# Patient Record
Sex: Male | Born: 1980 | Race: White | Hispanic: No | Marital: Single | Smoking: Former smoker
Health system: Southern US, Community
[De-identification: ages and names within clinical notes are randomized; demographics above are authoritative.]

## PROBLEM LIST (undated history)

## (undated) DIAGNOSIS — F329 Major depressive disorder, single episode, unspecified: Secondary | ICD-10-CM

## (undated) DIAGNOSIS — F32A Depression, unspecified: Secondary | ICD-10-CM

## (undated) DIAGNOSIS — K589 Irritable bowel syndrome without diarrhea: Secondary | ICD-10-CM

## (undated) DIAGNOSIS — K219 Gastro-esophageal reflux disease without esophagitis: Secondary | ICD-10-CM

## (undated) DIAGNOSIS — F988 Other specified behavioral and emotional disorders with onset usually occurring in childhood and adolescence: Secondary | ICD-10-CM

## (undated) DIAGNOSIS — T7840XA Allergy, unspecified, initial encounter: Secondary | ICD-10-CM

## (undated) DIAGNOSIS — L709 Acne, unspecified: Secondary | ICD-10-CM

## (undated) DIAGNOSIS — I10 Essential (primary) hypertension: Secondary | ICD-10-CM

## (undated) DIAGNOSIS — F909 Attention-deficit hyperactivity disorder, unspecified type: Secondary | ICD-10-CM

## (undated) DIAGNOSIS — F419 Anxiety disorder, unspecified: Secondary | ICD-10-CM

## (undated) DIAGNOSIS — F319 Bipolar disorder, unspecified: Secondary | ICD-10-CM

## (undated) HISTORY — DX: Irritable bowel syndrome, unspecified: K58.9

## (undated) HISTORY — DX: Allergy, unspecified, initial encounter: T78.40XA

## (undated) HISTORY — PX: WISDOM TOOTH EXTRACTION: SHX21

## (undated) HISTORY — DX: Essential (primary) hypertension: I10

## (undated) HISTORY — DX: Acne, unspecified: L70.9

## (undated) HISTORY — DX: Other specified behavioral and emotional disorders with onset usually occurring in childhood and adolescence: F98.8

---
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
## 2006-01-22 ENCOUNTER — Ambulatory Visit: Payer: Self-pay | Admitting: Family Medicine

## 2006-03-06 ENCOUNTER — Ambulatory Visit: Payer: Self-pay | Admitting: Family Medicine

## 2006-04-09 ENCOUNTER — Ambulatory Visit: Payer: Self-pay | Admitting: Family Medicine

## 2007-06-02 ENCOUNTER — Ambulatory Visit: Payer: Self-pay | Admitting: Family Medicine

## 2007-07-06 ENCOUNTER — Ambulatory Visit: Payer: Self-pay | Admitting: Family Medicine

## 2007-07-20 ENCOUNTER — Ambulatory Visit: Payer: Self-pay | Admitting: Family Medicine

## 2007-08-10 ENCOUNTER — Ambulatory Visit: Payer: Self-pay | Admitting: Family Medicine

## 2007-09-17 ENCOUNTER — Ambulatory Visit: Payer: Self-pay | Admitting: Family Medicine

## 2008-03-21 ENCOUNTER — Ambulatory Visit: Payer: Self-pay | Admitting: Family Medicine

## 2008-12-21 ENCOUNTER — Ambulatory Visit: Payer: Self-pay | Admitting: Family Medicine

## 2010-02-07 ENCOUNTER — Emergency Department (HOSPITAL_COMMUNITY): Admission: EM | Admit: 2010-02-07 | Discharge: 2010-02-07 | Payer: Self-pay | Admitting: Emergency Medicine

## 2010-06-17 ENCOUNTER — Emergency Department (HOSPITAL_COMMUNITY)
Admission: EM | Admit: 2010-06-17 | Discharge: 2010-06-17 | Payer: Self-pay | Source: Home / Self Care | Admitting: Emergency Medicine

## 2010-08-03 ENCOUNTER — Encounter: Payer: Self-pay | Admitting: Gastroenterology

## 2010-09-24 LAB — POCT I-STAT, CHEM 8
Creatinine, Ser: 1.1 mg/dL (ref 0.4–1.5)
Glucose, Bld: 80 mg/dL (ref 70–99)
Hemoglobin: 16.7 g/dL (ref 13.0–17.0)
Potassium: 3.8 mEq/L (ref 3.5–5.1)
TCO2: 26 mmol/L (ref 0–100)

## 2010-09-24 LAB — DIFFERENTIAL
Eosinophils Absolute: 0.2 10*3/uL (ref 0.0–0.7)
Eosinophils Relative: 1 % (ref 0–5)
Lymphs Abs: 3 10*3/uL (ref 0.7–4.0)
Monocytes Absolute: 1.1 10*3/uL — ABNORMAL HIGH (ref 0.1–1.0)
Monocytes Relative: 9 % (ref 3–12)

## 2010-09-24 LAB — URINALYSIS, ROUTINE W REFLEX MICROSCOPIC
Ketones, ur: >80 mg/dL — AB
Nitrite: NEGATIVE
pH: 6.5 (ref 5.0–8.0)

## 2010-09-24 LAB — RAPID URINE DRUG SCREEN, HOSP PERFORMED: Cocaine: NOT DETECTED

## 2010-09-24 LAB — BASIC METABOLIC PANEL
CO2: 24 mEq/L (ref 19–32)
Chloride: 98 mEq/L (ref 96–112)
GFR calc Af Amer: >60 mL/min (ref >60)
Sodium: 138 mEq/L (ref 135–145)

## 2010-09-24 LAB — CBC
Hemoglobin: 15.9 g/dL (ref 13.0–17.0)
MCH: 30.6 pg (ref 26.0–34.0)
MCV: 88.1 fL (ref 78.0–100.0)
RBC: 5.19 MIL/uL (ref 4.22–5.81)

## 2011-02-04 ENCOUNTER — Encounter: Payer: Self-pay | Admitting: Family Medicine

## 2011-02-06 ENCOUNTER — Ambulatory Visit (INDEPENDENT_AMBULATORY_CARE_PROVIDER_SITE_OTHER): Payer: BC Managed Care – PPO | Admitting: Family Medicine

## 2011-02-06 ENCOUNTER — Encounter: Payer: Self-pay | Admitting: Family Medicine

## 2011-02-06 VITALS — BP 124/80 | HR 78 | Wt 241.0 lb

## 2011-02-06 DIAGNOSIS — K219 Gastro-esophageal reflux disease without esophagitis: Secondary | ICD-10-CM

## 2011-02-06 DIAGNOSIS — L299 Pruritus, unspecified: Secondary | ICD-10-CM

## 2011-02-06 NOTE — Patient Instructions (Signed)
Use of Prilosec as needed to stretch it out as much as she can. If he requires use on a daily basis, then come back. Use cortisone cream for the itching in your left ear

## 2011-02-06 NOTE — Progress Notes (Signed)
  Subjective:    Patient ID: Herbert Bryant, male    DOB: [birth year], 30 y.o.   MRN: [id number]  HPI He has a several month history of itching in his left ear. No other areas itch. No pain or drainage, fever or chills, sore throat. He also has been having heartburn symptoms and has been taking Prilosec for the last 2 weeks . His reflux symptoms are now gone..   Review of Systems     Objective:   Physical Exam Alert and in no distress. The external canals bilaterally as well as external ear is normal. TMs normal. No preauricular nodes noted. Abdominal exam shows normal bowel sounds without masses or tenderness.       Assessment & Plan     pruritus of left ear right is of left ear   Pruritis of left ear. GERD. Recommend cortisone cream for the ear. Continue on Prilosec as needed. If continued difficulty, he will return here for reevaluation.

## 2011-05-06 IMAGING — CT CT HEAD W/O CM
1 series · 16 of 30 positions shown, 20 images · non-contrast
Comparison: None

CLINICAL DATA: Confusion.

CT HEAD WITHOUT CONTRAST
TECHNIQUE: Contiguous axial images were obtained from the base of
the skull through the vertex without contrast.

[Series 2: head_seq 4.5 h37s st · axial · 0.43mm/px · z∈[-127,+17]mm · 16 of 36 slices shown, 20 images]
[im 2/36  brain]
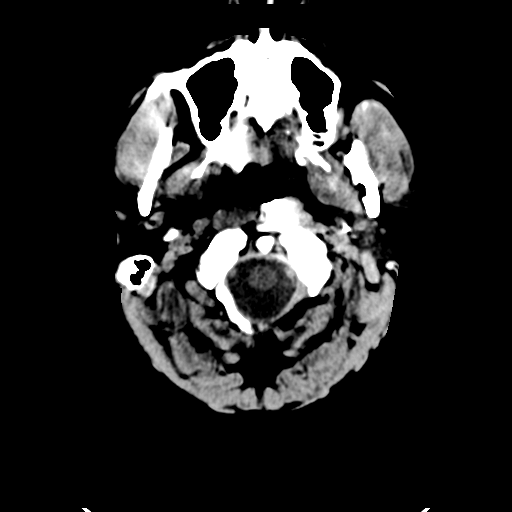
[im 2/36  bone]
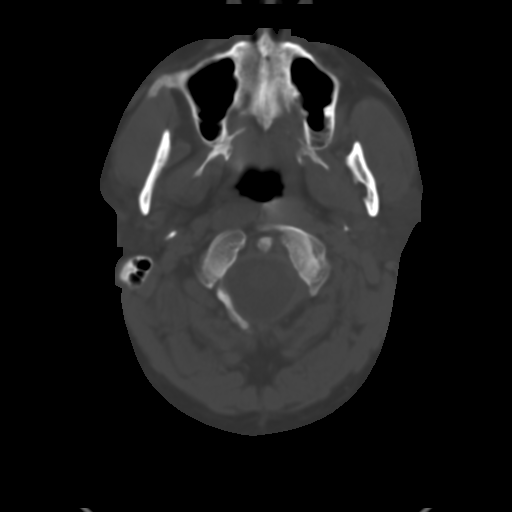
[im 4/36  brain]
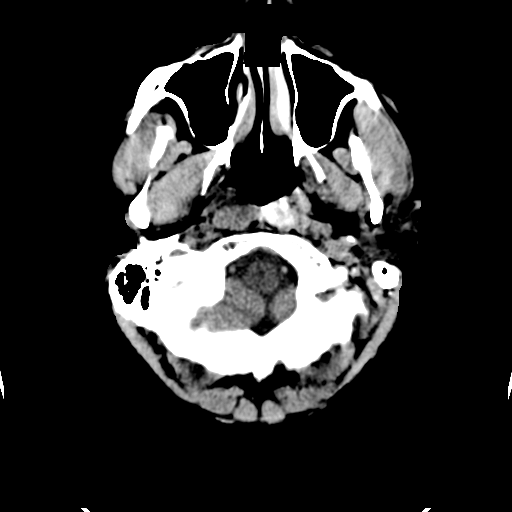
[im 7/36  brain]
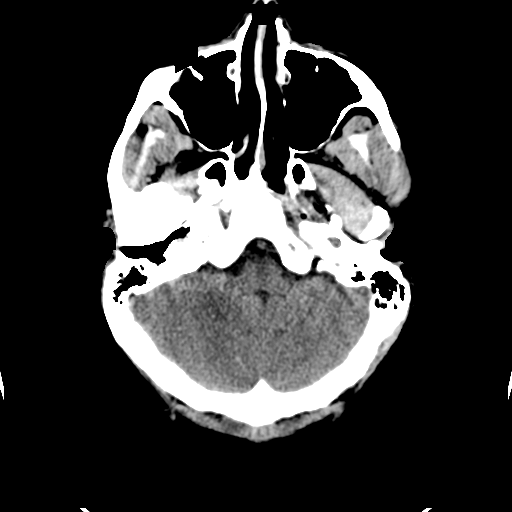
[im 9/36  brain]
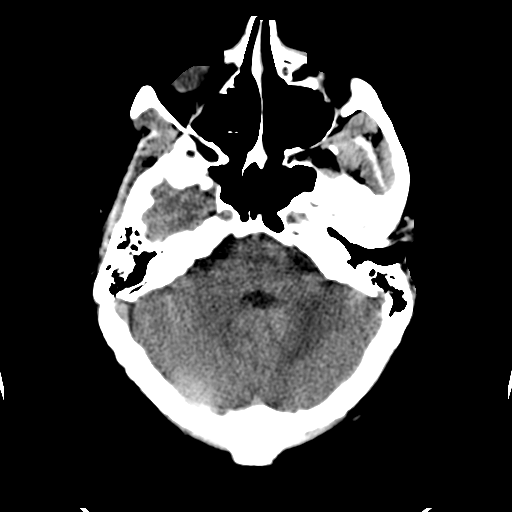
[im 10/36  brain]
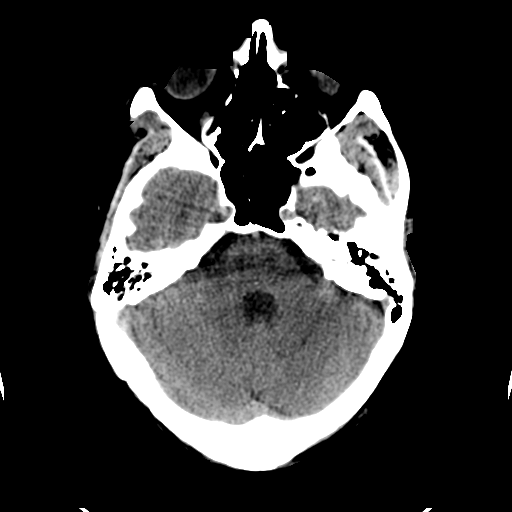
[im 10/36  bone]
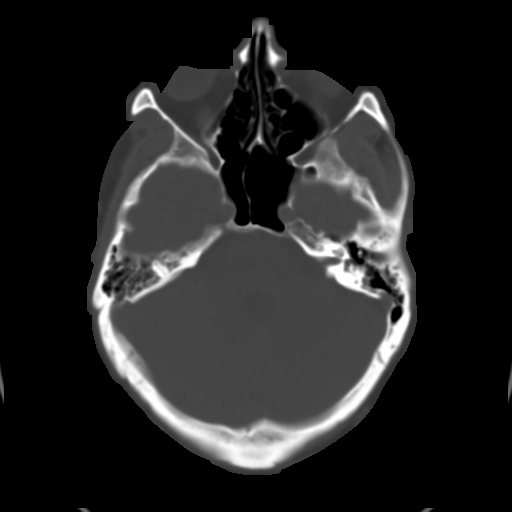
[im 13/36  brain]
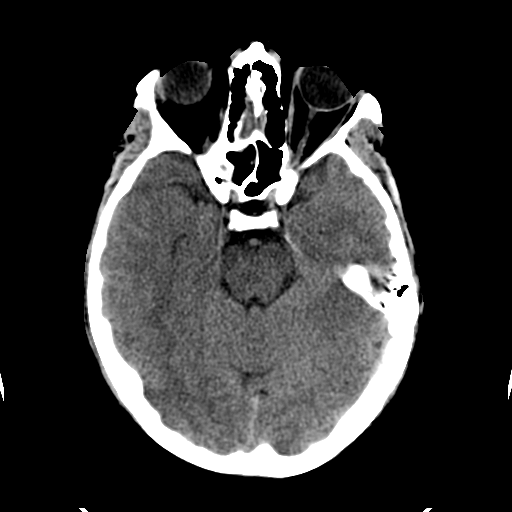
[im 15/36  brain]
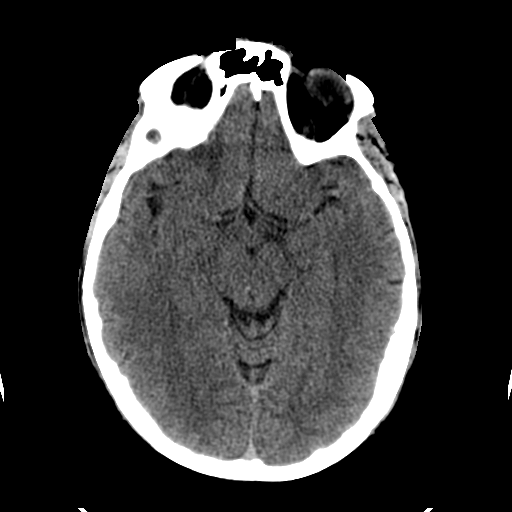
[im 17/36  brain]
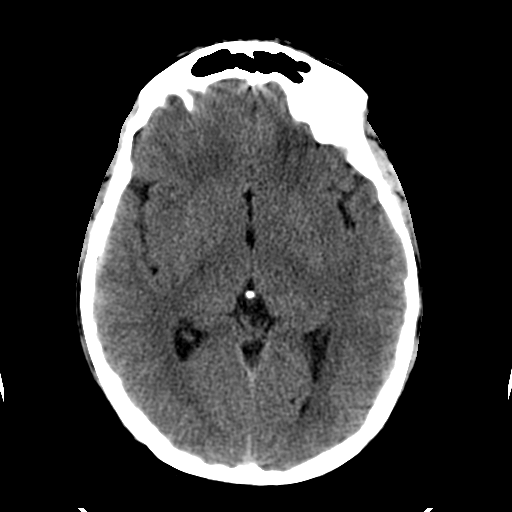
[im 19/36  brain]
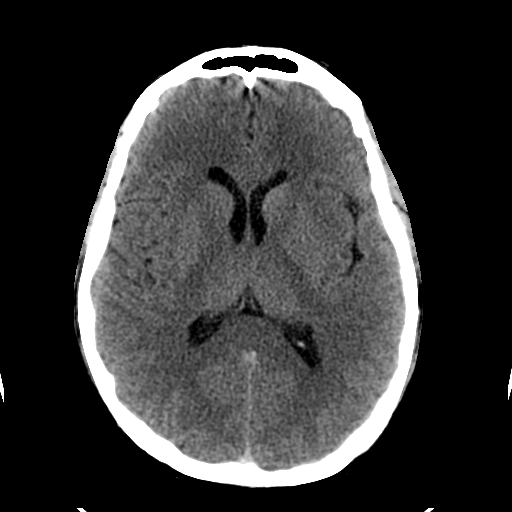
[im 19/36  bone]
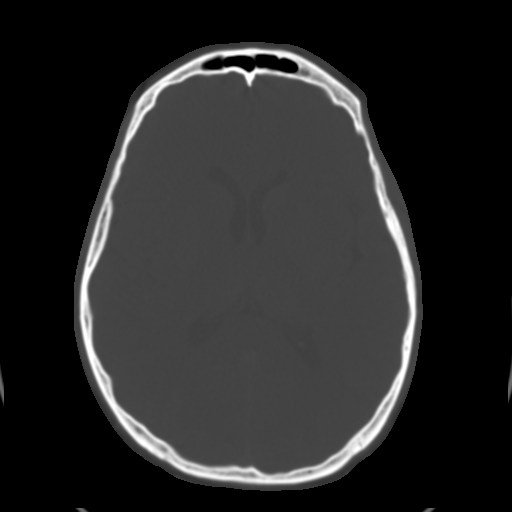
[im 21/36  brain]
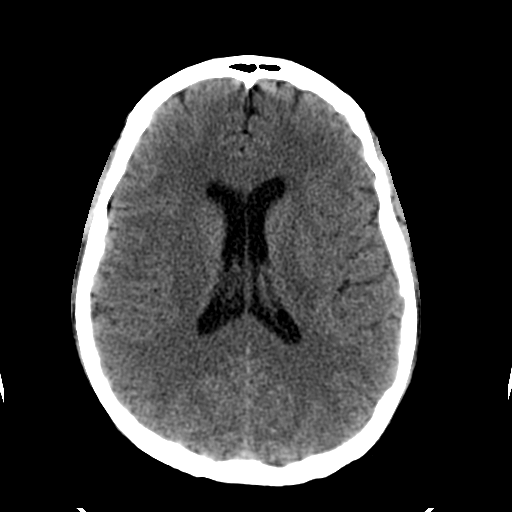
[im 23/36  brain]
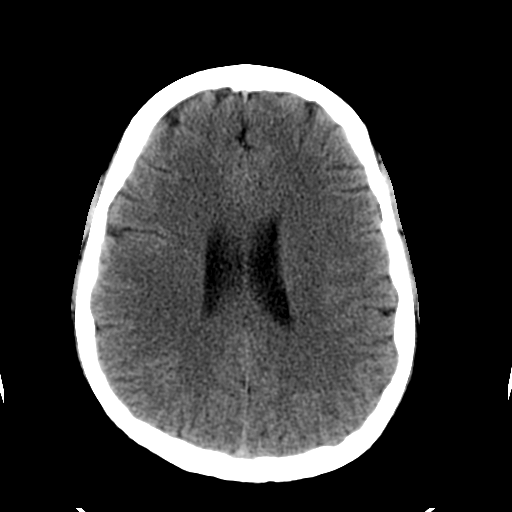
[im 26/36  brain]
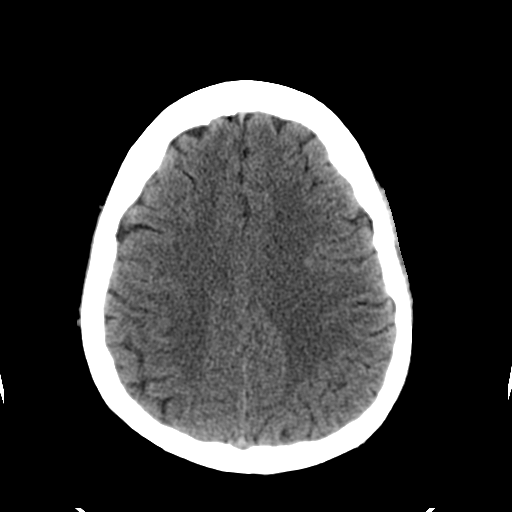
[im 27/36  brain]
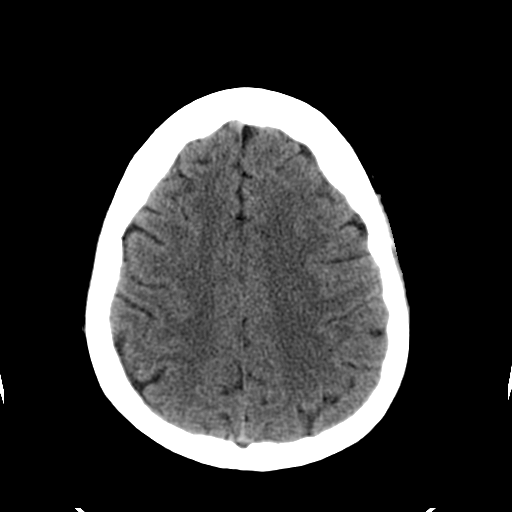
[im 27/36  bone]
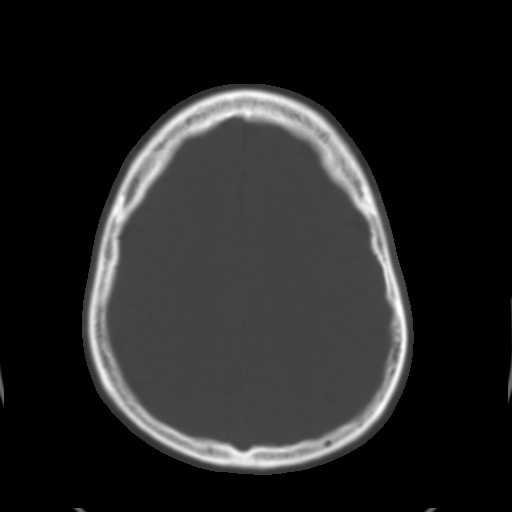
[im 29/36  brain]
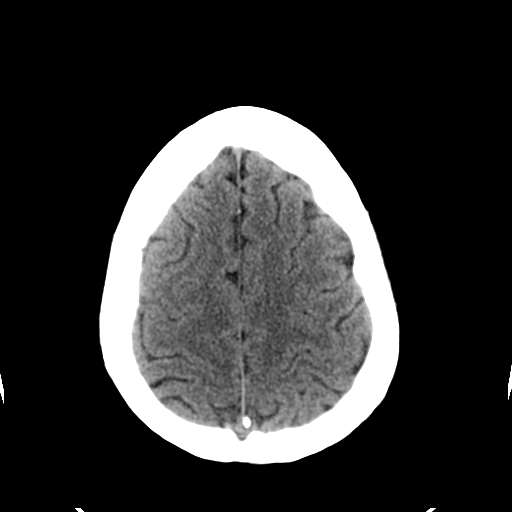
[im 32/36  brain]
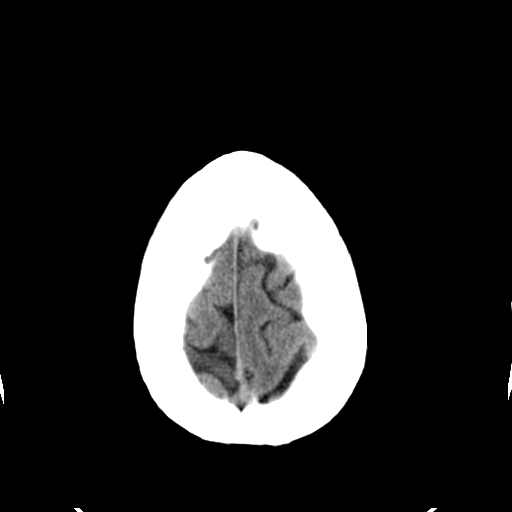
[im 34/36  brain]
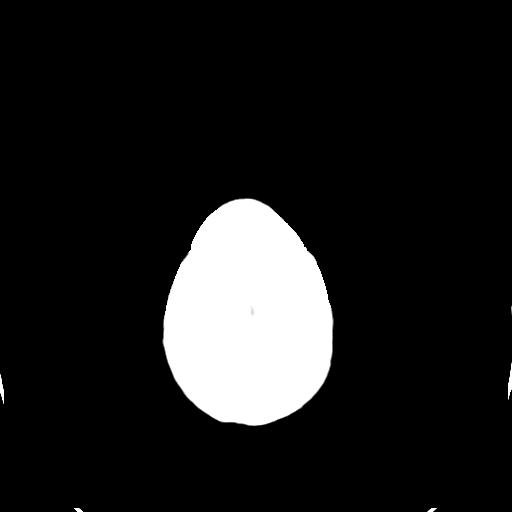

[16 of 30 positions shown; findings below may reference images not displayed]

FINDINGS: Ventricle size is normal.  Negative for hemorrhage.
Negative for infarct or mass lesion.  Negative for brain edema.
Calvarium is intact.
IMPRESSION: Normal

## 2011-05-29 ENCOUNTER — Encounter (HOSPITAL_COMMUNITY): Payer: Self-pay | Admitting: Emergency Medicine

## 2011-05-29 ENCOUNTER — Emergency Department (HOSPITAL_COMMUNITY)
Admission: EM | Admit: 2011-05-29 | Discharge: 2011-05-29 | Disposition: A | Discharge status: Home / Self Care | Payer: BC Managed Care – PPO | Attending: Emergency Medicine | Admitting: Emergency Medicine

## 2011-05-29 DIAGNOSIS — Z79899 Other long term (current) drug therapy: Secondary | ICD-10-CM | POA: Insufficient documentation

## 2011-05-29 DIAGNOSIS — R000 Tachycardia, unspecified: Secondary | ICD-10-CM

## 2011-05-29 DIAGNOSIS — F988 Other specified behavioral and emotional disorders with onset usually occurring in childhood and adolescence: Secondary | ICD-10-CM | POA: Insufficient documentation

## 2011-05-29 DIAGNOSIS — R079 Chest pain, unspecified: Secondary | ICD-10-CM | POA: Insufficient documentation

## 2011-05-29 DIAGNOSIS — K589 Irritable bowel syndrome without diarrhea: Secondary | ICD-10-CM | POA: Insufficient documentation

## 2011-05-29 DIAGNOSIS — R209 Unspecified disturbances of skin sensation: Secondary | ICD-10-CM | POA: Insufficient documentation

## 2011-05-29 HISTORY — DX: Attention-deficit hyperactivity disorder, unspecified type: F90.9

## 2011-05-29 LAB — RAPID URINE DRUG SCREEN, HOSP PERFORMED
Barbiturates: NOT DETECTED
Benzodiazepines: NOT DETECTED

## 2011-05-29 MED ORDER — METOPROLOL TARTRATE 25 MG PO TABS: 25.0000 mg | ORAL_TABLET | Freq: Two times a day (BID) | ORAL | Status: DC | PRN

## 2011-05-29 MED ORDER — SODIUM CHLORIDE 0.9 % IV BOLUS (SEPSIS)
500.0000 mL | Freq: Once | INTRAVENOUS | Status: AC
  Administered 2011-05-29: 500 mL via INTRAVENOUS

## 2011-05-29 MED ORDER — SODIUM CHLORIDE 0.9 % IV BOLUS (SEPSIS)
1000.0000 mL | Freq: Once | INTRAVENOUS | Status: AC
  Administered 2011-05-29: 1000 mL via INTRAVENOUS

## 2011-05-29 NOTE — ED Notes (Signed)
Patient states that he is not interested in having blood drawn right now

## 2011-05-29 NOTE — ED Notes (Signed)
Father asked for a mouth swab for patient. Informed the father that we will bring him a mouth swab and draw his blood. Father states that he wanted the swab but we will NOT draw his blood.

## 2011-05-29 NOTE — ED Provider Notes (Signed)
Medical screening examination/treatment/procedure(s) were conducted as a shared visit with the resident physician and myself.  I personally evaluated the patient during the encounter and find him to have a normal appearance, normal affect, alert and oriented appropriately, with no respiratory distress, clear lung sounds in all fields, normal heart rate and rhythm with normal heart sounds, no murmurs, rubs, or gallops.  I have reviewed the resident's note and agree with the findings and plan.  Felisa Bonier, MD 05/29/11 [phone]

## 2011-05-29 NOTE — ED Notes (Signed)
Informed Dr Dorise Hiss that the patient will not let us obtain blood or urine and she states that it was ok

## 2011-05-29 NOTE — ED Notes (Signed)
Attempted to obtain blood and urine from pt again. Pt refused urinal and catheter and refuses to allow nurse to stick for blood again.

## 2011-05-29 NOTE — ED Provider Notes (Signed)
History     CSN: [id number] Arrival date & time: 05/29/2011  8:56 AM   First MD Initiated Contact with Patient 05/29/11 0901      Chief Complaint  Patient presents with  . Chest Pain    (Consider location/radiation/quality/duration/timing/severity/associated sxs/prior treatment) Patient is a 30 y.o. male presenting with chest pain. The history is provided by the patient and a parent. The history is limited by the condition of the patient.  Chest Pain The chest pain began yesterday. Chest pain occurs intermittently. The chest pain is unchanged. The pain is associated with drug use (Pt took vyvance yesterday and was drinking beer, this morning took some xanax.). At its most intense, the pain is at 4/10. The pain is currently at 1/10. The quality of the pain is described as similar to previous episodes (palpatations). The pain does not radiate. Primary symptoms include palpitations. Pertinent negatives for primary symptoms include no fever, no fatigue, no syncope, no shortness of breath, no cough, no wheezing, no abdominal pain, no nausea, no vomiting and no dizziness. Primary symptoms comment: decreased level of concern but alert and oriented time 3  The palpitations did not occur with syncope, dizziness or shortness of breath.  Associated symptoms include diaphoresis and numbness.  Pertinent negatives for associated symptoms include no claudication, no lower extremity edema, no near-syncope, no orthopnea, no paroxysmal nocturnal dyspnea and no weakness. Associated symptoms comments: Having some numbness in his L hand.. Risk factors include alcohol intake (drug use).  Pertinent negatives for past medical history include no CAD, no congenital heart disease and no seizures.  Pertinent negatives for family medical history include: no CAD in family.     Past Medical History  Diagnosis Date  . Allergy   . IBS (irritable bowel syndrome)   . Acne   . ADD (attention deficit disorder)   . ADHD  (attention deficit hyperactivity disorder)     History reviewed. No pertinent past surgical history.  Family History  Problem Relation Age of Onset  . Arthritis Maternal Grandmother   . Stroke Maternal Grandmother   . Hypertension Maternal Grandmother   . Arthritis Maternal Grandfather   . Emphysema Paternal Grandfather     History  Substance Use Topics  . Smoking status: Current Some Day Smoker -- 0.5 packs/day    Types: Cigarettes  . Smokeless tobacco: Never Used  . Alcohol Use: 4.2 oz/week    7 Shots of liquor per week      Review of Systems  Constitutional: Positive for diaphoresis. Negative for fever, chills, activity change, appetite change, fatigue and unexpected weight change.  Respiratory: Negative for cough, choking, chest tightness, shortness of breath, wheezing and stridor.   Cardiovascular: Positive for chest pain and palpitations. Negative for orthopnea, claudication, leg swelling, syncope and near-syncope.  Gastrointestinal: Negative for nausea, vomiting, abdominal pain, diarrhea, constipation and abdominal distention.  Neurological: Positive for numbness. Negative for dizziness, tremors, seizures, syncope, facial asymmetry, speech difficulty, weakness, light-headedness and headaches.  Psychiatric/Behavioral: Negative for suicidal ideas, hallucinations, behavioral problems, confusion, sleep disturbance, self-injury, dysphoric mood, decreased concentration and agitation. The patient is not nervous/anxious and is not hyperactive.        Pt has slightly lower level of consciousness and is unconcerned. He is responsive and alert and oriented times 3.   All other systems reviewed and are negative.    Allergies  Review of patient's allergies indicates no known allergies.  Home Medications   Current Outpatient Rx  Name Route Sig Dispense Refill  .  ALPRAZOLAM 0.5 MG PO TABS Oral Take 0.25 mg by mouth 3 (three) times daily as needed. Anxiety     . LISDEXAMFETAMINE  DIMESYLATE 20 MG PO CAPS Oral Take 20 mg by mouth every morning.     Marland Kitchen QUETIAPINE FUMARATE 25 MG PO TABS Oral Take 25 mg by mouth at bedtime.      . FLUOXETINE HCL 20 MG PO CAPS Oral Take 20 mg by mouth daily.      Marland Kitchen LAMOTRIGINE 200 MG PO TABS Oral Take 200 mg by mouth daily.      Marland Kitchen MIRTAZAPINE 30 MG PO TABS Oral Take 30 mg by mouth at bedtime.      . OMEPRAZOLE 20 MG PO CPDR Oral Take 20 mg by mouth daily.        BP 144/87  Pulse 107  Resp 19  Ht 5\' 10"  (1.778 m)  Wt 245 lb (111.131 kg)  BMI 35.15 kg/m2  SpO2 100%  Physical Exam  Constitutional: He is oriented to person, place, and time. He appears well-developed and well-nourished.  HENT:  Head: Normocephalic and atraumatic.  Eyes: EOM are normal. Pupils are equal, round, and reactive to light.  Neck: Normal range of motion. Neck supple.  Cardiovascular: Regular rhythm.        tachycardia  Pulmonary/Chest: Effort normal and breath sounds normal. No respiratory distress. He has no wheezes. He has no rales. He exhibits tenderness.  Abdominal: Soft. Bowel sounds are normal. He exhibits no distension. There is no tenderness. There is no rebound.  Neurological: He is alert and oriented to person, place, and time. No cranial nerve deficit.  Skin: Skin is warm.    ED Course  Procedures (including critical care time)   Labs Reviewed  URINE RAPID DRUG SCREEN (HOSP PERFORMED)   No results found.   1. Tachycardia       MDM  Pt admits to taking vyvance and alcohol and xanax in the last day. He took the vyvance yesterday with some beers and took some xanax this morning. He is having some tachycardia and numbness. We will check a urine sample for drug screen and give 1 L bolus of fluids to hydrate him. I-stat is ordered however the pt is refusing blood draws at this time. We will continue to evaluate for symptoms. EKG taken as well.   11:33 AM Pt seen and re-evaluated after fluids. HR down to 90s and palpitations are much less  frequent. Pt is requesting something to eat and drink and more fluids. Will give 500 mL additional and pt will be re-evaluated for discharge.    12:23 PM Re-evaluated the pt and deemed stable for discharge. Discussed plan with the pt and advised him not to combine his medications in that manner again.  Genella Mech, MD 05/29/11 1224

## 2011-05-29 NOTE — ED Notes (Signed)
Pt admits that he also took a 5 hour energy at about 1am this morning.

## 2011-05-29 NOTE — ED Notes (Signed)
Pt's father Renae Fickle [phone] will be back to pick pt up when ready

## 2011-05-31 NOTE — ED Provider Notes (Signed)
  I performed a history and physical examination of Herbert Bryant and discussed his management with Dr. Dorise Hiss.  I agree with the history, physical, assessment, and plan of care, with the following exceptions: None  I was present for the following procedures: None Time Spent in Critical Care of the patient: None Time spent in discussions with the patient and family: 5 mins  Derrion Tritz,Sonnie D    Felisa Bonier, MD 05/31/11 1450

## 2011-09-23 ENCOUNTER — Emergency Department (HOSPITAL_COMMUNITY)
Admission: EM | Admit: 2011-09-23 | Discharge: 2011-09-23 | Disposition: A | Discharge status: Home / Self Care | Payer: BC Managed Care – PPO | Attending: Emergency Medicine | Admitting: Emergency Medicine

## 2011-09-23 ENCOUNTER — Encounter (HOSPITAL_COMMUNITY): Payer: Self-pay | Admitting: Emergency Medicine

## 2011-09-23 DIAGNOSIS — R197 Diarrhea, unspecified: Secondary | ICD-10-CM | POA: Insufficient documentation

## 2011-09-23 DIAGNOSIS — F909 Attention-deficit hyperactivity disorder, unspecified type: Secondary | ICD-10-CM | POA: Insufficient documentation

## 2011-09-23 DIAGNOSIS — F319 Bipolar disorder, unspecified: Secondary | ICD-10-CM | POA: Insufficient documentation

## 2011-09-23 DIAGNOSIS — R112 Nausea with vomiting, unspecified: Secondary | ICD-10-CM

## 2011-09-23 DIAGNOSIS — F17200 Nicotine dependence, unspecified, uncomplicated: Secondary | ICD-10-CM | POA: Insufficient documentation

## 2011-09-23 HISTORY — DX: Bipolar disorder, unspecified: F31.9

## 2011-09-23 LAB — CBC
HCT: 42.3 % (ref 39.0–52.0)
RDW: 12.8 % (ref 11.5–15.5)
WBC: 10 10*3/uL (ref 4.0–10.5)

## 2011-09-23 LAB — BASIC METABOLIC PANEL
BUN: 14 mg/dL (ref 6–23)
Chloride: 100 mEq/L (ref 96–112)
GFR calc Af Amer: >90 mL/min (ref >90)
Potassium: 3.2 mEq/L — ABNORMAL LOW (ref 3.5–5.1)

## 2011-09-23 MED ORDER — ONDANSETRON 8 MG PO TBDP: 8.0000 mg | ORAL_TABLET | Freq: Three times a day (TID) | ORAL | Status: AC | PRN

## 2011-09-23 MED ORDER — SODIUM CHLORIDE 0.9 % IV BOLUS (SEPSIS)
1000.0000 mL | Freq: Once | INTRAVENOUS | Status: AC
  Administered 2011-09-23: 1000 mL via INTRAVENOUS

## 2011-09-23 MED ORDER — ONDANSETRON HCL 4 MG/2ML IJ SOLN
4.0000 mg | Freq: Once | INTRAMUSCULAR | Status: AC
  Administered 2011-09-23: 4 mg via INTRAVENOUS
  Filled 2011-09-23: qty 2

## 2011-09-23 MED ORDER — POTASSIUM CHLORIDE CRYS ER 20 MEQ PO TBCR
40.0000 meq | EXTENDED_RELEASE_TABLET | Freq: Once | ORAL | Status: AC
  Administered 2011-09-23: 40 meq via ORAL
  Filled 2011-09-23: qty 2

## 2011-09-23 NOTE — ED Provider Notes (Signed)
History     CSN: [id number]  Arrival date & time 09/23/11  1308   First MD Initiated Contact with Patient 09/23/11 0410      Chief Complaint  Patient presents with  . Emesis  . Nausea  . Diarrhea    The history is provided by the patient.   the patient reports approximately 20 hours of nausea vomiting diarrhea.  He denies melena and hematochezia.  He denies fevers or chills.  He has no recent sick contacts.  His been unable to tolerate oral fluids today.  Nothing worsens the symptoms.  Nothing improves his symptoms.  His symptoms are constant.  He denies chest pain shortness of breath.  He denies coffee ground emesis  Past Medical History  Diagnosis Date  . Allergy   . IBS (irritable bowel syndrome)   . Acne   . ADD (attention deficit disorder)   . ADHD (attention deficit hyperactivity disorder)   . Bipolar 1 disorder     History reviewed. No pertinent past surgical history.  Family History  Problem Relation Age of Onset  . Arthritis Maternal Grandmother   . Stroke Maternal Grandmother   . Hypertension Maternal Grandmother   . Arthritis Maternal Grandfather   . Emphysema Paternal Grandfather     History  Substance Use Topics  . Smoking status: Current Some Day Smoker -- 0.5 packs/day    Types: Cigarettes  . Smokeless tobacco: Never Used  . Alcohol Use: 4.2 oz/week    7 Shots of liquor per week     occassional      Review of Systems  Gastrointestinal: Positive for vomiting and diarrhea.  All other systems reviewed and are negative.    Allergies  Review of patient's allergies indicates no known allergies.  Home Medications   Current Outpatient Rx  Name Route Sig Dispense Refill  . ALPRAZOLAM 0.5 MG PO TABS Oral Take 0.25-0.5 mg by mouth 3 (three) times daily as needed. Anxiety     . ASPIRIN EC 81 MG PO TBEC Oral Take 81 mg by mouth daily.      Marland Kitchen CALCIUM CARBONATE ANTACID 500 MG PO CHEW Oral Chew 2 tablets by mouth daily.      Marland Kitchen VITAMIN D 1000 UNITS PO  TABS Oral Take 1,000 Units by mouth daily.      . OMEGA-3 FATTY ACIDS 1000 MG PO CAPS Oral Take 1 g by mouth 2 (two) times daily.      Marland Kitchen GREEN TEA 150 MG PO CAPS Oral Take 1 capsule by mouth daily.      Marland Kitchen LAMOTRIGINE 200 MG PO TABS Oral Take 200 mg by mouth daily.      Marland Kitchen LOPERAMIDE HCL 2 MG PO CAPS Oral Take 2 mg by mouth 4 (four) times daily as needed. DIAHHRHEA     . THERA M PLUS PO TABS Oral Take 1 tablet by mouth daily.      Marland Kitchen NAPROXEN SODIUM 220 MG PO TABS Oral Take 220 mg by mouth 2 (two) times daily with a meal. FOR PAIN     . OMEPRAZOLE 20 MG PO CPDR Oral Take 20 mg by mouth daily.      . QUETIAPINE FUMARATE 25 MG PO TABS Oral Take 25 mg by mouth at bedtime.      . TRAZODONE HCL 100 MG PO TABS Oral Take 50 mg by mouth at bedtime. DOES NOT TAKE IF HE TAKES SEROQUEL     . ONDANSETRON 8 MG PO TBDP Oral Take 1  tablet (8 mg total) by mouth every 8 (eight) hours as needed for nausea. 12 tablet 0    BP 131/70  Pulse 94  Temp(Src) 97.1 F (36.2 C) (Oral)  Resp 18  SpO2 100%  Physical Exam  Nursing note and vitals reviewed. Constitutional: He is oriented to person, place, and time. He appears well-developed and well-nourished.  HENT:  Head: Normocephalic and atraumatic.  Eyes: EOM are normal.  Neck: Normal range of motion.  Cardiovascular: Normal rate, regular rhythm, normal heart sounds and intact distal pulses.   Pulmonary/Chest: Effort normal and breath sounds normal. No respiratory distress.  Abdominal: Soft. He exhibits no distension. There is no tenderness.  Musculoskeletal: Normal range of motion.  Neurological: He is alert and oriented to person, place, and time.  Skin: Skin is warm and dry.  Psychiatric: He has a normal mood and affect. Judgment normal.    ED Course  Procedures (including critical care time)  Labs Reviewed  BASIC METABOLIC PANEL - Abnormal; Notable for the following:    Potassium 3.2 (*)    All other components within normal limits  CBC   No  results found.   1. Nausea vomiting and diarrhea       MDM  Hydrated in the emergency department.  I suspect this is a viral gastroenteritis.  Tolerating oral fluids in the ER.  DC home in good condition.  Patient reports it feels much better after fluids.        Lyanne Co, MD 09/23/11 [phone]

## 2011-09-23 NOTE — ED Notes (Signed)
Pt states he has had N/V/D since about 0930 yesterday morning  Pt states he has been unable to keep anything down and feels very dehydrated

## 2011-09-24 ENCOUNTER — Ambulatory Visit (INDEPENDENT_AMBULATORY_CARE_PROVIDER_SITE_OTHER): Payer: BC Managed Care – PPO | Admitting: Medical

## 2011-09-24 ENCOUNTER — Encounter: Payer: Self-pay | Admitting: Medical

## 2011-09-24 VITALS — BP 120/80 | HR 88 | Temp 98.3°F | Resp 16 | Wt 263.0 lb

## 2011-09-24 DIAGNOSIS — R112 Nausea with vomiting, unspecified: Secondary | ICD-10-CM

## 2011-09-24 DIAGNOSIS — R197 Diarrhea, unspecified: Secondary | ICD-10-CM

## 2011-09-24 MED ORDER — PROMETHAZINE HCL 25 MG PO TABS: ORAL_TABLET | ORAL | Status: DC

## 2011-09-24 NOTE — Progress Notes (Signed)
Subjective: Here today for hospital f/u on possible food poisoning or norovirus.  Went to [facility] ED on 09/23/11 for nausea vomiting and diarrhea.   Today he notes nausea, but vomiting has settled down some.  Still has diarrhea, 2-3x/hour today.  Started 9:30am 3 days ago with diarrhea, nausea and vomiting.   Having diarrhea several times and hour the last 3 days.  He notes that today the diarrhea seems only slightly better since he isn't eating anything.  He is drinking lots of Pedialyte.  Was given 2L IV fluids at the ED.  Was discharged home and advised to f/u here.    Denies fever, no recent antibiotics, no recent travel, no animal exposure, no recent camping.   He does note that one of his friends had similar symptoms recently, but he had no direct contact with her.  He does report 3am on the first day of symptoms, he had eaten some buffalo chicken wings, and awoke sick at 9am that morning.   Past Medical History  Diagnosis Date  . Allergy   . IBS (irritable bowel syndrome)   . Acne   . ADD (attention deficit disorder)   . ADHD (attention deficit hyperactivity disorder)   . Bipolar 1 disorder    ROS as noted in HPI    Objective:   Physical Exam  Filed Vitals:   09/24/11 1344  BP: 120/80  Pulse: 88  Temp: 98.3 F (36.8 C)  Resp: 16    General appearance: alert, no distress, WD/WN  Oral cavity: MMM, no lesions Neck: supple, no lymphadenopathy, no thyromegaly, no masses Heart: RRR, normal S1, S2, no murmurs Lungs: CTA bilaterally, no wheezes, rhonchi, or rales Abdomen: +borborygmi, soft, non tender, non distended, no masses, no hepatomegaly, no splenomegaly Pulses: 2+ symmetric   Assessment and Plan :    Encounter Diagnoses  Name Primary?  . Diarrhea Yes  . Nausea & vomiting    I reviewed the ED note.  Script for promethazine since Zofran not helping, but advised caution due to sedation in conjunction with his other medications.  Can start with 1/2 tablet q6 hr prn  for NV.  Rest, discussed need for hydration to keep from getting dehydrated.   Gave note for school.  Discussed usual course of illness.  If not improving much by Friday, will need to get stool studies.

## 2012-07-21 ENCOUNTER — Encounter: Payer: Self-pay | Admitting: Family Medicine

## 2012-07-21 ENCOUNTER — Ambulatory Visit (INDEPENDENT_AMBULATORY_CARE_PROVIDER_SITE_OTHER): Payer: BC Managed Care – PPO | Admitting: Family Medicine

## 2012-07-21 VITALS — BP 120/70 | HR 93 | Wt 278.0 lb

## 2012-07-21 DIAGNOSIS — E669 Obesity, unspecified: Secondary | ICD-10-CM

## 2012-07-21 DIAGNOSIS — F32A Depression, unspecified: Secondary | ICD-10-CM

## 2012-07-21 DIAGNOSIS — F329 Major depressive disorder, single episode, unspecified: Secondary | ICD-10-CM

## 2012-07-21 DIAGNOSIS — J309 Allergic rhinitis, unspecified: Secondary | ICD-10-CM

## 2012-07-21 DIAGNOSIS — L299 Pruritus, unspecified: Secondary | ICD-10-CM

## 2012-07-21 DIAGNOSIS — K219 Gastro-esophageal reflux disease without esophagitis: Secondary | ICD-10-CM

## 2012-07-21 MED ORDER — FLUTICASONE PROPIONATE 50 MCG/ACT NA SUSP: 2.0000 | Freq: Every day | NASAL | Status: DC

## 2012-07-21 NOTE — Progress Notes (Signed)
  Subjective:    Patient ID: Herbert Bryant, male    DOB: April 11, 1981, 32 y.o.   MRN: [id number]  HPI He is here for evaluation of multiple issues. He complains of a sore present on the left second toe has been there for several weeks and does not seem to be healing properly. 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 He has no sneezing, itchy watery eyes but mainly just nasal congestion. He has slowly been gaining weight and blames this on depression. He is being followed by Valinda Hoar. He also complains of intermittent difficulty with nausea and has used antinausea medicines in the past. He has a history of reflux and presently is taking 20 mg of Prilosec. He also complains of itching in the left ear but does respond to topical steroids. He would like compression stockings because he is getting swelling and blames this on sitting around With nothing to do. He blames this on his depression.   Review of Systems     Objective:   Physical Exam Alert and in no distress with a relatively normal affect. Exam of his left toes does show what appears to be healing lesion on the second toe. Exam of the left ear shows normal ear and canal.       Assessment & Plan:   1. Allergic rhinitis, mild  fluticasone (FLONASE) 50 MCG/ACT nasal spray  2. GERD (gastroesophageal reflux disease)    3. Depression    4. Obesity (BMI 30-39.9)    5. Pruritus     I did write a prescription for compression stockings. Also discussed the fact that he needs to get more physically active. We'll place him on Flonase to help with his congestion to help get him off the Afrin. Recommend he continue to use the topical steroid to help with the itching in his left ear. Recommend he increase the Prilosec to 40 mg and see what this does help the nausea. We might need to pursue the nausea evaluation further at a later date.

## 2012-07-21 NOTE — Patient Instructions (Signed)
Double the Prilosec to see what that'll do to help with the nausea. Stay on a Flonase and get off the Afrin entirely if that doesn't work let me know there are other nasal sprays we can give you

## 2013-06-27 ENCOUNTER — Ambulatory Visit (INDEPENDENT_AMBULATORY_CARE_PROVIDER_SITE_OTHER): Payer: BC Managed Care – PPO | Admitting: Family Medicine

## 2013-06-27 VITALS — BP 130/84 | Wt 302.0 lb

## 2013-06-27 DIAGNOSIS — R000 Tachycardia, unspecified: Secondary | ICD-10-CM

## 2013-06-27 DIAGNOSIS — R232 Flushing: Secondary | ICD-10-CM

## 2013-06-27 LAB — COMPREHENSIVE METABOLIC PANEL
Albumin: 4.6 g/dL (ref 3.5–5.2)
BUN: 14 mg/dL (ref 6–23)
CO2: 29 mEq/L (ref 19–32)
Calcium: 9.9 mg/dL (ref 8.4–10.5)
Chloride: 101 mEq/L (ref 96–112)
Glucose, Bld: 97 mg/dL (ref 70–99)
Potassium: 3.9 mEq/L (ref 3.5–5.3)
Total Protein: 7.4 g/dL (ref 6.0–8.3)

## 2013-06-27 LAB — CBC WITH DIFFERENTIAL/PLATELET
HCT: 42.5 % (ref 39.0–52.0)
Hemoglobin: 14.6 g/dL (ref 13.0–17.0)
Lymphocytes Relative: 33 % (ref 12–46)
Lymphs Abs: 2.6 10*3/uL (ref 0.7–4.0)
MCHC: 34.4 g/dL (ref 30.0–36.0)
Monocytes Absolute: 0.7 10*3/uL (ref 0.1–1.0)
Monocytes Relative: 8 % (ref 3–12)
Neutro Abs: 4.3 10*3/uL (ref 1.7–7.7)
RBC: 5 MIL/uL (ref 4.22–5.81)
WBC: 7.9 10*3/uL (ref 4.0–10.5)

## 2013-06-27 NOTE — Progress Notes (Signed)
   Subjective:    Patient ID: Herbert Bryant, male    DOB: 01-17-1981, 32 y.o.   MRN: [id number]  HPI He is here for evaluation of a one-year history of intermittent feeling of a flushed hot sensation inas well as tingling in his fingers but no shortness of breath associated with this. He sometimes can get this when he is in a stressful situation or drinking caffeine. He is also noted this was nothing in particular is going on. He has checked his pulse during that time and states that it was 110 and slightly irregular. The symptoms can last roughly 20 minutes. He was seen twice in the emergency room and apparently EKGs were negative. Of note is the fact that his mother is now in hospice dying of liver failure. He seems relatively resigned to that fact.   Review of Systems     Objective:   Physical Exam alert and in no distress. DTRs difficult to do due to his size but appeared normal Tympanic membranes and canals are normal. Throat is clear. Tonsils are normal. Neck is supple without adenopathy or thyromegaly. Cardiac exam shows a regular sinus rhythm without murmurs or gallops. Lungs are clear to auscultation.        Assessment & Plan:  Tachycardia - Plan: CBC with Differential, Comprehensive metabolic panel, TSH  Skin flushed - Plan: CBC with Differential, Comprehensive metabolic panel, TSH  I discussed the fact that since it has been going on for over a year, this speaks against anything of major since it would probably a very manifested itself. Also he has been seen in the emergency room when he has had this and therefore no cardiac manifestation was visible. Discussed that if his blood work is normal, we will take a watchful waiting approach to this and have him be vigilant this to any other changes in his symptom complex.

## 2013-07-19 ENCOUNTER — Encounter: Payer: Self-pay | Admitting: Family Medicine

## 2013-07-19 ENCOUNTER — Ambulatory Visit (INDEPENDENT_AMBULATORY_CARE_PROVIDER_SITE_OTHER): Payer: BC Managed Care – PPO | Admitting: Family Medicine

## 2013-07-19 VITALS — BP 142/90 | HR 97 | Wt 309.0 lb

## 2013-07-19 DIAGNOSIS — R000 Tachycardia, unspecified: Secondary | ICD-10-CM

## 2013-07-19 NOTE — Progress Notes (Signed)
   Subjective:    Patient ID: Herbert GuernseyMichael O Bryant, male    DOB: September 14, 1980, 33 y.o.   MRN: [id number]  HPI Yesterday while at dinner he had a feeling of being out of touch usually followed by a flush sensation to his face and tingling sensation in his fingers. He states that he feels his heart rate was elevated but did not check the pulse. The symptoms lasted roughly 10 minutes. He says that he get these symptoms daily. In the past he has checked his pulse rate and it has been in the 100 to 120 range. He has been evaluated in the emergency room most recently being in 2013. He continues on his psychotropic medications and is followed by Herbert Bryant   Review of Systems     Objective:   Physical Exam alert and in no distress. Tympanic membranes and canals are normal. Throat is clear. Tonsils are normal. Neck is supple without adenopathy or thyromegaly. Cardiac exam shows a regular sinus rhythm without murmurs or gallops. Lungs are clear to auscultation. EKG shows no changes.       Assessment & Plan:  Tachycardia - Plan: EKG 12-Lead  I explained since nothing has been found medically that most likely his symptoms are related to panic even no he seems to have these issues when he is not necessarily in the panic mode. Recommend he discuss this with his therapist.

## 2013-12-14 ENCOUNTER — Ambulatory Visit (INDEPENDENT_AMBULATORY_CARE_PROVIDER_SITE_OTHER): Payer: BC Managed Care – PPO | Admitting: Family Medicine

## 2013-12-14 ENCOUNTER — Encounter: Payer: Self-pay | Admitting: Family Medicine

## 2013-12-14 VITALS — BP 130/80 | HR 88 | Wt 306.0 lb

## 2013-12-14 DIAGNOSIS — R0781 Pleurodynia: Secondary | ICD-10-CM

## 2013-12-14 DIAGNOSIS — R609 Edema, unspecified: Secondary | ICD-10-CM

## 2013-12-14 DIAGNOSIS — E669 Obesity, unspecified: Secondary | ICD-10-CM | POA: Insufficient documentation

## 2013-12-14 DIAGNOSIS — R0609 Other forms of dyspnea: Secondary | ICD-10-CM

## 2013-12-14 DIAGNOSIS — R0683 Snoring: Secondary | ICD-10-CM

## 2013-12-14 DIAGNOSIS — R0989 Other specified symptoms and signs involving the circulatory and respiratory systems: Secondary | ICD-10-CM

## 2013-12-14 DIAGNOSIS — J309 Allergic rhinitis, unspecified: Secondary | ICD-10-CM | POA: Insufficient documentation

## 2013-12-14 DIAGNOSIS — R079 Chest pain, unspecified: Secondary | ICD-10-CM

## 2013-12-14 MED ORDER — MONTELUKAST SODIUM 10 MG PO TABS: 10.0000 mg | ORAL_TABLET | Freq: Every day | ORAL | Status: DC

## 2013-12-14 NOTE — Progress Notes (Signed)
   Subjective:    Patient ID: Herbert Bryant, male    DOB: [birth year], 33 y.o.   MRN: [id number]  HPI He is here for multiple issues. He has a long history of difficulty with allergies and presently is on Nasacort however he uses Afrin up to 3 times per day to help with breathing. He has not tried an antihistamine or other allergy medications. He also is had difficulty with snoring as well as fatigue and he has been told that there have been some apneic episodes. He complains of bilateral lower extremity swelling that is worse when he gets out of bed. He has tried some support stockings with some relief of symptoms. He also complains of right rib discomfort that is worse with motion. No fever, chills, nausea, vomiting, shortness of breath.   Review of Systems     Objective:   Physical Exam Alert and in no distress. No chest wall tenderness. Skin is normal. Lungs are clear to auscultation.       Assessment & Plan:  Allergic rhinitis - Plan: montelukast (SINGULAIR) 10 MG tablet  Snoring - Plan: Nocturnal polysomnography  Dependent edema  Rib pain on right side  I will have him use Nasacort, Singulair and antihistamine of choice. Encouraged him to stop the Afrin entirely. He will return here in one month for recheck. I will also set him up for outpatient sleep study. Discussed dependent edema as relation to his sitting for long periods of time, weight and lack of physical activity. Recommend elevating his foot is much is possible and using support stockings. Did not recommend using a diuretic. No particular therapy for the rib pain than watchful waiting. Return here in approximately one month to rediscuss the nasal problem as well as the sleep study. I also discussed his weight in regard to his risk for sleep apnea and strongly encouraged him to make dietary changes.

## 2013-12-14 NOTE — Patient Instructions (Addendum)
Use 2 squirts of Nasacort daily. Use Claritin or Allegra as well. Also started on Singulair. Stop the Afrin entirely Exercise your legs. Elevate your feet. Port stockings

## 2014-01-10 ENCOUNTER — Ambulatory Visit (INDEPENDENT_AMBULATORY_CARE_PROVIDER_SITE_OTHER): Payer: BC Managed Care – PPO | Admitting: Family Medicine

## 2014-01-10 ENCOUNTER — Other Ambulatory Visit: Payer: Self-pay | Admitting: Family Medicine

## 2014-01-10 VITALS — BP 124/84 | HR 89 | Wt 310.0 lb

## 2014-01-10 DIAGNOSIS — J301 Allergic rhinitis due to pollen: Secondary | ICD-10-CM

## 2014-01-10 DIAGNOSIS — N6310 Unspecified lump in the right breast, unspecified quadrant: Secondary | ICD-10-CM

## 2014-01-10 DIAGNOSIS — N644 Mastodynia: Secondary | ICD-10-CM

## 2014-01-10 NOTE — Progress Notes (Signed)
   Subjective:    Patient ID: Herbert GuernseyMichael O Bryant, male    DOB: 07-Apr-1981, 33 y.o.   MRN: [id number]  HPI He is here for recheck. His allergies are under better control however he has not started on Singulair. He did note that there could be some psychological issues and he is scared since he is quite stable on his present medications. He states that he is using Afrin only once per day. He has not had the sleep study done yet but is scheduled for this. He also notes right breast pain and a lump in the right breast.   Review of Systems     Objective:   Physical Exam Alert and in no distress. 1 cm slightly tender lesion is noted in the right nipple area at the 9:00 position.      Assessment & Plan:  Breast pain, right  Allergic rhinitis due to pollen  he will be scheduled for a mammogram. Also encouraged him to try the Singulair and if he has any difficulties, let me know. He is also to schedule a followup appointment once the sleep study is done.

## 2014-01-17 ENCOUNTER — Telehealth: Payer: Self-pay

## 2014-01-17 NOTE — Telephone Encounter (Signed)
PT CALLED TODAY TO GET THE NUMBER FOR [facility] HE HAD APPOINTMENT TODAY AND IS GOING TO HAVE TO CANCEL THERE IS NO NOTE IN SYSTEM ABOUT THIS AND I WAS GLAD THAT PT HAD NAME OF COMPANY

## 2014-01-20 ENCOUNTER — Other Ambulatory Visit: Payer: BC Managed Care – PPO

## 2014-01-26 ENCOUNTER — Encounter (INDEPENDENT_AMBULATORY_CARE_PROVIDER_SITE_OTHER): Payer: Self-pay

## 2014-01-26 ENCOUNTER — Ambulatory Visit
Admission: RE | Admit: 2014-01-26 | Discharge: 2014-01-26 | Disposition: A | Discharge status: Home / Self Care | Payer: BC Managed Care – PPO | Source: Ambulatory Visit | Attending: Family Medicine | Admitting: Family Medicine

## 2014-01-26 DIAGNOSIS — N6310 Unspecified lump in the right breast, unspecified quadrant: Secondary | ICD-10-CM

## 2014-02-01 ENCOUNTER — Telehealth: Payer: Self-pay | Admitting: Family Medicine

## 2014-02-01 DIAGNOSIS — R0683 Snoring: Secondary | ICD-10-CM

## 2014-02-01 NOTE — Telephone Encounter (Signed)
Order entered for patient to have sleep study at [facility].

## 2014-02-21 ENCOUNTER — Telehealth: Payer: Self-pay

## 2014-02-21 NOTE — Telephone Encounter (Signed)
PT SAID THAT THE SLEEP STUDY IS NOT CORRECT HE GOT ANOYED WITH IT AND TOOK IT BACK HE IS HAVING A SLEEP STUDY DONE IN LAB AT THE END OF SEPT

## 2014-02-24 ENCOUNTER — Encounter: Payer: Self-pay | Admitting: Family Medicine

## 2014-03-06 ENCOUNTER — Ambulatory Visit (HOSPITAL_BASED_OUTPATIENT_CLINIC_OR_DEPARTMENT_OTHER): Payer: BC Managed Care – PPO

## 2014-03-14 ENCOUNTER — Ambulatory Visit (HOSPITAL_BASED_OUTPATIENT_CLINIC_OR_DEPARTMENT_OTHER): Payer: BC Managed Care – PPO | Attending: Family Medicine | Admitting: Radiology

## 2014-03-14 VITALS — Ht 70.0 in

## 2014-03-14 DIAGNOSIS — G4710 Hypersomnia, unspecified: Secondary | ICD-10-CM

## 2014-03-14 DIAGNOSIS — R5381 Other malaise: Secondary | ICD-10-CM

## 2014-03-14 DIAGNOSIS — G4733 Obstructive sleep apnea (adult) (pediatric): Secondary | ICD-10-CM | POA: Insufficient documentation

## 2014-03-14 DIAGNOSIS — G4730 Sleep apnea, unspecified: Secondary | ICD-10-CM

## 2014-03-14 DIAGNOSIS — R0683 Snoring: Secondary | ICD-10-CM

## 2014-03-14 DIAGNOSIS — R5383 Other fatigue: Secondary | ICD-10-CM

## 2014-03-18 DIAGNOSIS — R5381 Other malaise: Secondary | ICD-10-CM

## 2014-03-18 DIAGNOSIS — G4730 Sleep apnea, unspecified: Secondary | ICD-10-CM

## 2014-03-18 DIAGNOSIS — R0989 Other specified symptoms and signs involving the circulatory and respiratory systems: Secondary | ICD-10-CM

## 2014-03-18 DIAGNOSIS — G4710 Hypersomnia, unspecified: Secondary | ICD-10-CM

## 2014-03-18 DIAGNOSIS — R0609 Other forms of dyspnea: Secondary | ICD-10-CM

## 2014-03-18 DIAGNOSIS — R5383 Other fatigue: Secondary | ICD-10-CM

## 2014-03-18 NOTE — Sleep Study (Addendum)
And a      NAME: Herbert Bryant DATE OF BIRTH:  [birth year] MEDICAL RECORD NUMBER [id number]  LOCATION: [facility]  PHYSICIAN: [name]  DATE OF STUDY: 03/14/2014  SLEEP STUDY TYPE: Nocturnal Polysomnogram               REFERRING PHYSICIAN: [name], MD  INDICATION FOR STUDY: Insomnia with sleep apnea  EPWORTH SLEEPINESS SCORE:   6/24 HEIGHT:  (177.8 cm)  WEIGHT:  (313#)    Body mass index is 0.00 kg/(m^2).  NECK SIZE: 19 in.  MEDICATIONS: Charted for review  SLEEP ARCHITECTURE: Split study protocol. During the diagnostic phase, total sleep time 123.5 minutes with sleep efficiency 81.8%. Stage I was 7.3%, stage II 92.7%, stage III and REM were absent. Sleep latency 0 minutes awake after sleep onset 27.5 minutes arousal index 17.5, bedtime medication: Seroquel  RESPIRATORY DATA: Apnea hypopneas index (AHI) 16.5 per hour. 34 total events scored, all as hypopneas, non-positional. CPAP titrated to 9 CWP, AHI 0 per hour. She wore a small fullface mask.  OXYGEN DATA: Very loud snoring before CPAP with oxygen desaturation to a nadir of 88% on room air. With CPAP control, snoring was prevented and mean oxygen saturation was 94.2%.  CARDIAC DATA: Normal sinus rhythm  MOVEMENT/PARASOMNIA: No significant movement disturbance, bathroom x3  IMPRESSION/ RECOMMENDATION:   1) Moderate obstructive sleep apnea/hypopneas syndrome, AHI of 16.5 per hour. Non-positional events. Very loud snoring with oxygen desaturation to a nadir of 88% on room air.  2) Successful CPAP titration to 9 CWP, AHI 0 per hour. He wore a small [employer] Simplus fullface mask with large headgear, heated humidifier. Snoring was prevented and mean oxygen saturation was 94.2% on room air. [name] Diplomate, American Board of Sleep Medicine  ELECTRONICALLY SIGNED ON:  03/18/2014, 12:58 PM [facility] PH: [phone]   FX: [phone] ACCREDITED BY  THE AMERICAN ACADEMY OF SLEEP MEDICINE

## 2014-03-23 ENCOUNTER — Institutional Professional Consult (permissible substitution): Payer: BC Managed Care – PPO | Admitting: Family Medicine

## 2014-03-24 ENCOUNTER — Ambulatory Visit (INDEPENDENT_AMBULATORY_CARE_PROVIDER_SITE_OTHER): Payer: BC Managed Care – PPO | Admitting: Family Medicine

## 2014-03-24 DIAGNOSIS — G4733 Obstructive sleep apnea (adult) (pediatric): Secondary | ICD-10-CM

## 2014-03-25 NOTE — Progress Notes (Signed)
   Subjective:    Patient ID: Herbert Bryant, male    DOB: Jun 23, 1981, 33 y.o.   MRN: [id number]  HPI He is here to discuss recent sleep study. He did have an initial study done but states that he really did not achieve any good sleep and therefore was sent for more formal testing.   Review of Systems     Objective:   Physical Exam Herbert Bryant and in no distress otherwise not examined. The testing did show moderate sleep apnea and recommended proper settings and mask.       Assessment & Plan:  OSA (obstructive sleep apnea)  Morbid obesity  this information was conveyed to the patient. We will set him up for a CPAP machine and get this read. Also discussed the need for him to make lifestyle changes to help with weight reduction. He recognizes this but also recognizes he is on medication that can interfere with weight loss.

## 2014-03-27 ENCOUNTER — Telehealth: Payer: Self-pay | Admitting: Family Medicine

## 2014-03-27 NOTE — Telephone Encounter (Signed)
CALLED [facility] THEY SAID TO SEND REFERRAL OVER THEY WILL CALL ME AND LET ME KNOW HOW LONG IT WILL TAKE CALL AND INFORMED Khiyan HE SAID OKAY

## 2014-03-27 NOTE — Telephone Encounter (Signed)
PT heard from [place] today and they advised 3 - 9 days for paper work and he is getting ready to leave on a trip and wants to take it with him.  He wants to know if [facility] is any faster.  Pt going to sleep in 45 min and if you don't talk with him before that time he states you guys will have missed another day for the CPAP.  Please call pt [phone]

## 2014-04-05 ENCOUNTER — Encounter (HOSPITAL_BASED_OUTPATIENT_CLINIC_OR_DEPARTMENT_OTHER): Payer: BC Managed Care – PPO

## 2014-04-12 ENCOUNTER — Encounter (HOSPITAL_BASED_OUTPATIENT_CLINIC_OR_DEPARTMENT_OTHER): Payer: BC Managed Care – PPO

## 2014-05-04 ENCOUNTER — Telehealth: Payer: Self-pay

## 2014-05-04 NOTE — Telephone Encounter (Signed)
Left message to please call us and let us know what is going on with his cpap if we can help with anything

## 2014-05-05 ENCOUNTER — Telehealth: Payer: Self-pay | Admitting: Internal Medicine

## 2014-05-05 NOTE — Telephone Encounter (Signed)
Pt called stating that the 1st mask he got was driving him in sane and so he went to get a 2nd mask, did good the first night and then second night, it was leaking all over his face, air was shooting up in his eyes. So pt went back for a 3rd time to try on another mask and patricia @ [facility] was going to try to find him an older mask of a different brand to see if that would help any and he has not heard back yet

## 2014-12-04 ENCOUNTER — Telehealth: Payer: Self-pay | Admitting: Internal Medicine

## 2014-12-04 NOTE — Telephone Encounter (Signed)
Faxed over lab reports to [facility] @ [phone]

## 2015-01-11 ENCOUNTER — Encounter: Payer: Self-pay | Admitting: Family Medicine

## 2015-01-11 ENCOUNTER — Ambulatory Visit (INDEPENDENT_AMBULATORY_CARE_PROVIDER_SITE_OTHER): Payer: BLUE CROSS/BLUE SHIELD | Admitting: Family Medicine

## 2015-01-11 VITALS — BP 120/70 | HR 82 | Wt 308.4 lb

## 2015-01-11 DIAGNOSIS — R0689 Other abnormalities of breathing: Secondary | ICD-10-CM

## 2015-01-11 DIAGNOSIS — L649 Androgenic alopecia, unspecified: Secondary | ICD-10-CM | POA: Diagnosis not present

## 2015-01-11 DIAGNOSIS — R0989 Other specified symptoms and signs involving the circulatory and respiratory systems: Secondary | ICD-10-CM | POA: Diagnosis not present

## 2015-01-11 NOTE — Progress Notes (Signed)
   Subjective:    Patient ID: Herbert GuernseyMichael O Bryant, male    DOB: [birth year], 34 y.o.   MRN: [id number]  HPI He is here for consult concerning 2 episodes of what is described as sighing. He was not truly short of breath and had no chest pain, weakness, paresis. These episodes lasted for several minutes and then went away. No history of PND or DOE. 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 He had questions concerning this medication and dosing regimen.   Review of Systems     Objective:   Physical Exam Alert and in no distress. Tympanic membranes and canals are normal. Pharyngeal area is normal. Neck is supple without adenopathy or thyromegaly. Cardiac exam shows a regular sinus rhythm without murmurs or gallops. Lungs are clear to auscultation. Thinning on the top of the head is noted.       Assessment & Plan:  Sighing respiration  Male pattern baldness I explained that he was not truly having chest or cardiac related symptoms. Is probably more positional and his weight the plays a role in his sighing. Also discussed use of finasteride. Did recommend he take it on a daily basis and do photo's to measure true progress.

## 2015-03-09 ENCOUNTER — Ambulatory Visit (INDEPENDENT_AMBULATORY_CARE_PROVIDER_SITE_OTHER): Payer: BLUE CROSS/BLUE SHIELD | Admitting: Podiatry

## 2015-03-09 ENCOUNTER — Encounter: Payer: Self-pay | Admitting: Podiatry

## 2015-03-09 VITALS — BP 132/81 | HR 92 | Resp 18

## 2015-03-09 DIAGNOSIS — M722 Plantar fascial fibromatosis: Secondary | ICD-10-CM | POA: Diagnosis not present

## 2015-03-09 NOTE — Progress Notes (Signed)
Subjective:     Patient ID: Herbert Bryant, male   DOB: 1981/04/12, 34 y.o.   MRN: [id number]  HPI This patient presents to the office with continued pain in both heels.  He says he has pain in AM and upon rising during the day.  Pain is localized in both heels.  He has been treated with injection therapy previously and desires evaluation and treatment.  Review of Systems     Objective:   Physical Exam GENERAL APPEARANCE: Alert, conversant. Appropriately groomed. No acute distress.  VASCULAR: Pedal pulses palpable at  [facility] and PT bilateral.  Capillary refill time is immediate to all digits,  Normal temperature gradient.  Digital hair growth is present bilateral  NEUROLOGIC: sensation is normal to 5.07 monofilament at 5/5 sites bilateral.  Light touch is intact bilateral, Muscle strength normal.  MUSCULOSKELETAL: acceptable muscle strength, tone and stability bilateral.  Intrinsic muscluature intact bilateral.  Rectus appearance of foot and digits noted bilateral.  Palpable pain at insertion plantar fascia both feet.  DERMATOLOGIC: skin color, texture, and turgor are within normal limits.  No preulcerative lesions or ulcers  are seen, no interdigital maceration noted.  No open lesions present.  Digital nails are asymptomatic. No drainage noted.      Assessment:     Plantar fascitis  B/L     Plan:     ROV  Injection therapy both heels.  Told him to take Mobic as needed.  Continue purestride usage.

## 2015-07-25 ENCOUNTER — Encounter: Payer: Self-pay | Admitting: Podiatry

## 2015-07-25 ENCOUNTER — Ambulatory Visit (INDEPENDENT_AMBULATORY_CARE_PROVIDER_SITE_OTHER): Payer: BLUE CROSS/BLUE SHIELD | Admitting: Podiatry

## 2015-07-25 DIAGNOSIS — M722 Plantar fascial fibromatosis: Secondary | ICD-10-CM

## 2015-07-25 NOTE — Progress Notes (Signed)
Subjective:     Patient ID: Herbert GuernseyMichael O Bryant, male   DOB: January 02, 1981, 35 y.o.   MRN: [id number]  HPI This patient presents to the office with continued pain in both heels.  He says he has pain in AM and upon rising during the day.  Pain is localized in both heels.  He has been treated with injection therapy previously and desires evaluation and treatment.  Review of Systems     Objective:   Physical Exam GENERAL APPEARANCE: Alert, conversant. Appropriately groomed. No acute distress.  VASCULAR: Pedal pulses palpable at  [facility] bilateral.  Capillary refill time is immediate to all digits,  Normal temperature gradient.  Digital hair growth is present bilateral  NEUROLOGIC: sensation is normal to 5.07 monofilament at 5/5 sites bilateral.  Light touch is intact bilateral, Muscle strength normal.  MUSCULOSKELETAL: acceptable muscle strength, tone and stability bilateral.  Intrinsic muscluature intact bilateral.  Rectus appearance of foot and digits noted bilateral.  Palpable pain at insertion plantar fascia both feet. Patient marks heels himself prior to injection.  DERMATOLOGIC: skin color, texture, and turgor are within normal limits.  No preulcerative lesions or ulcers  are seen, no interdigital maceration noted.  No open lesions present.  Digital nails are asymptomatic. No drainage noted.      Assessment:     Plantar fascitis  B/L     Plan:     ROV  Injection therapy both heels.  Told him to take Mobic as needed.  Continue purestride usage.    Helane GuntherGregory Yona Kosek DPM

## 2015-07-25 NOTE — Addendum Note (Signed)
Addended by: Harlon FlorSOUTHERLAND, Gerlad Pelzel L on: 07/25/2015 04:51 PM   Modules accepted: Medications

## 2015-10-22 ENCOUNTER — Telehealth: Payer: Self-pay

## 2015-10-22 NOTE — Telephone Encounter (Signed)
Psychiatrist is recommending having liver function checked because of seroquel medication, is that okay?

## 2015-10-22 NOTE — Telephone Encounter (Signed)
Tried to call pt mailbox is full.

## 2015-10-22 NOTE — Telephone Encounter (Signed)
That's fine but we need a note from his psychiatrist so we know where to send the results

## 2015-10-23 NOTE — Telephone Encounter (Signed)
Someone up front talked with pt informed him and pt agreed

## 2015-12-13 ENCOUNTER — Ambulatory Visit (INDEPENDENT_AMBULATORY_CARE_PROVIDER_SITE_OTHER): Payer: BLUE CROSS/BLUE SHIELD | Admitting: Podiatry

## 2015-12-13 ENCOUNTER — Encounter: Payer: Self-pay | Admitting: Podiatry

## 2015-12-13 VITALS — BP 137/87 | HR 98 | Resp 16

## 2015-12-13 DIAGNOSIS — M722 Plantar fascial fibromatosis: Secondary | ICD-10-CM | POA: Diagnosis not present

## 2015-12-13 NOTE — Progress Notes (Signed)
Subjective:     Patient ID: Herbert Bryant, male   DOB: 26-Apr-1981, 35 y.o.   MRN: [id number]  HPI This patient presents to the office with continued pain in both heels.  He says he has pain in AM and upon rising during the day.  Pain is localized in both heels.  He has been treated with injection therapy previously and desires evaluation and treatment.  Review of Systems     Objective:   Physical Exam GENERAL APPEARANCE: Alert, conversant. Appropriately groomed. No acute distress.  VASCULAR: Pedal pulses palpable at  [facility] bilateral.  Capillary refill time is immediate to all digits,  Normal temperature gradient.  Digital hair growth is present bilateral  NEUROLOGIC: sensation is normal to 5.07 monofilament at 5/5 sites bilateral.  Light touch is intact bilateral, Muscle strength normal.  MUSCULOSKELETAL: acceptable muscle strength, tone and stability bilateral.  Intrinsic muscluature intact bilateral.  Rectus appearance of foot and digits noted bilateral.  Palpable pain at insertion plantar fascia both feet. Patient marks heels himself prior to injection.  DERMATOLOGIC: skin color, texture, and turgor are within normal limits.  No preulcerative lesions or ulcers  are seen, no interdigital maceration noted.  No open lesions present.  Digital nails are asymptomatic. No drainage noted.      Assessment:     Plantar fascitis  B/L     Plan:     ROV  Injection therapy both heels.  Told him to take Mobic as needed.  Continue purestride usage.    Helane GuntherGregory Danaisha Celli DPM

## 2015-12-23 DIAGNOSIS — S0011XA Contusion of right eyelid and periocular area, initial encounter: Secondary | ICD-10-CM | POA: Diagnosis not present

## 2015-12-23 DIAGNOSIS — W5503XA Scratched by cat, initial encounter: Secondary | ICD-10-CM | POA: Diagnosis not present

## 2015-12-26 ENCOUNTER — Encounter: Payer: Self-pay | Admitting: Podiatry

## 2015-12-26 ENCOUNTER — Ambulatory Visit (INDEPENDENT_AMBULATORY_CARE_PROVIDER_SITE_OTHER): Payer: BLUE CROSS/BLUE SHIELD | Admitting: Podiatry

## 2015-12-26 VITALS — BP 138/86 | HR 80 | Resp 16

## 2015-12-26 DIAGNOSIS — M722 Plantar fascial fibromatosis: Secondary | ICD-10-CM | POA: Diagnosis not present

## 2015-12-26 NOTE — Progress Notes (Signed)
Subjective:     Patient ID: Herbert Bryant, male   DOB: 1981/01/18, 35 y.o.   MRN: [id number]  HPI this patient presents to the office saying he still having pain in his left heel. He says that the injection that was provided 2 weeks ago has resolved all the pain in the right foot but the left foot continues to hurt. He says he has pain after a days activity.  He returns to the office for continued evaluation and treatment.   Review of Systems     Objective:   Physical Exam GENERAL APPEARANCE: Alert, conversant. Appropriately groomed. No acute distress.  VASCULAR: Pedal pulses are  [facility]  [facility].  Capillary refill time is immediate to all digits,  Normal temperature gradient.  Digital hair growth is present bilateral  NEUROLOGIC: sensation is normal to 5.07 monofilament at 5/5 sites bilateral.  Light touch is intact bilateral, Muscle strength normal.  MUSCULOSKELETAL: acceptable muscle strength, tone and stability bilateral.  Intrinsic muscluature intact bilateral.  Rectus appearance of foot and digits noted bilateral. Palpable pain medial aspect left heel.  Pain at the insertion of the plantar fascia has resolved.  DERMATOLOGIC: skin color, texture, and turgor are within normal limits.  No preulcerative lesions or ulcers  are seen, no interdigital maceration noted.  No open lesions present.  Digital nails are asymptomatic. No drainage noted.      Assessment:     Plantar fascitis left heel     Plan:     ROV  Injection therapy left heel.  This patient presented to the office and had marked the area of the pain 2 weeks ago. The marked area was more plantar close to the insertion of the plantar fascia left heel. Today he marked the medial aspect of the left heel as the site of most pain. There was palpable pain elicited on the left heel medial aspect. This may be referred pain due to his plantar fasciitis or he may have mild inflammation to the calcaneal nerve left heel . RTC  prn   Herbert GuntherGregory Leonna Bryant DPM

## 2016-01-04 ENCOUNTER — Encounter: Payer: Self-pay | Admitting: Family Medicine

## 2016-01-04 ENCOUNTER — Ambulatory Visit (INDEPENDENT_AMBULATORY_CARE_PROVIDER_SITE_OTHER): Payer: BLUE CROSS/BLUE SHIELD | Admitting: Family Medicine

## 2016-01-04 VITALS — BP 130/80 | HR 77 | Ht 70.0 in | Wt 316.8 lb

## 2016-01-04 DIAGNOSIS — Z79899 Other long term (current) drug therapy: Secondary | ICD-10-CM

## 2016-01-04 DIAGNOSIS — R351 Nocturia: Secondary | ICD-10-CM

## 2016-01-04 DIAGNOSIS — G4733 Obstructive sleep apnea (adult) (pediatric): Secondary | ICD-10-CM | POA: Diagnosis not present

## 2016-01-04 DIAGNOSIS — R609 Edema, unspecified: Secondary | ICD-10-CM

## 2016-01-04 LAB — CBC WITH DIFFERENTIAL/PLATELET
BASOS ABS: 0 {cells}/uL (ref 0–200)
Basophils Relative: 0 %
EOS PCT: 2 %
Eosinophils Absolute: 194 cells/uL (ref 15–500)
HCT: 44.4 % (ref 38.5–50.0)
HEMOGLOBIN: 14.9 g/dL (ref 13.2–17.1)
LYMPHS ABS: 1940 {cells}/uL (ref 850–3900)
Lymphocytes Relative: 20 %
MCH: 28.7 pg (ref 27.0–33.0)
MCHC: 33.6 g/dL (ref 32.0–36.0)
MCV: 85.4 fL (ref 80.0–100.0)
MPV: 10.6 fL (ref 7.5–12.5)
Monocytes Absolute: 582 cells/uL (ref 200–950)
Monocytes Relative: 6 %
NEUTROS ABS: 6984 {cells}/uL (ref 1500–7800)
Neutrophils Relative %: 72 %
PLATELETS: 230 10*3/uL (ref 140–400)
RBC: 5.2 MIL/uL (ref 4.20–5.80)
RDW: 14.2 % (ref 11.0–15.0)
WBC: 9.7 10*3/uL (ref 4.0–10.5)

## 2016-01-04 LAB — COMPREHENSIVE METABOLIC PANEL
ALT: 20 U/L (ref 9–46)
AST: 16 U/L (ref 10–40)
Albumin: 4.2 g/dL (ref 3.6–5.1)
Alkaline Phosphatase: 107 U/L (ref 40–115)
BILIRUBIN TOTAL: 0.5 mg/dL (ref 0.2–1.2)
BUN: 11 mg/dL (ref 7–25)
CHLORIDE: 99 mmol/L (ref 98–110)
CO2: 26 mmol/L (ref 20–31)
Calcium: 8.6 mg/dL (ref 8.6–10.3)
Creat: 0.97 mg/dL (ref 0.60–1.35)
Glucose, Bld: 81 mg/dL (ref 65–99)
Potassium: 4 mmol/L (ref 3.5–5.3)
Sodium: 139 mmol/L (ref 135–146)
TOTAL PROTEIN: 7.3 g/dL (ref 6.1–8.1)

## 2016-01-04 NOTE — Progress Notes (Signed)
   Subjective:    Patient ID: Herbert Bryant, male    DOB: 14-Jan-1981, 35 y.o.   MRN: [id number]  HPI He is here for evaluation of multiple issues. He is being followed by Dr. Evelene CroonKaur or his psychiatric care. He is on Seroquel and apparently she wants blood studies done. He also has had difficulty with binge eating during this timeframe. He did complain of having hangover symptoms from drinking only 2 beers and has questions about this. He does have OSA and states that the CPAP mask causes him some difficulty. He would like to have an oral appliance. He has questions concerning the use of temazepam and Xanax. His psychiatrist is giving him his medication. He does complain of nocturia as well as daytime peripheral edema especially with sitting for long periods at times. No chest pain, shortness breath, PND. He is not working and apparently has means of surviving without working.   Review of Systems     Objective:   Physical Exam Alert and in no distress. Cardiac and lung exam normal. Lower extremities does show 1+ pitting edema with some hemosiderin tattooing noted       Assessment & Plan:  Dependent edema - Plan: CBC with Differential/Platelet, Comprehensive metabolic panel  OSA (obstructive sleep apnea)  Morbid obesity due to excess calories (HCC) - Plan: CBC with Differential/Platelet, Comprehensive metabolic panel  Nocturia - Plan: CBC with Differential/Platelet, Comprehensive metabolic panel  Encounter for long-term (current) use of medications Routine blood screening will be done. Discussed the dependent edema with him in regards to keeping his feet elevated as much as possible and also exercising regularly. Also stated that this would help with his underlying psychiatric problems as well. Discussed the use of his benzodiazepines and told him to be very careful with him. Also instructed him to cut back on his fluids especially at night to help with the nocturia. Encouraged him to avoid  all alcohol. Over 25 minutes, greater than 80% spent in counseling and coordination of care.

## 2016-02-15 DIAGNOSIS — F340 Cyclothymic disorder: Secondary | ICD-10-CM | POA: Diagnosis not present

## 2016-02-15 DIAGNOSIS — F900 Attention-deficit hyperactivity disorder, predominantly inattentive type: Secondary | ICD-10-CM | POA: Diagnosis not present

## 2016-04-02 DIAGNOSIS — F310 Bipolar disorder, current episode hypomanic: Secondary | ICD-10-CM | POA: Diagnosis not present

## 2016-04-19 DIAGNOSIS — R05 Cough: Secondary | ICD-10-CM | POA: Diagnosis not present

## 2016-04-19 DIAGNOSIS — J019 Acute sinusitis, unspecified: Secondary | ICD-10-CM | POA: Diagnosis not present

## 2016-05-24 DIAGNOSIS — Z23 Encounter for immunization: Secondary | ICD-10-CM | POA: Diagnosis not present

## 2016-11-18 DIAGNOSIS — F900 Attention-deficit hyperactivity disorder, predominantly inattentive type: Secondary | ICD-10-CM | POA: Diagnosis not present

## 2016-11-18 DIAGNOSIS — F341 Dysthymic disorder: Secondary | ICD-10-CM | POA: Diagnosis not present

## 2016-12-08 DIAGNOSIS — R197 Diarrhea, unspecified: Secondary | ICD-10-CM | POA: Diagnosis not present

## 2016-12-08 DIAGNOSIS — R030 Elevated blood-pressure reading, without diagnosis of hypertension: Secondary | ICD-10-CM | POA: Diagnosis not present

## 2016-12-08 DIAGNOSIS — J069 Acute upper respiratory infection, unspecified: Secondary | ICD-10-CM | POA: Diagnosis not present

## 2016-12-09 DIAGNOSIS — J9801 Acute bronchospasm: Secondary | ICD-10-CM | POA: Diagnosis not present

## 2016-12-09 DIAGNOSIS — R197 Diarrhea, unspecified: Secondary | ICD-10-CM | POA: Diagnosis not present

## 2016-12-10 ENCOUNTER — Telehealth: Payer: Self-pay | Admitting: Family Medicine

## 2016-12-10 ENCOUNTER — Ambulatory Visit (INDEPENDENT_AMBULATORY_CARE_PROVIDER_SITE_OTHER): Payer: BLUE CROSS/BLUE SHIELD | Admitting: Family Medicine

## 2016-12-10 ENCOUNTER — Encounter: Payer: Self-pay | Admitting: Family Medicine

## 2016-12-10 VITALS — BP 110/80 | HR 86 | Temp 98.3°F | Wt 325.0 lb

## 2016-12-10 DIAGNOSIS — J209 Acute bronchitis, unspecified: Secondary | ICD-10-CM | POA: Diagnosis not present

## 2016-12-10 MED ORDER — AMOXICILLIN 875 MG PO TABS: 875.0000 mg | ORAL_TABLET | Freq: Two times a day (BID) | ORAL | 0 refills | Status: DC

## 2016-12-10 NOTE — Telephone Encounter (Signed)
I would not take it at the present time

## 2016-12-10 NOTE — Progress Notes (Signed)
   Subjective:    Patient ID: Herbert GuernseyMichael O Bryant, male    DOB: January 12, 1981, 36 y.o.   MRN: [id number]  HPI He notes that last Friday he developed a sore throat, some nausea, congestion and diarrhea. The diarrhea did clear up. He was seen last Sunday in an urgent care and given medications that he did not fill. He states that his symptoms have gotten worse especially a cough that is now become productive, slight sore throat, sinus pressure, PND with malaise and myalgias but no fever, chills, earache. He continues on multiple medications. He is seeing his psychiatrist on a regular basis and this seems to be going fairly well. He does not smoke.   Review of Systems     Objective:   Physical Exam Alert and in no distress. Tympanic membranes and canals are normal. Pharyngeal area is normal. Neck is supple without adenopathy or thyromegaly. Cardiac exam shows a regular sinus rhythm without murmurs or gallops. Lungs are clear to auscultation.        Assessment & Plan:  Acute bronchitis, unspecified organism - Plan: amoxicillin (AMOXIL) 875 MG tablet I explained that I was not sure that this was truly a bacterial infection but would give him an antibiotic to see if this would help. He is comfortable with that.

## 2016-12-10 NOTE — Telephone Encounter (Signed)
Pr forgot to ask you, said Urgent Care on Monday gave him Rx for prednisone & wanted to know if you are ok with him taking it or if you'd rather him not

## 2016-12-10 NOTE — Telephone Encounter (Signed)
Pt informed and verbalized understanding

## 2017-01-02 ENCOUNTER — Telehealth: Payer: Self-pay | Admitting: Family Medicine

## 2017-01-02 ENCOUNTER — Ambulatory Visit (INDEPENDENT_AMBULATORY_CARE_PROVIDER_SITE_OTHER): Payer: BLUE CROSS/BLUE SHIELD | Admitting: Family Medicine

## 2017-01-02 ENCOUNTER — Encounter: Payer: Self-pay | Admitting: Family Medicine

## 2017-01-02 VITALS — BP 128/84 | HR 85 | Temp 98.4°F | Wt 325.2 lb

## 2017-01-02 DIAGNOSIS — F341 Dysthymic disorder: Secondary | ICD-10-CM | POA: Diagnosis not present

## 2017-01-02 DIAGNOSIS — R300 Dysuria: Secondary | ICD-10-CM

## 2017-01-02 DIAGNOSIS — N3001 Acute cystitis with hematuria: Secondary | ICD-10-CM | POA: Diagnosis not present

## 2017-01-02 DIAGNOSIS — R350 Frequency of micturition: Secondary | ICD-10-CM

## 2017-01-02 LAB — CBC WITH DIFFERENTIAL/PLATELET
BASOS PCT: 0 %
Basophils Absolute: 0 cells/uL (ref 0–200)
EOS PCT: 4 %
Eosinophils Absolute: 376 cells/uL (ref 15–500)
HEMATOCRIT: 41.7 % (ref 38.5–50.0)
Hemoglobin: 14 g/dL (ref 13.2–17.1)
Lymphocytes Relative: 27 %
Lymphs Abs: 2538 cells/uL (ref 850–3900)
MCH: 28.6 pg (ref 27.0–33.0)
MCHC: 33.6 g/dL (ref 32.0–36.0)
MCV: 85.1 fL (ref 80.0–100.0)
MPV: 10.1 fL (ref 7.5–12.5)
Monocytes Absolute: 564 cells/uL (ref 200–950)
Monocytes Relative: 6 %
NEUTROS ABS: 5922 {cells}/uL (ref 1500–7800)
Neutrophils Relative %: 63 %
Platelets: 246 10*3/uL (ref 140–400)
RBC: 4.9 MIL/uL (ref 4.20–5.80)
RDW: 14.2 % (ref 11.0–15.0)
WBC: 9.4 10*3/uL (ref 4.0–10.5)

## 2017-01-02 LAB — COMPREHENSIVE METABOLIC PANEL
ALBUMIN: 4 g/dL (ref 3.6–5.1)
ALT: 23 U/L (ref 9–46)
AST: 15 U/L (ref 10–40)
Alkaline Phosphatase: 103 U/L (ref 40–115)
BILIRUBIN TOTAL: 0.2 mg/dL (ref 0.2–1.2)
BUN: 14 mg/dL (ref 7–25)
CHLORIDE: 102 mmol/L (ref 98–110)
CO2: 23 mmol/L (ref 20–31)
Calcium: 8.9 mg/dL (ref 8.6–10.3)
Creat: 1.04 mg/dL (ref 0.60–1.35)
Glucose, Bld: 113 mg/dL — ABNORMAL HIGH (ref 65–99)
Potassium: 3.8 mmol/L (ref 3.5–5.3)
Sodium: 139 mmol/L (ref 135–146)
Total Protein: 7 g/dL (ref 6.1–8.1)

## 2017-01-02 LAB — HEMOCCULT GUIAC POC 1CARD (OFFICE)
FECAL OCCULT BLD: NEGATIVE
OCCULT BLOOD DATE: 62218

## 2017-01-02 LAB — POCT URINALYSIS DIP (PROADVANTAGE DEVICE)
Bilirubin, UA: NEGATIVE
Glucose, UA: NEGATIVE mg/dL
Ketones, POC UA: NEGATIVE mg/dL
Nitrite, UA: NEGATIVE
Protein Ur, POC: NEGATIVE mg/dL
Specific Gravity, Urine: 1.03
Urobilinogen, Ur: NEGATIVE
pH, UA: 6 (ref 5.0–8.0)

## 2017-01-02 LAB — LIPID PANEL
Cholesterol: 238 mg/dL — ABNORMAL HIGH (ref <200)
HDL: 29 mg/dL — ABNORMAL LOW (ref >40)
LDL Cholesterol: 162 mg/dL — ABNORMAL HIGH (ref <100)
TRIGLYCERIDES: 236 mg/dL — AB (ref <150)
Total CHOL/HDL Ratio: 8.2 Ratio — ABNORMAL HIGH (ref <5.0)
VLDL: 47 mg/dL — ABNORMAL HIGH (ref <30)

## 2017-01-02 MED ORDER — CIPROFLOXACIN HCL 500 MG PO TABS: 500.0000 mg | ORAL_TABLET | Freq: Two times a day (BID) | ORAL | 0 refills | Status: DC

## 2017-01-02 NOTE — Progress Notes (Signed)
Subjective: Chief Complaint  Patient presents with  . UTI    UTI- blood work needed from pysch approved by Dr. Susann GivensLalonde. on top of bloodwork, wants A!c and magenisum checked     Herbert GuernseyMichael O Bryant is a 36 y.o. male who complains of possible urinary tract infection.  He has had symptoms for 4 days.  Symptoms include urinary frequency, constant need to urinate, itching and burning with urination, polyuria . Patient denies fever, chills, abdominal pain, back pain, nausea, vomiting, diarrhea .  Last UTI was unknown.   Using nothing for current symptoms.    No sexual intercourse in several years per patient.   Patient does not have a history of recurrent UTI. Patient does not have a history of pyelonephritis.  No other aggravating or relieving factors.  No other c/o.  He has orders for labs from his psychiatrist. Dr. Susann GivensLalonde, PCP, approved this. He would like to add on a hemoglobin A1c. No history of diabetes per patient. Last ate at 9 am- a snack.   States he cannot drink even 1-2 glasses of alcohol without feeling "extremely hungover". Would like to have his magnesium level checked.    Past Medical History:  Diagnosis Date  . Acne   . ADD (attention deficit disorder)   . ADHD (attention deficit hyperactivity disorder)   . Allergy   . Bipolar 1 disorder (HCC)   . IBS (irritable bowel syndrome)     ROS as in subjective  Reviewed allergies, medications, past medical, surgical, and social history.    Objective: Vitals:   01/02/17 1031  BP: 128/84  Pulse: 85  Temp: 98.4 F (36.9 C)    General appearance: alert, no distress, WD/WN, male Abdomen: +bs, soft, non tender, non distended, no masses, no hepatomegaly, no splenomegaly, no bruits Back: no CVA tenderness GU: prostate exam performed, non tender, no enlargement or nodules palpated. Hemoccult negative.      Laboratory:  Urine dipstick: spec grav 1.030, Leu 1+, trace blood .       Assessment: Acute cystitis with  hematuria  Dysthymic disorder - Plan: CBC with Differential, Comprehensive metabolic panel, Lipid Panel, TSH, B12 and Folate Panel, Vitamin D 1,25 dihydroxy  Urinary frequency - Plan: POCT Urinalysis DIP (Proadvantage Device), RPR, Urine Culture, GC/Chlamydia Probe Amp, HIV antibody, POCT occult blood stool  Dysuria - Plan: POCT Urinalysis DIP (Proadvantage Device), RPR, Urine Culture, GC/Chlamydia Probe Amp, HIV antibody    Plan: Will start treatment based on symptoms. Urine culture sent along with GC/CT, HIV, RPR  Cipro prescription to pharmacy. Prostate exam unremarkable. .  Advised increased water intake, can use OTC Tylenol for pain.    Advised that if his urine culture is positive then a referral to urology would be needed.   Call or return if worse or not improving.   Follow up pending labs. He was not fasting today, last ate 2 1/2 hours prior to lab draw.

## 2017-01-02 NOTE — Telephone Encounter (Signed)
ok 

## 2017-01-02 NOTE — Telephone Encounter (Signed)
Will enter orders when pt arrives for appt.

## 2017-01-02 NOTE — Telephone Encounter (Signed)
Pt seen vickie orders placed then

## 2017-01-02 NOTE — Telephone Encounter (Signed)
Patient coming in at 10:15 today to see Vickie for UTI, he has orders from psych for blood work and he would like to have it done here if that is ok?

## 2017-01-03 LAB — B12 AND FOLATE PANEL
Folate: 17.6 ng/mL (ref >5.4)
Vitamin B-12: 365 pg/mL (ref 200–1100)

## 2017-01-03 LAB — MAGNESIUM: Magnesium: 1.9 mg/dL (ref 1.5–2.5)

## 2017-01-03 LAB — HIV ANTIBODY (ROUTINE TESTING W REFLEX): HIV 1&2 Ab, 4th Generation: NONREACTIVE

## 2017-01-03 LAB — RPR

## 2017-01-03 LAB — TSH: TSH: 2.24 mIU/L (ref 0.40–4.50)

## 2017-01-05 ENCOUNTER — Other Ambulatory Visit: Payer: Self-pay | Admitting: Family Medicine

## 2017-01-05 ENCOUNTER — Encounter: Payer: Self-pay | Admitting: Family Medicine

## 2017-01-05 DIAGNOSIS — N3000 Acute cystitis without hematuria: Secondary | ICD-10-CM

## 2017-01-05 LAB — URINE CULTURE

## 2017-01-05 LAB — GC/CHLAMYDIA PROBE AMP
CT Probe RNA: NOT DETECTED
GC PROBE AMP APTIMA: NOT DETECTED

## 2017-01-05 LAB — VITAMIN D 1,25 DIHYDROXY
VITAMIN D 1, 25 (OH) TOTAL: 39 pg/mL (ref 18–72)
Vitamin D3 1, 25 (OH)2: 39 pg/mL

## 2017-01-06 DIAGNOSIS — F341 Dysthymic disorder: Secondary | ICD-10-CM | POA: Diagnosis not present

## 2017-01-15 ENCOUNTER — Ambulatory Visit: Payer: BLUE CROSS/BLUE SHIELD | Admitting: Family Medicine

## 2017-01-15 DIAGNOSIS — F410 Panic disorder [episodic paroxysmal anxiety] without agoraphobia: Secondary | ICD-10-CM | POA: Diagnosis not present

## 2017-01-15 DIAGNOSIS — F332 Major depressive disorder, recurrent severe without psychotic features: Secondary | ICD-10-CM | POA: Diagnosis not present

## 2017-01-15 DIAGNOSIS — G4729 Other circadian rhythm sleep disorder: Secondary | ICD-10-CM | POA: Diagnosis not present

## 2017-01-15 DIAGNOSIS — F900 Attention-deficit hyperactivity disorder, predominantly inattentive type: Secondary | ICD-10-CM | POA: Diagnosis not present

## 2017-01-16 ENCOUNTER — Ambulatory Visit (INDEPENDENT_AMBULATORY_CARE_PROVIDER_SITE_OTHER): Payer: BLUE CROSS/BLUE SHIELD | Admitting: Family Medicine

## 2017-01-16 ENCOUNTER — Ambulatory Visit: Payer: BLUE CROSS/BLUE SHIELD | Admitting: Family Medicine

## 2017-01-16 VITALS — BP 122/80 | Wt 323.0 lb

## 2017-01-16 DIAGNOSIS — L649 Androgenic alopecia, unspecified: Secondary | ICD-10-CM

## 2017-01-16 DIAGNOSIS — E785 Hyperlipidemia, unspecified: Secondary | ICD-10-CM | POA: Diagnosis not present

## 2017-01-16 MED ORDER — FINASTERIDE 1 MG PO TABS: ORAL_TABLET | ORAL | 5 refills | Status: DC

## 2017-01-16 NOTE — Progress Notes (Signed)
   Subjective:    Patient ID: Melany GuernseyMichael O Roca, male    DOB: 1980-11-10, 36 y.o.   MRN: [id number]  HPI He is here for consultation concerning recent blood work which did show abnormal cholesterol pattern. He would like to discuss this further. He also wants refill on his Propecia for male pattern baldness.   Review of Systems     Objective:   Physical Exam Alert and in no distress otherwise not examined       Assessment & Plan:  Male pattern baldness - Plan: finasteride (PROPECIA) 1 MG tablet  Morbid obesity (HCC)  Hyperlipidemia, unspecified hyperlipidemia type I discussed his cholesterol numbers in regard to diet, exercise, risk for heart disease, diabetes. Recommend 20 minutes of exercise on a daily basis. Also discussed cutting back on carbohydrates. Mentioned the [employer] diet or Mediterranean diet.

## 2017-01-16 NOTE — Patient Instructions (Signed)
20 minutes of something physical every day. Move your body  consider either the Mediterranean or the [employer] diet. Both of these are low-carb easiest way to remember that stay with white food Your waist size down to 36 As you lose weight get rid of the big boy close

## 2017-01-23 DIAGNOSIS — F410 Panic disorder [episodic paroxysmal anxiety] without agoraphobia: Secondary | ICD-10-CM | POA: Diagnosis not present

## 2017-01-23 DIAGNOSIS — F332 Major depressive disorder, recurrent severe without psychotic features: Secondary | ICD-10-CM | POA: Diagnosis not present

## 2017-01-23 DIAGNOSIS — F900 Attention-deficit hyperactivity disorder, predominantly inattentive type: Secondary | ICD-10-CM | POA: Diagnosis not present

## 2017-01-23 DIAGNOSIS — G4729 Other circadian rhythm sleep disorder: Secondary | ICD-10-CM | POA: Diagnosis not present

## 2017-01-26 DIAGNOSIS — F900 Attention-deficit hyperactivity disorder, predominantly inattentive type: Secondary | ICD-10-CM | POA: Diagnosis not present

## 2017-01-26 DIAGNOSIS — G4729 Other circadian rhythm sleep disorder: Secondary | ICD-10-CM | POA: Diagnosis not present

## 2017-01-26 DIAGNOSIS — F410 Panic disorder [episodic paroxysmal anxiety] without agoraphobia: Secondary | ICD-10-CM | POA: Diagnosis not present

## 2017-01-26 DIAGNOSIS — F332 Major depressive disorder, recurrent severe without psychotic features: Secondary | ICD-10-CM | POA: Diagnosis not present

## 2017-01-27 DIAGNOSIS — G4729 Other circadian rhythm sleep disorder: Secondary | ICD-10-CM | POA: Diagnosis not present

## 2017-01-27 DIAGNOSIS — F410 Panic disorder [episodic paroxysmal anxiety] without agoraphobia: Secondary | ICD-10-CM | POA: Diagnosis not present

## 2017-01-27 DIAGNOSIS — F332 Major depressive disorder, recurrent severe without psychotic features: Secondary | ICD-10-CM | POA: Diagnosis not present

## 2017-01-27 DIAGNOSIS — F900 Attention-deficit hyperactivity disorder, predominantly inattentive type: Secondary | ICD-10-CM | POA: Diagnosis not present

## 2017-01-28 DIAGNOSIS — F332 Major depressive disorder, recurrent severe without psychotic features: Secondary | ICD-10-CM | POA: Diagnosis not present

## 2017-01-28 DIAGNOSIS — G4729 Other circadian rhythm sleep disorder: Secondary | ICD-10-CM | POA: Diagnosis not present

## 2017-01-28 DIAGNOSIS — F410 Panic disorder [episodic paroxysmal anxiety] without agoraphobia: Secondary | ICD-10-CM | POA: Diagnosis not present

## 2017-01-28 DIAGNOSIS — F900 Attention-deficit hyperactivity disorder, predominantly inattentive type: Secondary | ICD-10-CM | POA: Diagnosis not present

## 2017-01-29 DIAGNOSIS — F900 Attention-deficit hyperactivity disorder, predominantly inattentive type: Secondary | ICD-10-CM | POA: Diagnosis not present

## 2017-01-29 DIAGNOSIS — F410 Panic disorder [episodic paroxysmal anxiety] without agoraphobia: Secondary | ICD-10-CM | POA: Diagnosis not present

## 2017-01-29 DIAGNOSIS — G4729 Other circadian rhythm sleep disorder: Secondary | ICD-10-CM | POA: Diagnosis not present

## 2017-01-29 DIAGNOSIS — F332 Major depressive disorder, recurrent severe without psychotic features: Secondary | ICD-10-CM | POA: Diagnosis not present

## 2017-01-30 DIAGNOSIS — F900 Attention-deficit hyperactivity disorder, predominantly inattentive type: Secondary | ICD-10-CM | POA: Diagnosis not present

## 2017-01-30 DIAGNOSIS — F410 Panic disorder [episodic paroxysmal anxiety] without agoraphobia: Secondary | ICD-10-CM | POA: Diagnosis not present

## 2017-01-30 DIAGNOSIS — F332 Major depressive disorder, recurrent severe without psychotic features: Secondary | ICD-10-CM | POA: Diagnosis not present

## 2017-01-30 DIAGNOSIS — G4729 Other circadian rhythm sleep disorder: Secondary | ICD-10-CM | POA: Diagnosis not present

## 2017-02-02 DIAGNOSIS — F332 Major depressive disorder, recurrent severe without psychotic features: Secondary | ICD-10-CM | POA: Diagnosis not present

## 2017-02-02 DIAGNOSIS — G4729 Other circadian rhythm sleep disorder: Secondary | ICD-10-CM | POA: Diagnosis not present

## 2017-02-02 DIAGNOSIS — F900 Attention-deficit hyperactivity disorder, predominantly inattentive type: Secondary | ICD-10-CM | POA: Diagnosis not present

## 2017-02-02 DIAGNOSIS — F410 Panic disorder [episodic paroxysmal anxiety] without agoraphobia: Secondary | ICD-10-CM | POA: Diagnosis not present

## 2017-02-03 DIAGNOSIS — F410 Panic disorder [episodic paroxysmal anxiety] without agoraphobia: Secondary | ICD-10-CM | POA: Diagnosis not present

## 2017-02-03 DIAGNOSIS — G4729 Other circadian rhythm sleep disorder: Secondary | ICD-10-CM | POA: Diagnosis not present

## 2017-02-03 DIAGNOSIS — F332 Major depressive disorder, recurrent severe without psychotic features: Secondary | ICD-10-CM | POA: Diagnosis not present

## 2017-02-03 DIAGNOSIS — F900 Attention-deficit hyperactivity disorder, predominantly inattentive type: Secondary | ICD-10-CM | POA: Diagnosis not present

## 2017-02-04 DIAGNOSIS — F332 Major depressive disorder, recurrent severe without psychotic features: Secondary | ICD-10-CM | POA: Diagnosis not present

## 2017-02-04 DIAGNOSIS — G4729 Other circadian rhythm sleep disorder: Secondary | ICD-10-CM | POA: Diagnosis not present

## 2017-02-04 DIAGNOSIS — F410 Panic disorder [episodic paroxysmal anxiety] without agoraphobia: Secondary | ICD-10-CM | POA: Diagnosis not present

## 2017-02-04 DIAGNOSIS — F900 Attention-deficit hyperactivity disorder, predominantly inattentive type: Secondary | ICD-10-CM | POA: Diagnosis not present

## 2017-02-05 DIAGNOSIS — F332 Major depressive disorder, recurrent severe without psychotic features: Secondary | ICD-10-CM | POA: Diagnosis not present

## 2017-02-05 DIAGNOSIS — F900 Attention-deficit hyperactivity disorder, predominantly inattentive type: Secondary | ICD-10-CM | POA: Diagnosis not present

## 2017-02-05 DIAGNOSIS — F410 Panic disorder [episodic paroxysmal anxiety] without agoraphobia: Secondary | ICD-10-CM | POA: Diagnosis not present

## 2017-02-05 DIAGNOSIS — G4729 Other circadian rhythm sleep disorder: Secondary | ICD-10-CM | POA: Diagnosis not present

## 2017-02-06 DIAGNOSIS — F410 Panic disorder [episodic paroxysmal anxiety] without agoraphobia: Secondary | ICD-10-CM | POA: Diagnosis not present

## 2017-02-06 DIAGNOSIS — F900 Attention-deficit hyperactivity disorder, predominantly inattentive type: Secondary | ICD-10-CM | POA: Diagnosis not present

## 2017-02-06 DIAGNOSIS — G4729 Other circadian rhythm sleep disorder: Secondary | ICD-10-CM | POA: Diagnosis not present

## 2017-02-06 DIAGNOSIS — F332 Major depressive disorder, recurrent severe without psychotic features: Secondary | ICD-10-CM | POA: Diagnosis not present

## 2017-02-09 DIAGNOSIS — F410 Panic disorder [episodic paroxysmal anxiety] without agoraphobia: Secondary | ICD-10-CM | POA: Diagnosis not present

## 2017-02-09 DIAGNOSIS — G4729 Other circadian rhythm sleep disorder: Secondary | ICD-10-CM | POA: Diagnosis not present

## 2017-02-09 DIAGNOSIS — F900 Attention-deficit hyperactivity disorder, predominantly inattentive type: Secondary | ICD-10-CM | POA: Diagnosis not present

## 2017-02-09 DIAGNOSIS — F332 Major depressive disorder, recurrent severe without psychotic features: Secondary | ICD-10-CM | POA: Diagnosis not present

## 2017-02-10 DIAGNOSIS — F410 Panic disorder [episodic paroxysmal anxiety] without agoraphobia: Secondary | ICD-10-CM | POA: Diagnosis not present

## 2017-02-10 DIAGNOSIS — F332 Major depressive disorder, recurrent severe without psychotic features: Secondary | ICD-10-CM | POA: Diagnosis not present

## 2017-02-10 DIAGNOSIS — F900 Attention-deficit hyperactivity disorder, predominantly inattentive type: Secondary | ICD-10-CM | POA: Diagnosis not present

## 2017-02-10 DIAGNOSIS — G4729 Other circadian rhythm sleep disorder: Secondary | ICD-10-CM | POA: Diagnosis not present

## 2017-02-11 DIAGNOSIS — G4729 Other circadian rhythm sleep disorder: Secondary | ICD-10-CM | POA: Diagnosis not present

## 2017-02-11 DIAGNOSIS — F410 Panic disorder [episodic paroxysmal anxiety] without agoraphobia: Secondary | ICD-10-CM | POA: Diagnosis not present

## 2017-02-11 DIAGNOSIS — F900 Attention-deficit hyperactivity disorder, predominantly inattentive type: Secondary | ICD-10-CM | POA: Diagnosis not present

## 2017-02-11 DIAGNOSIS — F332 Major depressive disorder, recurrent severe without psychotic features: Secondary | ICD-10-CM | POA: Diagnosis not present

## 2017-02-12 DIAGNOSIS — F410 Panic disorder [episodic paroxysmal anxiety] without agoraphobia: Secondary | ICD-10-CM | POA: Diagnosis not present

## 2017-02-12 DIAGNOSIS — F900 Attention-deficit hyperactivity disorder, predominantly inattentive type: Secondary | ICD-10-CM | POA: Diagnosis not present

## 2017-02-12 DIAGNOSIS — F332 Major depressive disorder, recurrent severe without psychotic features: Secondary | ICD-10-CM | POA: Diagnosis not present

## 2017-02-12 DIAGNOSIS — G4729 Other circadian rhythm sleep disorder: Secondary | ICD-10-CM | POA: Diagnosis not present

## 2017-02-13 DIAGNOSIS — F900 Attention-deficit hyperactivity disorder, predominantly inattentive type: Secondary | ICD-10-CM | POA: Diagnosis not present

## 2017-02-13 DIAGNOSIS — F410 Panic disorder [episodic paroxysmal anxiety] without agoraphobia: Secondary | ICD-10-CM | POA: Diagnosis not present

## 2017-02-13 DIAGNOSIS — G4729 Other circadian rhythm sleep disorder: Secondary | ICD-10-CM | POA: Diagnosis not present

## 2017-02-13 DIAGNOSIS — F332 Major depressive disorder, recurrent severe without psychotic features: Secondary | ICD-10-CM | POA: Diagnosis not present

## 2017-02-16 DIAGNOSIS — F900 Attention-deficit hyperactivity disorder, predominantly inattentive type: Secondary | ICD-10-CM | POA: Diagnosis not present

## 2017-02-16 DIAGNOSIS — G4729 Other circadian rhythm sleep disorder: Secondary | ICD-10-CM | POA: Diagnosis not present

## 2017-02-16 DIAGNOSIS — F332 Major depressive disorder, recurrent severe without psychotic features: Secondary | ICD-10-CM | POA: Diagnosis not present

## 2017-02-16 DIAGNOSIS — F410 Panic disorder [episodic paroxysmal anxiety] without agoraphobia: Secondary | ICD-10-CM | POA: Diagnosis not present

## 2017-02-17 DIAGNOSIS — F332 Major depressive disorder, recurrent severe without psychotic features: Secondary | ICD-10-CM | POA: Diagnosis not present

## 2017-02-17 DIAGNOSIS — F900 Attention-deficit hyperactivity disorder, predominantly inattentive type: Secondary | ICD-10-CM | POA: Diagnosis not present

## 2017-02-17 DIAGNOSIS — F410 Panic disorder [episodic paroxysmal anxiety] without agoraphobia: Secondary | ICD-10-CM | POA: Diagnosis not present

## 2017-02-17 DIAGNOSIS — G4729 Other circadian rhythm sleep disorder: Secondary | ICD-10-CM | POA: Diagnosis not present

## 2017-02-18 DIAGNOSIS — F332 Major depressive disorder, recurrent severe without psychotic features: Secondary | ICD-10-CM | POA: Diagnosis not present

## 2017-02-18 DIAGNOSIS — F410 Panic disorder [episodic paroxysmal anxiety] without agoraphobia: Secondary | ICD-10-CM | POA: Diagnosis not present

## 2017-02-18 DIAGNOSIS — F900 Attention-deficit hyperactivity disorder, predominantly inattentive type: Secondary | ICD-10-CM | POA: Diagnosis not present

## 2017-02-18 DIAGNOSIS — G4729 Other circadian rhythm sleep disorder: Secondary | ICD-10-CM | POA: Diagnosis not present

## 2017-02-19 DIAGNOSIS — F410 Panic disorder [episodic paroxysmal anxiety] without agoraphobia: Secondary | ICD-10-CM | POA: Diagnosis not present

## 2017-02-19 DIAGNOSIS — G4729 Other circadian rhythm sleep disorder: Secondary | ICD-10-CM | POA: Diagnosis not present

## 2017-02-19 DIAGNOSIS — F900 Attention-deficit hyperactivity disorder, predominantly inattentive type: Secondary | ICD-10-CM | POA: Diagnosis not present

## 2017-02-19 DIAGNOSIS — F332 Major depressive disorder, recurrent severe without psychotic features: Secondary | ICD-10-CM | POA: Diagnosis not present

## 2017-02-20 DIAGNOSIS — F410 Panic disorder [episodic paroxysmal anxiety] without agoraphobia: Secondary | ICD-10-CM | POA: Diagnosis not present

## 2017-02-20 DIAGNOSIS — F900 Attention-deficit hyperactivity disorder, predominantly inattentive type: Secondary | ICD-10-CM | POA: Diagnosis not present

## 2017-02-20 DIAGNOSIS — F332 Major depressive disorder, recurrent severe without psychotic features: Secondary | ICD-10-CM | POA: Diagnosis not present

## 2017-02-20 DIAGNOSIS — G4729 Other circadian rhythm sleep disorder: Secondary | ICD-10-CM | POA: Diagnosis not present

## 2017-02-23 DIAGNOSIS — F332 Major depressive disorder, recurrent severe without psychotic features: Secondary | ICD-10-CM | POA: Diagnosis not present

## 2017-02-23 DIAGNOSIS — G4729 Other circadian rhythm sleep disorder: Secondary | ICD-10-CM | POA: Diagnosis not present

## 2017-02-23 DIAGNOSIS — F410 Panic disorder [episodic paroxysmal anxiety] without agoraphobia: Secondary | ICD-10-CM | POA: Diagnosis not present

## 2017-02-23 DIAGNOSIS — F900 Attention-deficit hyperactivity disorder, predominantly inattentive type: Secondary | ICD-10-CM | POA: Diagnosis not present

## 2017-02-24 DIAGNOSIS — F410 Panic disorder [episodic paroxysmal anxiety] without agoraphobia: Secondary | ICD-10-CM | POA: Diagnosis not present

## 2017-02-24 DIAGNOSIS — F900 Attention-deficit hyperactivity disorder, predominantly inattentive type: Secondary | ICD-10-CM | POA: Diagnosis not present

## 2017-02-24 DIAGNOSIS — G4729 Other circadian rhythm sleep disorder: Secondary | ICD-10-CM | POA: Diagnosis not present

## 2017-02-24 DIAGNOSIS — F332 Major depressive disorder, recurrent severe without psychotic features: Secondary | ICD-10-CM | POA: Diagnosis not present

## 2017-02-25 DIAGNOSIS — G4729 Other circadian rhythm sleep disorder: Secondary | ICD-10-CM | POA: Diagnosis not present

## 2017-02-25 DIAGNOSIS — F332 Major depressive disorder, recurrent severe without psychotic features: Secondary | ICD-10-CM | POA: Diagnosis not present

## 2017-02-25 DIAGNOSIS — F410 Panic disorder [episodic paroxysmal anxiety] without agoraphobia: Secondary | ICD-10-CM | POA: Diagnosis not present

## 2017-02-25 DIAGNOSIS — F900 Attention-deficit hyperactivity disorder, predominantly inattentive type: Secondary | ICD-10-CM | POA: Diagnosis not present

## 2017-02-26 DIAGNOSIS — F900 Attention-deficit hyperactivity disorder, predominantly inattentive type: Secondary | ICD-10-CM | POA: Diagnosis not present

## 2017-02-26 DIAGNOSIS — F410 Panic disorder [episodic paroxysmal anxiety] without agoraphobia: Secondary | ICD-10-CM | POA: Diagnosis not present

## 2017-02-26 DIAGNOSIS — F332 Major depressive disorder, recurrent severe without psychotic features: Secondary | ICD-10-CM | POA: Diagnosis not present

## 2017-02-26 DIAGNOSIS — G4729 Other circadian rhythm sleep disorder: Secondary | ICD-10-CM | POA: Diagnosis not present

## 2017-02-27 DIAGNOSIS — F410 Panic disorder [episodic paroxysmal anxiety] without agoraphobia: Secondary | ICD-10-CM | POA: Diagnosis not present

## 2017-02-27 DIAGNOSIS — G4729 Other circadian rhythm sleep disorder: Secondary | ICD-10-CM | POA: Diagnosis not present

## 2017-02-27 DIAGNOSIS — F332 Major depressive disorder, recurrent severe without psychotic features: Secondary | ICD-10-CM | POA: Diagnosis not present

## 2017-02-27 DIAGNOSIS — F900 Attention-deficit hyperactivity disorder, predominantly inattentive type: Secondary | ICD-10-CM | POA: Diagnosis not present

## 2017-03-02 DIAGNOSIS — F332 Major depressive disorder, recurrent severe without psychotic features: Secondary | ICD-10-CM | POA: Diagnosis not present

## 2017-03-02 DIAGNOSIS — F900 Attention-deficit hyperactivity disorder, predominantly inattentive type: Secondary | ICD-10-CM | POA: Diagnosis not present

## 2017-03-02 DIAGNOSIS — F410 Panic disorder [episodic paroxysmal anxiety] without agoraphobia: Secondary | ICD-10-CM | POA: Diagnosis not present

## 2017-03-02 DIAGNOSIS — G4729 Other circadian rhythm sleep disorder: Secondary | ICD-10-CM | POA: Diagnosis not present

## 2017-03-03 DIAGNOSIS — G4729 Other circadian rhythm sleep disorder: Secondary | ICD-10-CM | POA: Diagnosis not present

## 2017-03-03 DIAGNOSIS — F332 Major depressive disorder, recurrent severe without psychotic features: Secondary | ICD-10-CM | POA: Diagnosis not present

## 2017-03-03 DIAGNOSIS — F410 Panic disorder [episodic paroxysmal anxiety] without agoraphobia: Secondary | ICD-10-CM | POA: Diagnosis not present

## 2017-03-03 DIAGNOSIS — F900 Attention-deficit hyperactivity disorder, predominantly inattentive type: Secondary | ICD-10-CM | POA: Diagnosis not present

## 2017-03-04 ENCOUNTER — Encounter: Payer: Self-pay | Admitting: Internal Medicine

## 2017-03-04 DIAGNOSIS — G4729 Other circadian rhythm sleep disorder: Secondary | ICD-10-CM | POA: Diagnosis not present

## 2017-03-04 DIAGNOSIS — F332 Major depressive disorder, recurrent severe without psychotic features: Secondary | ICD-10-CM | POA: Diagnosis not present

## 2017-03-04 DIAGNOSIS — F900 Attention-deficit hyperactivity disorder, predominantly inattentive type: Secondary | ICD-10-CM | POA: Diagnosis not present

## 2017-03-04 DIAGNOSIS — F410 Panic disorder [episodic paroxysmal anxiety] without agoraphobia: Secondary | ICD-10-CM | POA: Diagnosis not present

## 2017-03-05 DIAGNOSIS — F900 Attention-deficit hyperactivity disorder, predominantly inattentive type: Secondary | ICD-10-CM | POA: Diagnosis not present

## 2017-03-05 DIAGNOSIS — F332 Major depressive disorder, recurrent severe without psychotic features: Secondary | ICD-10-CM | POA: Diagnosis not present

## 2017-03-05 DIAGNOSIS — F410 Panic disorder [episodic paroxysmal anxiety] without agoraphobia: Secondary | ICD-10-CM | POA: Diagnosis not present

## 2017-03-05 DIAGNOSIS — G4729 Other circadian rhythm sleep disorder: Secondary | ICD-10-CM | POA: Diagnosis not present

## 2017-03-09 DIAGNOSIS — G4729 Other circadian rhythm sleep disorder: Secondary | ICD-10-CM | POA: Diagnosis not present

## 2017-03-09 DIAGNOSIS — F900 Attention-deficit hyperactivity disorder, predominantly inattentive type: Secondary | ICD-10-CM | POA: Diagnosis not present

## 2017-03-09 DIAGNOSIS — F410 Panic disorder [episodic paroxysmal anxiety] without agoraphobia: Secondary | ICD-10-CM | POA: Diagnosis not present

## 2017-03-09 DIAGNOSIS — F332 Major depressive disorder, recurrent severe without psychotic features: Secondary | ICD-10-CM | POA: Diagnosis not present

## 2017-03-11 DIAGNOSIS — F332 Major depressive disorder, recurrent severe without psychotic features: Secondary | ICD-10-CM | POA: Diagnosis not present

## 2017-03-11 DIAGNOSIS — F900 Attention-deficit hyperactivity disorder, predominantly inattentive type: Secondary | ICD-10-CM | POA: Diagnosis not present

## 2017-03-11 DIAGNOSIS — F410 Panic disorder [episodic paroxysmal anxiety] without agoraphobia: Secondary | ICD-10-CM | POA: Diagnosis not present

## 2017-03-11 DIAGNOSIS — G4729 Other circadian rhythm sleep disorder: Secondary | ICD-10-CM | POA: Diagnosis not present

## 2017-03-13 DIAGNOSIS — F332 Major depressive disorder, recurrent severe without psychotic features: Secondary | ICD-10-CM | POA: Diagnosis not present

## 2017-03-13 DIAGNOSIS — G4729 Other circadian rhythm sleep disorder: Secondary | ICD-10-CM | POA: Diagnosis not present

## 2017-03-13 DIAGNOSIS — F900 Attention-deficit hyperactivity disorder, predominantly inattentive type: Secondary | ICD-10-CM | POA: Diagnosis not present

## 2017-03-13 DIAGNOSIS — F410 Panic disorder [episodic paroxysmal anxiety] without agoraphobia: Secondary | ICD-10-CM | POA: Diagnosis not present

## 2017-03-17 DIAGNOSIS — G4729 Other circadian rhythm sleep disorder: Secondary | ICD-10-CM | POA: Diagnosis not present

## 2017-03-17 DIAGNOSIS — F410 Panic disorder [episodic paroxysmal anxiety] without agoraphobia: Secondary | ICD-10-CM | POA: Diagnosis not present

## 2017-03-17 DIAGNOSIS — F900 Attention-deficit hyperactivity disorder, predominantly inattentive type: Secondary | ICD-10-CM | POA: Diagnosis not present

## 2017-03-17 DIAGNOSIS — F332 Major depressive disorder, recurrent severe without psychotic features: Secondary | ICD-10-CM | POA: Diagnosis not present

## 2017-03-19 DIAGNOSIS — F332 Major depressive disorder, recurrent severe without psychotic features: Secondary | ICD-10-CM | POA: Diagnosis not present

## 2017-03-19 DIAGNOSIS — G4729 Other circadian rhythm sleep disorder: Secondary | ICD-10-CM | POA: Diagnosis not present

## 2017-03-19 DIAGNOSIS — F410 Panic disorder [episodic paroxysmal anxiety] without agoraphobia: Secondary | ICD-10-CM | POA: Diagnosis not present

## 2017-03-19 DIAGNOSIS — F900 Attention-deficit hyperactivity disorder, predominantly inattentive type: Secondary | ICD-10-CM | POA: Diagnosis not present

## 2017-03-25 DIAGNOSIS — F900 Attention-deficit hyperactivity disorder, predominantly inattentive type: Secondary | ICD-10-CM | POA: Diagnosis not present

## 2017-03-25 DIAGNOSIS — F332 Major depressive disorder, recurrent severe without psychotic features: Secondary | ICD-10-CM | POA: Diagnosis not present

## 2017-03-25 DIAGNOSIS — F410 Panic disorder [episodic paroxysmal anxiety] without agoraphobia: Secondary | ICD-10-CM | POA: Diagnosis not present

## 2017-03-25 DIAGNOSIS — G4729 Other circadian rhythm sleep disorder: Secondary | ICD-10-CM | POA: Diagnosis not present

## 2017-04-22 DIAGNOSIS — F3131 Bipolar disorder, current episode depressed, mild: Secondary | ICD-10-CM | POA: Diagnosis not present

## 2017-06-01 DIAGNOSIS — F341 Dysthymic disorder: Secondary | ICD-10-CM | POA: Diagnosis not present

## 2017-06-01 DIAGNOSIS — F900 Attention-deficit hyperactivity disorder, predominantly inattentive type: Secondary | ICD-10-CM | POA: Diagnosis not present

## 2017-06-25 ENCOUNTER — Encounter: Payer: Self-pay | Admitting: Psychiatry

## 2017-06-25 ENCOUNTER — Other Ambulatory Visit: Payer: Self-pay

## 2017-06-25 ENCOUNTER — Ambulatory Visit: Payer: BLUE CROSS/BLUE SHIELD | Admitting: Psychiatry

## 2017-06-25 VITALS — BP 163/93 | HR 88 | Temp 97.7°F | Wt 325.0 lb

## 2017-06-25 DIAGNOSIS — F332 Major depressive disorder, recurrent severe without psychotic features: Secondary | ICD-10-CM | POA: Diagnosis not present

## 2017-06-25 NOTE — Progress Notes (Signed)
This is an ECT evaluation and intake for 36 year old man referred by his outpatient psychiatrist.  Patient is complaining of chronic long-standing depression.  Mood feels down and negative all the time.  Frequent negative thoughts about himself.  Sleeps erratically and irregularly.  He has suicidal thoughts but without any intention or plan.  No hallucinations.  Feels unmotivated has no enjoyment or interest in any activities.  All of the symptoms have been present for years.  Sometimes they wax and wane but the current level of severity has been present for several years.  The patient admits that he uses marijuana daily several times a day and has been doing so for years.  Denies alcohol use or other drug use.  He is seeing Dr. Evelene CroonKaur for outpatient psychiatric treatment and has been doing so for quite a while.  He has been on multiple medications and says that nothing so far has made much of a difference.  He recently was started on lithium which he actually feels is making him worse.  He had transcranial magnetic stimulation treatment this summer with only slight and very transient improvement.  Patient lives by himself.  Sounds like he has minimal social contact although he claims he can find someone to drive him to and from treatment.  He lives on money he inherited from his mother.  Does not work does not get disability.  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  As near as I can tell this just means that he has irregular sleep patterns.  Substance abuse history: Note above that he uses marijuana daily and has been for years and seems to see this is no problem at all despite his complete lack of interest and lack of motivation.  Neatly dressed and groomed man looks his stated age.  Cooperative and pleasant.  Good eye contact.  Normal psychomotor activity.  Speech normal rate tone and volume.  Affect appeared euthymic  and appropriately reactive.  Thoughts.  Lucid with no loosening of associations or delusions.  Did not appear to be particularly sad.  Described himself as being severely depressed.  Has suicidal thoughts without any formed intention or plan.  Denies any hallucinations.  Alert and oriented x4.  Judgment and insight appear to be intact has capacity to make decisions.  Patient who has chronic mood symptoms that have been unresponsive to multiple attempts at biological treatment.  Referred for consideration of ECT.  Described to the patient the process of ECT in detail.  Given his diagnosis of mood disorder there is a chance of improvement but I could not tell him exactly how big it was.  Described risks and benefits including short-term memory loss and confusion headaches pain heart arrhythmia.  Patient is tentatively agreeable to a course of ECT.  We are tentatively scheduling a plan to begin on January 7.  Patient was given the order form to get all of the basic labs done.  I will communicate with his outpatient psychiatrist.  Patient has been instructed to discontinue taking lithium at least a week prior to treatment and not to take his Xanax the night before over the morning of treatment.  I will let him continue his lamotrigine for now hoping that we can overcome that since he thinks that has been more of a help.  Patient agrees to plan

## 2017-07-15 ENCOUNTER — Inpatient Hospital Stay: Admission: RE | Admit: 2017-07-15 | Payer: Self-pay | Source: Ambulatory Visit

## 2017-07-17 ENCOUNTER — Encounter
Admission: RE | Admit: 2017-07-17 | Discharge: 2017-07-17 | Disposition: A | Discharge status: Home / Self Care | Payer: BLUE CROSS/BLUE SHIELD | Source: Ambulatory Visit | Attending: Psychiatry | Admitting: Psychiatry

## 2017-07-17 ENCOUNTER — Ambulatory Visit
Admission: RE | Admit: 2017-07-17 | Discharge: 2017-07-17 | Disposition: A | Discharge status: Home / Self Care | Payer: BLUE CROSS/BLUE SHIELD | Source: Ambulatory Visit | Attending: Psychiatry | Admitting: Psychiatry

## 2017-07-17 ENCOUNTER — Telehealth (HOSPITAL_COMMUNITY): Payer: Self-pay | Admitting: *Deleted

## 2017-07-17 DIAGNOSIS — F332 Major depressive disorder, recurrent severe without psychotic features: Secondary | ICD-10-CM | POA: Diagnosis not present

## 2017-07-17 DIAGNOSIS — F319 Bipolar disorder, unspecified: Secondary | ICD-10-CM | POA: Insufficient documentation

## 2017-07-17 DIAGNOSIS — F338 Other recurrent depressive disorders: Secondary | ICD-10-CM | POA: Diagnosis not present

## 2017-07-17 DIAGNOSIS — R45851 Suicidal ideations: Secondary | ICD-10-CM | POA: Diagnosis not present

## 2017-07-17 DIAGNOSIS — K589 Irritable bowel syndrome without diarrhea: Secondary | ICD-10-CM | POA: Diagnosis not present

## 2017-07-17 DIAGNOSIS — Z01818 Encounter for other preprocedural examination: Secondary | ICD-10-CM | POA: Diagnosis not present

## 2017-07-17 DIAGNOSIS — K219 Gastro-esophageal reflux disease without esophagitis: Secondary | ICD-10-CM | POA: Insufficient documentation

## 2017-07-17 DIAGNOSIS — F419 Anxiety disorder, unspecified: Secondary | ICD-10-CM | POA: Insufficient documentation

## 2017-07-17 DIAGNOSIS — Z01810 Encounter for preprocedural cardiovascular examination: Secondary | ICD-10-CM | POA: Diagnosis not present

## 2017-07-17 DIAGNOSIS — F909 Attention-deficit hyperactivity disorder, unspecified type: Secondary | ICD-10-CM | POA: Diagnosis not present

## 2017-07-17 HISTORY — DX: Depression, unspecified: F32.A

## 2017-07-17 HISTORY — DX: Gastro-esophageal reflux disease without esophagitis: K21.9

## 2017-07-17 HISTORY — DX: Major depressive disorder, single episode, unspecified: F32.9

## 2017-07-17 HISTORY — DX: Anxiety disorder, unspecified: F41.9

## 2017-07-17 LAB — URINALYSIS, ROUTINE W REFLEX MICROSCOPIC
BILIRUBIN URINE: NEGATIVE
GLUCOSE, UA: NEGATIVE mg/dL
HGB URINE DIPSTICK: NEGATIVE
KETONES UR: NEGATIVE mg/dL
Leukocytes, UA: NEGATIVE
NITRITE: NEGATIVE
PH: 5 (ref 5.0–8.0)
Protein, ur: NEGATIVE mg/dL
Specific Gravity, Urine: 1.025 (ref 1.005–1.030)

## 2017-07-17 LAB — BASIC METABOLIC PANEL
Anion gap: 10 (ref 5–15)
BUN: 17 mg/dL (ref 6–20)
CALCIUM: 8.6 mg/dL — AB (ref 8.9–10.3)
CO2: 24 mmol/L (ref 22–32)
CREATININE: 0.91 mg/dL (ref 0.61–1.24)
Chloride: 104 mmol/L (ref 101–111)
GFR calc non Af Amer: >60 mL/min (ref >60)
GLUCOSE: 95 mg/dL (ref 65–99)
Potassium: 3.7 mmol/L (ref 3.5–5.1)
Sodium: 138 mmol/L (ref 135–145)

## 2017-07-17 LAB — CBC
HEMATOCRIT: 44.4 % (ref 40.0–52.0)
Hemoglobin: 14.7 g/dL (ref 13.0–18.0)
MCH: 28.6 pg (ref 26.0–34.0)
MCHC: 33.2 g/dL (ref 32.0–36.0)
MCV: 86 fL (ref 80.0–100.0)
Platelets: 199 10*3/uL (ref 150–440)
RBC: 5.16 MIL/uL (ref 4.40–5.90)
RDW: 14.9 % — AB (ref 11.5–14.5)
WBC: 9.3 10*3/uL (ref 3.8–10.6)

## 2017-07-17 NOTE — Telephone Encounter (Signed)
Called for Prior authorization of ECT. Spoke with Bonita QuinLinda who states that it must be sent for physician review due to Marijuana use. A decision will be made no later than 07/20/17.

## 2017-07-17 NOTE — Pre-Procedure Instructions (Signed)
Patient has been informed of the procedure and the process of ECT. He has been given the booklet on ECT. When asked if he had any questions the patient stated ,"no". He has given the New patient instructions and given and appointment of 8:30 Monday. The patient verbalized understanding.

## 2017-07-19 ENCOUNTER — Other Ambulatory Visit: Payer: Self-pay | Admitting: Psychiatry

## 2017-07-20 ENCOUNTER — Encounter: Payer: Self-pay | Admitting: Anesthesiology

## 2017-07-20 ENCOUNTER — Encounter
Admission: RE | Admit: 2017-07-20 | Discharge: 2017-07-20 | Disposition: A | Discharge status: Home / Self Care | Payer: BLUE CROSS/BLUE SHIELD | Source: Ambulatory Visit | Attending: Psychiatry | Admitting: Psychiatry

## 2017-07-20 DIAGNOSIS — Z87891 Personal history of nicotine dependence: Secondary | ICD-10-CM

## 2017-07-20 DIAGNOSIS — F332 Major depressive disorder, recurrent severe without psychotic features: Secondary | ICD-10-CM

## 2017-07-20 DIAGNOSIS — F339 Major depressive disorder, recurrent, unspecified: Secondary | ICD-10-CM | POA: Diagnosis not present

## 2017-07-20 DIAGNOSIS — K219 Gastro-esophageal reflux disease without esophagitis: Secondary | ICD-10-CM | POA: Diagnosis not present

## 2017-07-20 DIAGNOSIS — F319 Bipolar disorder, unspecified: Secondary | ICD-10-CM | POA: Diagnosis not present

## 2017-07-20 DIAGNOSIS — Z01818 Encounter for other preprocedural examination: Secondary | ICD-10-CM

## 2017-07-20 DIAGNOSIS — F909 Attention-deficit hyperactivity disorder, unspecified type: Secondary | ICD-10-CM | POA: Diagnosis not present

## 2017-07-20 DIAGNOSIS — F338 Other recurrent depressive disorders: Secondary | ICD-10-CM | POA: Diagnosis not present

## 2017-07-20 DIAGNOSIS — K589 Irritable bowel syndrome without diarrhea: Secondary | ICD-10-CM | POA: Diagnosis not present

## 2017-07-20 DIAGNOSIS — R45851 Suicidal ideations: Secondary | ICD-10-CM | POA: Diagnosis not present

## 2017-07-20 DIAGNOSIS — F419 Anxiety disorder, unspecified: Secondary | ICD-10-CM | POA: Diagnosis not present

## 2017-07-20 DIAGNOSIS — F418 Other specified anxiety disorders: Secondary | ICD-10-CM | POA: Diagnosis not present

## 2017-07-20 LAB — URINE DRUG SCREEN, QUALITATIVE (ARMC ONLY)
Amphetamines, Ur Screen: NOT DETECTED
BARBITURATES, UR SCREEN: NOT DETECTED
Benzodiazepine, Ur Scrn: NOT DETECTED
CANNABINOID 50 NG, UR ~~LOC~~: POSITIVE — AB
COCAINE METABOLITE, UR ~~LOC~~: NOT DETECTED
MDMA (Ecstasy)Ur Screen: NOT DETECTED
Methadone Scn, Ur: NOT DETECTED
Opiate, Ur Screen: NOT DETECTED
PHENCYCLIDINE (PCP) UR S: NOT DETECTED
Tricyclic, Ur Screen: NOT DETECTED

## 2017-07-20 MED ORDER — KETOROLAC TROMETHAMINE 30 MG/ML IJ SOLN
30.0000 mg | Freq: Once | INTRAMUSCULAR | Status: AC
  Administered 2017-07-20: 30 mg via INTRAVENOUS

## 2017-07-20 MED ORDER — MIDAZOLAM HCL 2 MG/2ML IJ SOLN
INTRAMUSCULAR | Status: DC | PRN
  Administered 2017-07-20: 2 mg via INTRAVENOUS

## 2017-07-20 MED ORDER — MIDAZOLAM HCL 2 MG/2ML IJ SOLN: 2.0000 mg | Freq: Once | INTRAMUSCULAR | Status: DC

## 2017-07-20 MED ORDER — MIDAZOLAM HCL 2 MG/2ML IJ SOLN
INTRAMUSCULAR | Status: AC
  Filled 2017-07-20: qty 2

## 2017-07-20 MED ORDER — SUCCINYLCHOLINE CHLORIDE 20 MG/ML IJ SOLN
INTRAMUSCULAR | Status: DC | PRN
  Administered 2017-07-20: 150 mg via INTRAVENOUS

## 2017-07-20 MED ORDER — SODIUM CHLORIDE 0.9 % IV SOLN
INTRAVENOUS | Status: DC | PRN
  Administered 2017-07-20: 11:00:00 via INTRAVENOUS

## 2017-07-20 MED ORDER — METHOHEXITAL SODIUM 100 MG/10ML IV SOSY
PREFILLED_SYRINGE | INTRAVENOUS | Status: DC | PRN
  Administered 2017-07-20: 120 mg via INTRAVENOUS

## 2017-07-20 MED ORDER — SODIUM CHLORIDE 0.9 % IV SOLN
500.0000 mL | Freq: Once | INTRAVENOUS | Status: AC
  Administered 2017-07-20: 500 mL via INTRAVENOUS

## 2017-07-20 MED ORDER — KETOROLAC TROMETHAMINE 30 MG/ML IJ SOLN
INTRAMUSCULAR | Status: AC
  Filled 2017-07-20: qty 1

## 2017-07-20 NOTE — Anesthesia Preprocedure Evaluation (Addendum)
Anesthesia Evaluation  Patient identified by MRN, date of birth, ID band Patient awake    Reviewed: Allergy & Precautions, H&P , NPO status , Patient's Chart, lab work & pertinent test results  History of Anesthesia Complications Negative for: history of anesthetic complications  Airway Mallampati: III  TM Distance: <3 FB Neck ROM: full    Dental  (+) Chipped, Poor Dentition   Pulmonary neg shortness of breath, sleep apnea , former smoker,           Cardiovascular Exercise Tolerance: Good (-) angina(-) Past MI and (-) DOE negative cardio ROS       Neuro/Psych PSYCHIATRIC DISORDERS Anxiety Depression Bipolar Disorder negative neurological ROS     GI/Hepatic Neg liver ROS, GERD  Medicated and Controlled,  Endo/Other  negative endocrine ROS  Renal/GU negative Renal ROS  negative genitourinary   Musculoskeletal   Abdominal   Peds  Hematology negative hematology ROS (+)   Anesthesia Other Findings Past Medical History: No date: Acne No date: ADD (attention deficit disorder) No date: ADHD (attention deficit hyperactivity disorder) No date: Allergy No date: Anxiety No date: Bipolar 1 disorder (HCC) No date: Depression No date: GERD (gastroesophageal reflux disease) No date: IBS (irritable bowel syndrome)  Past Surgical History: 1993: MOUTH SURGERY     Comment:  skin graft No date: WISDOM TOOTH EXTRACTION  BMI    Body Mass Index:  46.20 kg/m      Reproductive/Obstetrics negative OB ROS                             Anesthesia Physical  Anesthesia Plan  ASA: III  Anesthesia Plan: General   Post-op Pain Management:    Induction: Intravenous  PONV Risk Score and Plan: TIVA  Airway Management Planned: Natural Airway and Mask  Additional Equipment:   Intra-op Plan:   Post-operative Plan:   Informed Consent: I have reviewed the patients History and Physical, chart, labs  and discussed the procedure including the risks, benefits and alternatives for the proposed anesthesia with the patient or authorized representative who has indicated his/her understanding and acceptance.   Dental Advisory Given  Plan Discussed with: Anesthesiologist, CRNA and Surgeon  Anesthesia Plan Comments: (Patient consented for risks of anesthesia including but not limited to:  - adverse reactions to medications - risk of intubation if required - damage to teeth, lips or other oral mucosa - sore throat or hoarseness - Damage to heart, brain, lungs or loss of life  Patient voiced understanding.)        Anesthesia Quick Evaluation  

## 2017-07-20 NOTE — Anesthesia Procedure Notes (Signed)
Procedure Name: General with mask airway Performed by: Karoline CaldwellStarr, Anjelo Pullman, CRNA Pre-anesthesia Checklist: Patient identified, Patient being monitored, Timeout performed, Emergency Drugs available and Suction available Patient Re-evaluated:Patient Re-evaluated prior to induction Oxygen Delivery Method: Circle system utilized Preoxygenation: Pre-oxygenation with 100% oxygen Induction Type: IV induction Ventilation: Mask ventilation without difficulty Airway Equipment and Method: Bite block Placement Confirmation: positive ETCO2 and breath sounds checked- equal and bilateral Dental Injury: Teeth and Oropharynx as per pre-operative assessment

## 2017-07-20 NOTE — Anesthesia Post-op Follow-up Note (Signed)
Anesthesia QCDR form completed.        

## 2017-07-20 NOTE — Transfer of Care (Signed)
Immediate Anesthesia Transfer of Care Note  Patient: Herbert GuernseyMichael O Routon  Procedure(s) Performed: ECT TX  Patient Location: PACU  Anesthesia Type:General  Level of Consciousness: awake, alert  and oriented  Airway & Oxygen Therapy: Patient Spontanous Breathing and Patient connected to face mask oxygen  Post-op Assessment: Report given to RN and Post -op Vital signs reviewed and stable  Post vital signs: Reviewed and stable  Last Vitals:  Vitals:   07/20/17 0916 07/20/17 1115  BP: (!) 149/90 (!) 111/94  Pulse: 68 (!) 106  Resp: 16 (!) 21  Temp: 36.7 C 36.6 C  SpO2: 98% 98%    Last Pain:  Vitals:   07/20/17 0916  TempSrc: Oral         Complications: No apparent anesthesia complications

## 2017-07-20 NOTE — H&P (Signed)
Herbert Bryant is an 37 y.o. male.   Chief Complaint: Patient with severe chronic depression passive suicidal ideation HPI: History of recurrent severe depression unresponsive to medication  Past Medical History:  Diagnosis Date  . Acne   . ADD (attention deficit disorder)   . ADHD (attention deficit hyperactivity disorder)   . Allergy   . Anxiety   . Bipolar 1 disorder (HCC)   . Depression   . GERD (gastroesophageal reflux disease)   . IBS (irritable bowel syndrome)     Past Surgical History:  Procedure Laterality Date  . MOUTH SURGERY  1993   skin graft  . WISDOM TOOTH EXTRACTION      Family History  Problem Relation Age of Onset  . Arthritis Maternal Grandmother   . Stroke Maternal Grandmother   . Hypertension Maternal Grandmother   . Arthritis Maternal Grandfather   . Emphysema Paternal Grandfather   . Alcohol abuse Mother   . Depression Mother    Social History:  reports that he quit smoking about 5 years ago. His smoking use included cigarettes. He smoked 0.50 packs per day. he has never used smokeless tobacco. He reports that he uses drugs. Drug: Marijuana. He reports that he does not drink alcohol.  Allergies: No Known Allergies   (Not in a hospital admission)  Results for orders placed or performed during the hospital encounter of 07/20/17 (from the past 48 hour(s))  Urine Drug Screen, Qualitative ([facility] only)     Status: Abnormal   Collection Time: 07/20/17  9:18 AM  Result Value Ref Range   Tricyclic, Ur Screen NONE DETECTED NONE DETECTED   Amphetamines, Ur Screen NONE DETECTED NONE DETECTED   MDMA (Ecstasy)Ur Screen NONE DETECTED NONE DETECTED   Cocaine Metabolite,Ur [place] NONE DETECTED NONE DETECTED   Opiate, Ur Screen NONE DETECTED NONE DETECTED   Phencyclidine (PCP) Ur S NONE DETECTED NONE DETECTED   Cannabinoid 50 Ng, Ur [place] POSITIVE (A) NONE DETECTED   Barbiturates, Ur Screen NONE DETECTED NONE DETECTED   Benzodiazepine, Ur Scrn NONE DETECTED NONE  DETECTED   Methadone Scn, Ur NONE DETECTED NONE DETECTED    Comment: (NOTE) Tricyclics + metabolites, urine    Cutoff 1000 ng/mL Amphetamines + metabolites, urine  Cutoff 1000 ng/mL MDMA (Ecstasy), urine              Cutoff 500 ng/mL Cocaine Metabolite, urine          Cutoff 300 ng/mL Opiate + metabolites, urine        Cutoff 300 ng/mL Phencyclidine (PCP), urine         Cutoff 25 ng/mL Cannabinoid, urine                 Cutoff 50 ng/mL Barbiturates + metabolites, urine  Cutoff 200 ng/mL Benzodiazepine, urine              Cutoff 200 ng/mL Methadone, urine                   Cutoff 300 ng/mL The urine drug screen provides only a preliminary, unconfirmed analytical test result and should not be used for non-medical purposes. Clinical consideration and professional judgment should be applied to any positive drug screen result due to possible interfering substances. A more specific alternate chemical method must be used in order to obtain a confirmed analytical result. Gas chromatography / mass spectrometry (GC/MS) is the preferred confirmat ory method. Performed at [facility], [address] [place], [place] [address]  No results found.  Review of Systems  Constitutional: Negative.   HENT: Negative.   Eyes: Negative.   Respiratory: Negative.   Cardiovascular: Negative.   Gastrointestinal: Negative.   Musculoskeletal: Negative.   Skin: Negative.   Neurological: Negative.   Psychiatric/Behavioral: Positive for depression and suicidal ideas. Negative for hallucinations, memory loss and substance abuse. The patient is nervous/anxious. The patient does not have insomnia.     Blood pressure (!) 149/90, pulse 68, temperature 98 F (36.7 C), temperature source Oral, resp. rate 16, height 5\' 10"  (1.778 m), weight (!) 146.1 kg (322 lb), SpO2 98 %. Physical Exam  Nursing note and vitals reviewed. Constitutional: He appears well-developed and well-nourished.  HENT:  Head:  Normocephalic and atraumatic.  Eyes: Conjunctivae are normal. Pupils are equal, round, and reactive to light.  Neck: Normal range of motion.  Cardiovascular: Regular rhythm and normal heart sounds.  Respiratory: Effort normal. No respiratory distress.  GI: Soft.  Musculoskeletal: Normal range of motion.  Neurological: He is alert.  Skin: Skin is warm and dry.  Psychiatric: His speech is normal and behavior is normal. Judgment and thought content normal. Cognition and memory are normal. He exhibits a depressed mood.     Assessment/Plan Beginning treatment ECT today bilateral treatment.  Monday Wednesday and Friday.  Outpatient.  [name], MD 07/20/2017, 10:55 AM

## 2017-07-20 NOTE — Discharge Instructions (Signed)
1)  The drugs that you have been given will stay in your system until tomorrow so for the       next 24 hours you should not:  A. Drive an automobile  B. Make any legal decisions  C. Drink any alcoholic beverages  2)  You may resume your regular meals upon return home.  3)  A responsible adult must take you home.  Someone should stay with you for a few          hours, then be available by phone for the remainder of the treatment day.  4)  You May experience any of the following symptoms:  Headache, Nausea and a dry mouth (due to the medications you were given),  temporary memory loss and some confusion, or sore muscles (a warm bath  should help this).  If you you experience any of these symptoms let us know on                your return visit.  5)  Report any of the following: any acute discomfort, severe headache, or temperature        greater than 100.5 F.   Also report any unusual redness, swelling, drainage, or pain         at your IV site.    You may report Symptoms to:  ECT PROGRAM- [place] [facility]          Phone: [phone], ECT Department           or Dr. Shary Keylapac's office [phone]  6)  Your next ECT Treatment is Wednesday July 22 898  We will call 2 days prior to your scheduled appointment for arrival times.  7)  Nothing to eat or drink after midnight the night before your procedure.  8)  Take      With a sip of water the morning of your procedure.  9)  Other Instructions: Call [phone] to cancel the morning of your procedure due         to illness or emergency.  10) We will call within 72 hours to assess how you are feeling.

## 2017-07-20 NOTE — Procedures (Signed)
ECT SERVICES Physician's Interval Evaluation & Treatment Note  Patient Identification: Melany GuernseyMichael O Brindle MRN:  [id number] Date of Evaluation:  07/20/2017 TX #: 1  MADRS: 32  MMSE: 28  P.E. Findings:  1.0 ms 25%  Psychiatric Interval Note:  Patient with chronic recurrent depression and suicidal ideation  Subjective:  Patient is a 37 y.o. male seen for evaluation for Electroconvulsive Therapy. Anxious  Treatment Summary:   []   Right Unilateral             [x]  Bilateral   % Energy : 1.0 ms 25%   Impedance: 990 ohms  Seizure Energy Index: 24,604 V squared  Postictal Suppression Index: 93%  Seizure Concordance Index: 99%  Medications  Pre Shock: Toradol 30 mg Brevital 120 mg succinylcholine 150 mg  Post Shock: Versed 2 mg  Seizure Duration: 50 seconds by EMG 102 seconds by EEG   Comments: Follow-up usual Monday Wednesday Friday schedule as an outpatient  Lungs:  [x]   Clear to auscultation               []  Other:   Heart:    [x]   Regular rhythm             []  irregular rhythm    [x]   Previous H&P reviewed, patient examined and there are NO CHANGES                 []   Previous H&P reviewed, patient examined and there are changes noted.   Mordecai RasmussenJohn Saryn Cherry, MD 1/7/201910:57 AM

## 2017-07-21 ENCOUNTER — Other Ambulatory Visit: Payer: Self-pay | Admitting: Psychiatry

## 2017-07-21 ENCOUNTER — Telehealth (HOSPITAL_COMMUNITY): Payer: Self-pay | Admitting: *Deleted

## 2017-07-21 NOTE — Telephone Encounter (Signed)
Received call from [facility] stating that 12 sessions for ECT have been approved from 07/20/17-08/14/17. Auth #[id number] Per Debara Pickettonnie K. Authorization was given after MD consults and approved by Graciela HusbandsLinda Evans MD.

## 2017-07-21 NOTE — Anesthesia Postprocedure Evaluation (Signed)
Anesthesia Post Note  Patient: Herbert Bryant  Procedure(s) Performed: ECT TX  Patient location during evaluation: PACU Anesthesia Type: General Level of consciousness: awake and alert Pain management: pain level controlled Vital Signs Assessment: post-procedure vital signs reviewed and stable Respiratory status: spontaneous breathing, nonlabored ventilation, respiratory function stable and patient connected to nasal cannula oxygen Cardiovascular status: blood pressure returned to baseline and stable Postop Assessment: no apparent nausea or vomiting Anesthetic complications: no     Last Vitals:  Vitals:   07/20/17 1145 07/20/17 1204  BP: 137/88 (!) 122/97  Pulse: 93 65  Resp:  16  Temp: 36.6 C   SpO2: 98%     Last Pain:  Vitals:   07/20/17 1204  TempSrc:   PainSc: 1                  Cleda MccreedyJoseph K Gevon Markus

## 2017-07-22 ENCOUNTER — Encounter (HOSPITAL_BASED_OUTPATIENT_CLINIC_OR_DEPARTMENT_OTHER)
Admission: RE | Admit: 2017-07-22 | Discharge: 2017-07-22 | Disposition: A | Discharge status: Home / Self Care | Payer: BLUE CROSS/BLUE SHIELD | Source: Ambulatory Visit | Attending: Psychiatry | Admitting: Psychiatry

## 2017-07-22 ENCOUNTER — Encounter: Payer: Self-pay | Admitting: Emergency Medicine

## 2017-07-22 ENCOUNTER — Emergency Department
Admission: EM | Admit: 2017-07-22 | Discharge: 2017-07-22 | Disposition: A | Discharge status: Home / Self Care | Payer: BLUE CROSS/BLUE SHIELD | Attending: Student in an Organized Health Care Education/Training Program | Admitting: Student in an Organized Health Care Education/Training Program

## 2017-07-22 ENCOUNTER — Encounter: Payer: Self-pay | Admitting: Anesthesiology

## 2017-07-22 ENCOUNTER — Other Ambulatory Visit: Payer: Self-pay

## 2017-07-22 DIAGNOSIS — K219 Gastro-esophageal reflux disease without esophagitis: Secondary | ICD-10-CM | POA: Diagnosis not present

## 2017-07-22 DIAGNOSIS — F332 Major depressive disorder, recurrent severe without psychotic features: Secondary | ICD-10-CM

## 2017-07-22 DIAGNOSIS — F909 Attention-deficit hyperactivity disorder, unspecified type: Secondary | ICD-10-CM | POA: Insufficient documentation

## 2017-07-22 DIAGNOSIS — Z87891 Personal history of nicotine dependence: Secondary | ICD-10-CM | POA: Diagnosis not present

## 2017-07-22 DIAGNOSIS — F05 Delirium due to known physiological condition: Secondary | ICD-10-CM | POA: Insufficient documentation

## 2017-07-22 DIAGNOSIS — Z79899 Other long term (current) drug therapy: Secondary | ICD-10-CM | POA: Insufficient documentation

## 2017-07-22 DIAGNOSIS — K589 Irritable bowel syndrome without diarrhea: Secondary | ICD-10-CM | POA: Diagnosis not present

## 2017-07-22 DIAGNOSIS — R45851 Suicidal ideations: Secondary | ICD-10-CM | POA: Diagnosis not present

## 2017-07-22 DIAGNOSIS — R4689 Other symptoms and signs involving appearance and behavior: Secondary | ICD-10-CM | POA: Diagnosis not present

## 2017-07-22 DIAGNOSIS — R451 Restlessness and agitation: Secondary | ICD-10-CM

## 2017-07-22 DIAGNOSIS — F419 Anxiety disorder, unspecified: Secondary | ICD-10-CM | POA: Diagnosis not present

## 2017-07-22 DIAGNOSIS — F319 Bipolar disorder, unspecified: Secondary | ICD-10-CM | POA: Diagnosis not present

## 2017-07-22 DIAGNOSIS — F339 Major depressive disorder, recurrent, unspecified: Secondary | ICD-10-CM | POA: Diagnosis not present

## 2017-07-22 DIAGNOSIS — F338 Other recurrent depressive disorders: Secondary | ICD-10-CM | POA: Diagnosis not present

## 2017-07-22 LAB — CBC WITH DIFFERENTIAL/PLATELET
Basophils Absolute: 0 10*3/uL (ref 0–0.1)
Basophils Relative: 0 %
EOS ABS: 0.1 10*3/uL (ref 0–0.7)
EOS PCT: 2 %
HCT: 39.5 % — ABNORMAL LOW (ref 40.0–52.0)
Hemoglobin: 13.1 g/dL (ref 13.0–18.0)
LYMPHS ABS: 1.1 10*3/uL (ref 1.0–3.6)
Lymphocytes Relative: 13 %
MCH: 29 pg (ref 26.0–34.0)
MCHC: 33.1 g/dL (ref 32.0–36.0)
MCV: 87.7 fL (ref 80.0–100.0)
MONOS PCT: 6 %
Monocytes Absolute: 0.5 10*3/uL (ref 0.2–1.0)
Neutro Abs: 6.7 10*3/uL — ABNORMAL HIGH (ref 1.4–6.5)
Neutrophils Relative %: 79 %
PLATELETS: 164 10*3/uL (ref 150–440)
RBC: 4.5 MIL/uL (ref 4.40–5.90)
RDW: 14.5 % (ref 11.5–14.5)
WBC: 8.4 10*3/uL (ref 3.8–10.6)

## 2017-07-22 LAB — BASIC METABOLIC PANEL
Anion gap: 11 (ref 5–15)
BUN: 11 mg/dL (ref 6–20)
CHLORIDE: 107 mmol/L (ref 101–111)
CO2: 22 mmol/L (ref 22–32)
Calcium: 7.9 mg/dL — ABNORMAL LOW (ref 8.9–10.3)
Creatinine, Ser: 0.83 mg/dL (ref 0.61–1.24)
GFR calc Af Amer: >60 mL/min (ref >60)
GFR calc non Af Amer: >60 mL/min (ref >60)
GLUCOSE: 103 mg/dL — AB (ref 65–99)
Potassium: 3.4 mmol/L — ABNORMAL LOW (ref 3.5–5.1)
SODIUM: 140 mmol/L (ref 135–145)

## 2017-07-22 MED ORDER — HALOPERIDOL LACTATE 5 MG/ML IJ SOLN
INTRAMUSCULAR | Status: AC
  Administered 2017-07-22: 5 mg via INTRAVENOUS
  Filled 2017-07-22: qty 1

## 2017-07-22 MED ORDER — SODIUM CHLORIDE 0.9 % IV SOLN
500.0000 mL | Freq: Once | INTRAVENOUS | Status: AC
  Administered 2017-07-22: 500 mL via INTRAVENOUS

## 2017-07-22 MED ORDER — MIDAZOLAM HCL 2 MG/2ML IJ SOLN
INTRAMUSCULAR | Status: AC
  Filled 2017-07-22: qty 4

## 2017-07-22 MED ORDER — KETOROLAC TROMETHAMINE 30 MG/ML IJ SOLN
30.0000 mg | Freq: Once | INTRAMUSCULAR | Status: AC
  Administered 2017-07-22: 30 mg via INTRAVENOUS

## 2017-07-22 MED ORDER — HALOPERIDOL LACTATE 5 MG/ML IJ SOLN
INTRAMUSCULAR | Status: DC | PRN
  Administered 2017-07-22: 5 mg via INTRAVENOUS

## 2017-07-22 MED ORDER — LORAZEPAM 2 MG/ML IJ SOLN
INTRAMUSCULAR | Status: AC
  Administered 2017-07-22: 4 mg via INTRAVENOUS
  Filled 2017-07-22: qty 2

## 2017-07-22 MED ORDER — HALOPERIDOL LACTATE 5 MG/ML IJ SOLN: 5.0000 mg | Freq: Four times a day (QID) | INTRAMUSCULAR | Status: DC | PRN

## 2017-07-22 MED ORDER — SODIUM CHLORIDE FLUSH 0.9 % IV SOLN
INTRAVENOUS | Status: AC
  Filled 2017-07-22: qty 10

## 2017-07-22 MED ORDER — MIDAZOLAM HCL 2 MG/2ML IJ SOLN
4.0000 mg | Freq: Once | INTRAMUSCULAR | Status: AC
  Administered 2017-07-22 (×2): 4 mg via INTRAVENOUS

## 2017-07-22 MED ORDER — LORAZEPAM 2 MG/ML IJ SOLN: 4.0000 mg | Freq: Once | INTRAMUSCULAR | Status: DC

## 2017-07-22 MED ORDER — SUCCINYLCHOLINE CHLORIDE 20 MG/ML IJ SOLN
INTRAMUSCULAR | Status: AC
  Filled 2017-07-22: qty 1

## 2017-07-22 MED ORDER — LORAZEPAM 2 MG/ML IJ SOLN
INTRAMUSCULAR | Status: AC
  Administered 2017-07-22: 2 mg via INTRAVENOUS
  Filled 2017-07-22: qty 2

## 2017-07-22 MED ORDER — ZIPRASIDONE MESYLATE 20 MG IM SOLR
20.0000 mg | Freq: Once | INTRAMUSCULAR | Status: AC
  Administered 2017-07-22: 20 mg via INTRAMUSCULAR
  Filled 2017-07-22: qty 20

## 2017-07-22 MED ORDER — SUCCINYLCHOLINE CHLORIDE 200 MG/10ML IV SOSY
PREFILLED_SYRINGE | INTRAVENOUS | Status: DC | PRN
  Administered 2017-07-22: 150 mg via INTRAVENOUS

## 2017-07-22 MED ORDER — ONDANSETRON HCL 4 MG/2ML IJ SOLN: 4.0000 mg | Freq: Once | INTRAMUSCULAR | Status: DC | PRN

## 2017-07-22 MED ORDER — KETOROLAC TROMETHAMINE 30 MG/ML IJ SOLN
INTRAMUSCULAR | Status: AC
  Filled 2017-07-22: qty 1

## 2017-07-22 MED ORDER — MIDAZOLAM HCL 2 MG/2ML IJ SOLN: 4.0000 mg | Freq: Once | INTRAMUSCULAR | Status: DC

## 2017-07-22 MED ORDER — HALOPERIDOL LACTATE 5 MG/ML IJ SOLN
5.0000 mg | Freq: Once | INTRAMUSCULAR | Status: AC
  Administered 2017-07-22 (×2): 5 mg via INTRAVENOUS

## 2017-07-22 MED ORDER — LORAZEPAM 2 MG/ML IJ SOLN
4.0000 mg | Freq: Once | INTRAMUSCULAR | Status: AC
  Administered 2017-07-22: 4 mg via INTRAVENOUS

## 2017-07-22 MED ORDER — METOPROLOL TARTRATE 5 MG/5ML IV SOLN
INTRAVENOUS | Status: AC
  Administered 2017-07-22: 3 mg
  Filled 2017-07-22: qty 5

## 2017-07-22 MED ORDER — FENTANYL CITRATE (PF) 100 MCG/2ML IJ SOLN: 25.0000 ug | INTRAMUSCULAR | Status: DC | PRN

## 2017-07-22 MED ORDER — SODIUM CHLORIDE FLUSH 0.9 % IV SOLN
INTRAVENOUS | Status: AC
  Filled 2017-07-22: qty 20

## 2017-07-22 MED ORDER — LORAZEPAM 2 MG/ML IJ SOLN
4.0000 mg | Freq: Once | INTRAMUSCULAR | Status: AC
  Administered 2017-07-22: 2 mg via INTRAVENOUS

## 2017-07-22 MED ORDER — MIDAZOLAM HCL 2 MG/2ML IJ SOLN
INTRAMUSCULAR | Status: AC
  Administered 2017-07-22: 4 mg via INTRAVENOUS
  Filled 2017-07-22: qty 4

## 2017-07-22 MED ORDER — METHOHEXITAL SODIUM 100 MG/10ML IV SOSY
PREFILLED_SYRINGE | INTRAVENOUS | Status: DC | PRN
  Administered 2017-07-22: 120 mg via INTRAVENOUS

## 2017-07-22 MED ORDER — SODIUM CHLORIDE 0.9 % IV SOLN
INTRAVENOUS | Status: DC | PRN
  Administered 2017-07-22: 09:00:00 via INTRAVENOUS

## 2017-07-22 NOTE — Procedures (Signed)
ECT SERVICES Physician's Interval Evaluation & Treatment Note  Patient Identification: Herbert Bryant MRN:  [id number] Date of Evaluation:  07/22/2017 TX #: 2  MADRS:   MMSE:   P.E. Findings:  No change to physical exam heart and lungs normal.  No physical complaints  Psychiatric Interval Note:  Mood continues to be about the same no particular improvement or worsening  Subjective:  Patient is a 37 y.o. male seen for evaluation for Electroconvulsive Therapy. Chronic severe depression and passive suicidal ideation no psychosis  Treatment Summary:   []   Right Unilateral             [x]  Bilateral   % Energy : 1.0 ms, 25%   Impedance: 1290 ohms  Seizure Energy Index: 26,581 V squared  Postictal Suppression Index: 88%  Seizure Concordance Index: 99%  Medications  Pre Shock: Toradol 30 mg Brevital 120 mg succinylcholine 150 mg  Post Shock: Versed 4 mg  Seizure Duration: 50 seconds EMG 86 seconds EEG   Comments: Next treatment Friday  Lungs:  [x]   Clear to auscultation               []  Other:   Heart:    [x]   Regular rhythm             []  irregular rhythm    [x]   Previous H&P reviewed, patient examined and there are NO CHANGES                 []   Previous H&P reviewed, patient examined and there are changes noted.   Herbert RasmussenJohn Nanako Stopher, MD 1/9/201911:52 AM

## 2017-07-22 NOTE — Anesthesia Postprocedure Evaluation (Signed)
Anesthesia Post Note  Patient: Herbert Bryant  Procedure(s) Performed: ECT TX  Patient location during evaluation: PACU Anesthesia Type: General Level of consciousness: oriented and awake and alert Pain management: pain level controlled Vital Signs Assessment: post-procedure vital signs reviewed and stable Respiratory status: spontaneous breathing Cardiovascular status: blood pressure returned to baseline Anesthetic complications: no     Last Vitals:  Vitals:   07/22/17 1203 07/22/17 1205  BP: (!) 111/95   Pulse: (!) 128 (!) 120  Resp: 18   Temp:    SpO2: 95% 96%    Last Pain:  Vitals:   07/22/17 0909  TempSrc: Oral  PainSc: 0-No pain                 Kevin Space

## 2017-07-22 NOTE — ED Notes (Signed)
BEHAVIORAL HEALTH ROUNDING Patient sleeping: No. Patient alert and oriented: yes Behavior appropriate: Yes.  ; If no, describe:  Nutrition and fluids offered: yes Toileting and hygiene offered: Yes  Sitter present: q15 minute observations and security  monitoring Law enforcement present: Yes  ODS  

## 2017-07-22 NOTE — Anesthesia Preprocedure Evaluation (Addendum)
Anesthesia Evaluation  Patient identified by MRN, date of birth, ID band Patient awake    Reviewed: Allergy & Precautions, H&P , NPO status , Patient's Chart, lab work & pertinent test results  History of Anesthesia Complications Negative for: history of anesthetic complications  Airway Mallampati: III  TM Distance: <3 FB Neck ROM: full    Dental  (+) Chipped, Poor Dentition   Pulmonary neg shortness of breath, sleep apnea , former smoker,           Cardiovascular Exercise Tolerance: Good (-) angina(-) Past MI and (-) DOE negative cardio ROS       Neuro/Psych PSYCHIATRIC DISORDERS Anxiety Depression Bipolar Disorder negative neurological ROS     GI/Hepatic Neg liver ROS, GERD  Medicated and Controlled,  Endo/Other  negative endocrine ROS  Renal/GU negative Renal ROS  negative genitourinary   Musculoskeletal   Abdominal   Peds  Hematology negative hematology ROS (+)   Anesthesia Other Findings Past Medical History: No date: Acne No date: ADD (attention deficit disorder) No date: ADHD (attention deficit hyperactivity disorder) No date: Allergy No date: Anxiety No date: Bipolar 1 disorder (HCC) No date: Depression No date: GERD (gastroesophageal reflux disease) No date: IBS (irritable bowel syndrome)  Past Surgical History: 1993: MOUTH SURGERY     Comment:  skin graft No date: WISDOM TOOTH EXTRACTION  BMI    Body Mass Index:  46.20 kg/m      Reproductive/Obstetrics negative OB ROS                             Anesthesia Physical  Anesthesia Plan  ASA: III  Anesthesia Plan: General   Post-op Pain Management:    Induction: Intravenous  PONV Risk Score and Plan: TIVA  Airway Management Planned: Natural Airway and Mask  Additional Equipment:   Intra-op Plan:   Post-operative Plan:   Informed Consent: I have reviewed the patients History and Physical, chart, labs  and discussed the procedure including the risks, benefits and alternatives for the proposed anesthesia with the patient or authorized representative who has indicated his/her understanding and acceptance.   Dental Advisory Given  Plan Discussed with: Anesthesiologist, CRNA and Surgeon  Anesthesia Plan Comments: (Patient consented for risks of anesthesia including but not limited to:  - adverse reactions to medications - risk of intubation if required - damage to teeth, lips or other oral mucosa - sore throat or hoarseness - Damage to heart, brain, lungs or loss of life  Patient voiced understanding.)        Anesthesia Quick Evaluation  

## 2017-07-22 NOTE — Anesthesia Post-op Follow-up Note (Signed)
Anesthesia QCDR form completed.        

## 2017-07-22 NOTE — ED Provider Notes (Addendum)
[facility] Emergency Department Provider Note    First MD Initiated Contact with Patient 07/22/17 1254     (approximate)  I have reviewed the triage vital signs and the nursing notes.   HISTORY  Chief Complaint Aggressive Behavior    HPI Herbert Bryant is a 37 y.o. male presents from day surgery PACU after having ECT performed with acute agitated delirium.  Patient became very combative requiring four-point restraints for his safety and that of staff around him.  He required multiple doses of sedating medications reportedly received at least 12 mg of Versed 10 of Haldol 20 of Geodon in order to calm him enough for transfer down to the ER.  Patient brought him to the ER accompanied by Dr. Toni Amend of psychiatry.  Past Medical History:  Diagnosis Date  . Acne   . ADD (attention deficit disorder)   . ADHD (attention deficit hyperactivity disorder)   . Allergy   . Anxiety   . Bipolar 1 disorder (HCC)   . Depression   . GERD (gastroesophageal reflux disease)   . IBS (irritable bowel syndrome)    Family History  Problem Relation Age of Onset  . Arthritis Maternal Grandmother   . Stroke Maternal Grandmother   . Hypertension Maternal Grandmother   . Arthritis Maternal Grandfather   . Emphysema Paternal Grandfather   . Alcohol abuse Mother   . Depression Mother    Past Surgical History:  Procedure Laterality Date  . MOUTH SURGERY  1993   skin graft  . WISDOM TOOTH EXTRACTION     Patient Active Problem List   Diagnosis Date Noted  . Delirium of mixed origin 07/22/2017  . Acute cystitis with positive culture 01/05/2017  . 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 OSA (obstructive sleep apnea) 03/24/2014  . Morbid obesity (HCC) 12/14/2013  . Allergic rhinitis 12/14/2013      Prior to Admission medications   Medication Sig Start Date End Date Taking? Authorizing Provider  Acetylcysteine (NAC PO) Take by mouth.    [provider]    ALPRAZolam Prudy Feeler) 0.5 MG tablet Take 0.25-0.5 mg by mouth 3 (three) times daily as needed. Anxiety     [provider]  calcium citrate-vitamin D (CITRACAL+D) 315-200 MG-UNIT tablet Take 1 tablet by mouth 2 (two) times daily.    [provider]  cetirizine (ZYRTEC) 10 MG tablet Take 10 mg by mouth daily.    [provider]  finasteride (PROPECIA) 1 MG tablet One quarter tablet daily 01/16/17   [name], MD  fish oil-omega-3 fatty acids 1000 MG capsule Take 1 g by mouth 2 (two) times daily.      [provider]  LamoTRIgine 50 MG TBDP Take 200 mg by mouth daily.      [provider]  lithium carbonate (ESKALITH) 450 MG CR tablet TAKE 1 TABLET BY MOUTH EVERY EVENING X 7DAYS,THEN 2 TABLETS BY MOUTH EVERY EVENING 06/01/17   [provider]  Lurasidone HCl (LATUDA PO) Take by mouth.    [provider]  OLANZapine-FLUoxetine (SYMBYAX) 6-25 MG capsule 1 capsule at bedtime.  06/23/17   [provider]  ondansetron (ZOFRAN) 4 MG tablet Take 4 mg by mouth every 8 (eight) hours as needed for nausea or vomiting.    [provider]  oxymetazoline (AFRIN) 0.05 % nasal spray Place 1 spray into both nostrils 3 (three) times daily.    [provider]  Tasimelteon (HETLIOZ) 20 MG CAPS Take by  mouth at bedtime.     [provider]  Triamcinolone Acetonide (NASACORT AQ NA) Place into the nose.    [provider]  VYVANSE 70 MG capsule Take 70 mg by mouth daily. 04/25/17   [provider]    Allergies Patient has no known allergies.    Social History Social History   Tobacco Use  . Smoking status: Former Smoker    Packs/day: 0.50    Types: Cigarettes    Last attempt to quit: 06/25/2012    Years since quitting: 5.0  . Smokeless tobacco: Never Used  Substance Use Topics  . Alcohol use: No    Alcohol/week: 0.0 oz    Frequency: Never    Comment: occassional  . Drug use: Yes     Types: Marijuana    Comment: use on daily basics    Review of Systems Patient denies headaches, rhinorrhea, blurry vision, numbness, shortness of breath, chest pain, edema, cough, abdominal pain, nausea, vomiting, diarrhea, dysuria, fevers, rashes or hallucinations unless otherwise stated above in HPI. ____________________________________________   PHYSICAL EXAM:  VITAL SIGNS: Vitals:   07/22/17 1415 07/22/17 1445  BP: 103/65 108/65  Pulse: 96 86  Resp: 17 16  Temp:    SpO2: 99% 99%    Constitutional: Drowsy with some sonorous respirations but protecting his airway. Eyes: Conjunctivae are normal.  Head: Atraumatic. Nose: No congestion/rhinnorhea. Mouth/Throat: Mucous membranes are moist.   Neck: No stridor. Painless ROM.  Cardiovascular: Normal rate, regular rhythm. Grossly normal heart sounds.  Good peripheral circulation. Respiratory: Normal respiratory effort.  No retractions. Lungs CTAB. Gastrointestinal: Soft and nontender. No distention. No abdominal bruits. No CVA tenderness. Musculoskeletal: No lower extremity tenderness nor edema.  No joint effusions. Neurologic:  MAE spontaneously Skin:  Skin is warm, dry and intact. No rash noted. Psychiatric: unable to assess ____________________________________________   LABS (all labs ordered are listed, but only abnormal results are displayed)  Results for orders placed or performed during the hospital encounter of 07/22/17 (from the past 24 hour(s))  CBC with Differential/Platelet     Status: Abnormal   Collection Time: 07/22/17  1:35 PM  Result Value Ref Range   WBC 8.4 3.8 - 10.6 K/uL   RBC 4.50 4.40 - 5.90 MIL/uL   Hemoglobin 13.1 13.0 - 18.0 g/dL   HCT 16.139.5 (L) 09.640.0 - 04.552.0 %   MCV 87.7 80.0 - 100.0 fL   MCH 29.0 26.0 - 34.0 pg   MCHC 33.1 32.0 - 36.0 g/dL   RDW 40.914.5 81.111.5 - 91.414.5 %   Platelets 164 150 - 440 K/uL   Neutrophils Relative % 79 %   Neutro Abs 6.7 (H) 1.4 - 6.5 K/uL   Lymphocytes Relative 13 %    Lymphs Abs 1.1 1.0 - 3.6 K/uL   Monocytes Relative 6 %   Monocytes Absolute 0.5 0.2 - 1.0 K/uL   Eosinophils Relative 2 %   Eosinophils Absolute 0.1 0 - 0.7 K/uL   Basophils Relative 0 %   Basophils Absolute 0.0 0 - 0.1 K/uL  Basic metabolic panel     Status: Abnormal   Collection Time: 07/22/17  1:35 PM  Result Value Ref Range   Sodium 140 135 - 145 mmol/L   Potassium 3.4 (L) 3.5 - 5.1 mmol/L   Chloride 107 101 - 111 mmol/L   CO2 22 22 - 32 mmol/L   Glucose, Bld 103 (H) 65 - 99 mg/dL   BUN 11 6 - 20 mg/dL   Creatinine, Ser  0.83 0.61 - 1.24 mg/dL   Calcium 7.9 (L) 8.9 - 10.3 mg/dL   GFR calc non Af Amer >60 >60 mL/min   GFR calc Af Amer >60 >60 mL/min   Anion gap 11 5 - 15   ____________________________________________  EKG My review and personal interpretation at Time: 13:18   Indication: med eval  Rate: 80  Rhythm: sinus Axis: normal  Other: normal intervals, no stemi, no brugada or wpw ____________________________________________  RADIOLOGY   ____________________________________________   PROCEDURES  Procedure(s) performed:  .Critical Care Performed by: Willy Eddy, MD Authorized by: Willy Eddy, MD   Critical care provider statement:    Critical care time (minutes):  30   Critical care time was exclusive of:  Separately billable procedures and treating other patients   Critical care was necessary to treat or prevent imminent or life-threatening deterioration of the following conditions:  Respiratory failure and CNS failure or compromise (respiratory depression in the setting of chemical calming agents for acute agitation)   Critical care was time spent personally by me on the following activities:  Development of treatment plan with patient or surrogate, discussions with consultants, evaluation of patient's response to treatment, examination of patient, obtaining history from patient or surrogate, ordering and performing treatments and interventions,  ordering and review of laboratory studies, ordering and review of radiographic studies, pulse oximetry, re-evaluation of patient's condition and review of old charts      Critical Care performed: yes ____________________________________________   INITIAL IMPRESSION / ASSESSMENT AND PLAN / ED COURSE  Pertinent labs & imaging results that were available during my care of the patient were reviewed by me and considered in my medical decision making (see chart for details).  DDX: Dehydration, sepsis, pna, uti, hypoglycemia, cva, drug effect, withdrawal, psychosis   Herbert Bryant is a 37 y.o. who presents to the ED with acute agitation after having ECT performed.  Patient received significant amount of calming agents.  Patient arrives drowsy but protecting his airway.  Patient monitored in the ER with supplemental oxygen.  Blood work is reassuring.  She will require monitoring and observation in the ER to ensure improvement in encephalopathy.  Have low suspicion for infectious process at this time.  Was made IVC for his own safety due to significant agitation requiring significant amounts of chemical calming agents.  Clinical Course as of Jul 22 1616  Wed Jul 22, 2017  1534 Patient's agitation has now resolved.  He is pleasant calm and cooperative.  Most likely had some form of emergence reaction after anesthesia for ECT.  Patient has been evaluated by Dr. Toni Amend at bedside.  At this point do believe patient is stable and appropriate for discharge with family.  Does not meet any criteria for IVC at this time.  [PR]    Clinical Course User Index [PR] Willy Eddy, MD     ____________________________________________   FINAL CLINICAL IMPRESSION(S) / ED DIAGNOSES  Final diagnoses:  Agitation      NEW MEDICATIONS STARTED DURING THIS VISIT:  This SmartLink is deprecated. Use AVSMEDLIST instead to display the medication list for a patient.   Note:  This document was prepared  using Dragon voice recognition software and may include unintentional dictation errors.    Willy Eddy, MD 07/22/17 1456    Willy Eddy, MD 07/22/17 [phone]

## 2017-07-22 NOTE — Discharge Instructions (Addendum)
[place] with PCP.  Return immediately for any concerns or questions.

## 2017-07-22 NOTE — Anesthesia Procedure Notes (Signed)
Date/Time: 07/22/2017 11:48 AM Performed by: Lily KocherPeralta, Rakiya Krawczyk, CRNA Pre-anesthesia Checklist: Patient identified, Emergency Drugs available, Suction available and Patient being monitored Patient Re-evaluated:Patient Re-evaluated prior to induction Oxygen Delivery Method: Circle system utilized Preoxygenation: Pre-oxygenation with 100% oxygen Induction Type: IV induction Ventilation: Mask ventilation without difficulty and Mask ventilation throughout procedure Airway Equipment and Method: Bite block Placement Confirmation: positive ETCO2 Dental Injury: Teeth and Oropharynx as per pre-operative assessment

## 2017-07-22 NOTE — ED Triage Notes (Signed)
Pt presents to ED from PACU where patient was seen earlier today for ECT. Pt presented to ED in restraints, restraints D/C by EDP and order for 1:1 sitter obtained. Per ECT RN pt became combative after receiving ECT. Pt received the following medications: Premedications: 30mg  Toradol Intra-procedure meds: 120mg  Brevital, 150 Succs Post-procedure meds: 8mg  Ativan, 10mg  Haldol, 12mg  Versed, 20mg  Geodon ECT RN reports this is patient's 2nd ECT procedure for depression. Pt presents to ED lethargic and awakens to painful stimuli.

## 2017-07-22 NOTE — Consult Note (Signed)
[facility] Consult   Reason for Consult: Consult for 37 year old man who was brought down to the emergency room after becoming agitated and delirious from ECT today Referring Physician: Quentin Cornwall Patient Identification: Herbert Bryant MRN:  [id number] Principal Diagnosis: Delirium of mixed origin Diagnosis:   Patient Active Problem List   Diagnosis Date Noted  . Delirium of mixed origin [F05] 07/22/2017  . Acute cystitis with positive culture [N30.00] 01/05/2017  . Male pattern baldness [L64.9] 01/11/2015  . OSA (obstructive sleep apnea) [G47.33] 03/24/2014  . Morbid obesity ([place]) [E66.01] 12/14/2013  . Allergic rhinitis [J30.9] 12/14/2013    Total Time spent with patient: 45 minutes  Subjective:   Herbert Bryant is a 37 y.o. male patient admitted with "I do not know.  HPI: Patient seen and interviewed chart reviewed.  This is a 37 year old man who is currently receiving outpatient ECT treatment.  He had come for his ECT treatment this morning and received a bilateral treatment over seen by me.  No complications during the treatment itself but in recovery the patient became agitated.  His agitation escalated despite the use of normal and appropriate medications to the point that we were required to restrain him first with hands and then with soft bed restraints and administer multiple doses of medications for his own safety.  Eventually the patient did calm down and go to sleep and was brought down to the emergency room for monitoring of his oxygen and vitals and behavior while he recovered.  Patient has now woken up.  He does not have a memory of the agitation.  He does not have any specific complaints currently.  Past Psychiatric History: Patient has a long history of chronic severe depression with passive suicidal ideation.  Currently he was beginning a course of ECT treatment.  This was his second treatment today.  Risk to Self: Is patient at risk for suicide?:  No Risk to Others:   Prior Inpatient Therapy:   Prior Outpatient Therapy:    Past Medical History:  Past Medical History:  Diagnosis Date  . Acne   . ADD (attention deficit disorder)   . ADHD (attention deficit hyperactivity disorder)   . Allergy   . Anxiety   . Bipolar 1 disorder ([place])   . Depression   . GERD (gastroesophageal reflux disease)   . IBS (irritable bowel syndrome)     Past Surgical History:  Procedure Laterality Date  . MOUTH SURGERY  1993   skin graft  . WISDOM TOOTH EXTRACTION     Family History:  Family History  Problem Relation Age of Onset  . Arthritis Maternal Grandmother   . Stroke Maternal Grandmother   . Hypertension Maternal Grandmother   . Arthritis Maternal Grandfather   . Emphysema Paternal Grandfather   . Alcohol abuse Mother   . Depression Mother    Family Psychiatric  History: None Social History:  Social History   Substance and Sexual Activity  Alcohol Use No  . Alcohol/week: 0.0 oz  . Frequency: Never   Comment: occassional     Social History   Substance and Sexual Activity  Drug Use Yes  . Types: Marijuana   Comment: use on daily basics    Social History   Socioeconomic History  . Marital status: Single    Spouse name: None  . Number of children: 0  . Years of education: None  . Highest education level: Bachelor's degree (e.g., BA, AB, BS)  Social Needs  . Emergency planning/management officer  strain: Not hard at all  . Food insecurity - worry: Never true  . Food insecurity - inability: Never true  . Transportation needs - medical: No  . Transportation needs - non-medical: No  Occupational History  . None  Tobacco Use  . Smoking status: Former Smoker    Packs/day: 0.50    Types: Cigarettes    Last attempt to quit: 06/25/2012    Years since quitting: 5.0  . Smokeless tobacco: Never Used  Substance and Sexual Activity  . Alcohol use: No    Alcohol/week: 0.0 oz    Frequency: Never    Comment: occassional  . Drug use: Yes     Types: Marijuana    Comment: use on daily basics  . Sexual activity: Yes    Birth control/protection: None  Other Topics Concern  . None  Social History Narrative  . None   Additional Social History:    Allergies:  No Known Allergies  Labs:  Results for orders placed or performed during the hospital encounter of 07/22/17 (from the past 48 hour(s))  CBC with Differential/Platelet     Status: Abnormal   Collection Time: 07/22/17  1:35 PM  Result Value Ref Range   WBC 8.4 3.8 - 10.6 K/uL   RBC 4.50 4.40 - 5.90 MIL/uL   Hemoglobin 13.1 13.0 - 18.0 g/dL   HCT 39.5 (L) 40.0 - 52.0 %   MCV 87.7 80.0 - 100.0 fL   MCH 29.0 26.0 - 34.0 pg   MCHC 33.1 32.0 - 36.0 g/dL   RDW 14.5 11.5 - 14.5 %   Platelets 164 150 - 440 K/uL   Neutrophils Relative % 79 %   Neutro Abs 6.7 (H) 1.4 - 6.5 K/uL   Lymphocytes Relative 13 %   Lymphs Abs 1.1 1.0 - 3.6 K/uL   Monocytes Relative 6 %   Monocytes Absolute 0.5 0.2 - 1.0 K/uL   Eosinophils Relative 2 %   Eosinophils Absolute 0.1 0 - 0.7 K/uL   Basophils Relative 0 %   Basophils Absolute 0.0 0 - 0.1 K/uL    Comment: Performed at [facility], [address] [place], [place] [address]  Basic metabolic panel     Status: Abnormal   Collection Time: 07/22/17  1:35 PM  Result Value Ref Range   Sodium 140 135 - 145 mmol/L   Potassium 3.4 (L) 3.5 - 5.1 mmol/L   Chloride 107 101 - 111 mmol/L   CO2 22 22 - 32 mmol/L   Glucose, Bld 103 (H) 65 - 99 mg/dL   BUN 11 6 - 20 mg/dL   Creatinine, Ser 0.83 0.61 - 1.24 mg/dL   Calcium 7.9 (L) 8.9 - 10.3 mg/dL   GFR calc non Af Amer >60 >60 mL/min   GFR calc Af Amer >60 >60 mL/min    Comment: (NOTE) The eGFR has been calculated using the CKD EPI equation. This calculation has not been validated in all clinical situations. eGFR's persistently <60 mL/min signify possible Chronic Kidney Disease.    Anion gap 11 5 - 15    Comment: Performed at [facility], [address]  [place], [place] [address]    No current facility-administered medications for this encounter.    Current Outpatient Medications  Medication Sig Dispense Refill  . Acetylcysteine (NAC PO) Take by mouth.    . ALPRAZolam (XANAX) 0.5 MG tablet Take 0.25-0.5 mg by mouth 3 (three) times daily as needed. Anxiety     . calcium  citrate-vitamin D (CITRACAL+D) 315-200 MG-UNIT tablet Take 1 tablet by mouth 2 (two) times daily.    . cetirizine (ZYRTEC) 10 MG tablet Take 10 mg by mouth daily.    . finasteride (PROPECIA) 1 MG tablet One quarter tablet daily 30 tablet 5  . fish oil-omega-3 fatty acids 1000 MG capsule Take 1 g by mouth 2 (two) times daily.      . LamoTRIgine 50 MG TBDP Take 200 mg by mouth daily.      Marland Kitchen lithium carbonate (ESKALITH) 450 MG CR tablet TAKE 1 TABLET BY MOUTH EVERY EVENING X 7DAYS,THEN 2 TABLETS BY MOUTH EVERY EVENING  2  . Lurasidone HCl (LATUDA PO) Take by mouth.    . OLANZapine-FLUoxetine (SYMBYAX) 6-25 MG capsule 1 capsule at bedtime.   4  . ondansetron (ZOFRAN) 4 MG tablet Take 4 mg by mouth every 8 (eight) hours as needed for nausea or vomiting.    Marland Kitchen oxymetazoline (AFRIN) 0.05 % nasal spray Place 1 spray into both nostrils 3 (three) times daily.    . Tasimelteon (HETLIOZ) 20 MG CAPS Take by mouth at bedtime.     . Triamcinolone Acetonide (NASACORT AQ NA) Place into the nose.    Marland Kitchen VYVANSE 70 MG capsule Take 70 mg by mouth daily.  0   Facility-Administered Medications Ordered in Other Encounters  Medication Dose Route Frequency Provider Last Rate Last Dose  . fentaNYL (SUBLIMAZE) injection 25 mcg  25 mcg Intravenous Q5 min PRN Alvin Critchley, MD      . haloperidol lactate (HALDOL) injection 5 mg  5 mg Intravenous Q6H PRN Jasmarie Coppock T, MD      . ketorolac (TORADOL) 30 MG/ML injection           . LORazepam (ATIVAN) injection 4 mg  4 mg Intravenous Once Daelynn Blower T, MD      . midazolam (VERSED) injection 4 mg  4 mg Intravenous Once Taneika Choi T, MD      . ondansetron  [facility]) injection 4 mg  4 mg Intravenous Once PRN Alvin Critchley, MD      . sodium chloride flush 0.9 % injection           . sodium chloride flush 0.9 % injection             Musculoskeletal: Strength & Muscle Tone: within normal limits Gait & Station: unsteady Patient leans: N/A  Psychiatric Specialty Exam: Physical Exam  Nursing note and vitals reviewed. Constitutional: He appears well-developed and well-nourished.  HENT:  Head: Normocephalic and atraumatic.  Eyes: Conjunctivae are normal. Pupils are equal, round, and reactive to light.  Neck: Normal range of motion.  Cardiovascular: Normal heart sounds.  Respiratory: Effort normal.  GI: Soft.  Musculoskeletal: Normal range of motion.  Neurological: He is alert.  Skin: Skin is warm and dry.     Psychiatric: Judgment normal. His affect is blunt. His speech is delayed. He is slowed. Thought content is not paranoid. He expresses no homicidal and no suicidal ideation. He exhibits abnormal recent memory.    Review of Systems  Constitutional: Negative.   HENT: Negative.   Eyes: Negative.   Respiratory: Negative.   Cardiovascular: Negative.   Gastrointestinal: Negative.   Musculoskeletal: Negative.   Skin: Negative.   Neurological: Negative.   Psychiatric/Behavioral: Positive for memory loss.    Blood pressure 108/65, pulse 86, temperature 97.8 F (36.6 C), temperature source Axillary, resp. rate 16, height _0  (1.778 m), weight (!) 146.1 kg (322 lb), SpO2  99 %.Body mass index is 46.2 kg/m.  General Appearance: Disheveled  Eye Contact:  Fair  Speech:  Slow  Volume:  Decreased  Mood:  Euthymic  Affect:  Constricted  Thought Process:  Goal Directed  Orientation:  Full (Time, Place, and Person)  Thought Content:  Logical  Suicidal Thoughts:  No  Homicidal Thoughts:  No  Memory:  Immediate;   Fair Recent;   Poor Remote;   Fair  Judgement:  Fair  Insight:  Shallow  Psychomotor Activity:  Decreased  Concentration:   Concentration: Fair  Recall:  [employer] of Knowledge:  Fair  Language:  Fair  Akathisia:  No  Handed:  Right  AIMS (if indicated):     Assets:  Agricultural consultant Housing Social Support  ADL's:  Impaired  Cognition:  Impaired,  Mild  Sleep:        Treatment Plan Summary: Plan 37 year old man with depression who became extremely agitated and his postictal delirium.  Patient required transient sedation and restraint and has been monitored in the emergency room for the past few hours.  He is now awake and alert.  Has no memory of the agitation.  Not currently agitated or aggressive.  Affect euthymic.  I spoke with the patient and told him that given the circumstances I was not comfortable administering ECT on Friday.  I am uncertain whether it would be worthwhile to do any further ECT given this sort of incident.  I have asked the patient to continue his usual outpatient medications and to contact me next week if he after thinking about it feels that he would like to discuss continuing ECT treatment.  ECT nursing team aware.  Situation evaluated with the emergency room doctor.  Patient is not under IVC and can be released from the emergency room at their discretion.  I spoke with the patient's father this morning as well and he understands the situation also.  Disposition: No evidence of imminent risk to self or others at present.   Supportive therapy provided about ongoing stressors. Discussed crisis plan, support from social network, calling 911, coming to the Emergency Department, and calling Suicide Hotline.  Alethia Berthold, MD 07/22/2017 3:34 PM

## 2017-07-22 NOTE — H&P (Signed)
Herbert GuernseyMichael O Bryant is an 37 y.o. male.   Chief Complaint: Patient continues to have depression passive suicidal ideations and low mood HPI: History of recurrent severe depression unresponsive to medicine  Past Medical History:  Diagnosis Date  . Acne   . ADD (attention deficit disorder)   . ADHD (attention deficit hyperactivity disorder)   . Allergy   . Anxiety   . Bipolar 1 disorder (HCC)   . Depression   . GERD (gastroesophageal reflux disease)   . IBS (irritable bowel syndrome)     Past Surgical History:  Procedure Laterality Date  . MOUTH SURGERY  1993   skin graft  . WISDOM TOOTH EXTRACTION      Family History  Problem Relation Age of Onset  . Arthritis Maternal Grandmother   . Stroke Maternal Grandmother   . Hypertension Maternal Grandmother   . Arthritis Maternal Grandfather   . Emphysema Paternal Grandfather   . Alcohol abuse Mother   . Depression Mother    Social History:  reports that he quit smoking about 5 years ago. His smoking use included cigarettes. He smoked 0.50 packs per day. he has never used smokeless tobacco. He reports that he uses drugs. Drug: Marijuana. He reports that he does not drink alcohol.  Allergies: No Known Allergies   (Not in a hospital admission)  No results found for this or any previous visit (from the past 48 hour(s)). No results found.  Review of Systems  Constitutional: Negative.   HENT: Negative.   Eyes: Negative.   Respiratory: Negative.   Cardiovascular: Negative.   Gastrointestinal: Negative.   Musculoskeletal: Negative.   Skin: Negative.   Neurological: Negative.   Psychiatric/Behavioral: Positive for depression.    Blood pressure (!) 156/85, pulse 70, temperature (!) 97.4 F (36.3 C), temperature source Oral, resp. rate 20, height 5\' 10"  (1.778 m), weight (!) 148.8 kg (328 lb), SpO2 96 %. Physical Exam  Nursing note and vitals reviewed. Constitutional: He appears well-developed and well-nourished.  HENT:  Head:  Normocephalic and atraumatic.  Eyes: Conjunctivae are normal. Pupils are equal, round, and reactive to light.  Neck: Normal range of motion.  Cardiovascular: Regular rhythm and normal heart sounds.  Respiratory: Effort normal. No respiratory distress.  GI: Soft.  Musculoskeletal: Normal range of motion.  Neurological: He is alert.  Skin: Skin is warm and dry.  Psychiatric: He has a normal mood and affect. His behavior is normal. Judgment and thought content normal.     Assessment/Plan Starting ECT with today being the second treatment  Herbert RasmussenJohn Clapacs, MD 07/22/2017, 11:45 AM

## 2017-07-22 NOTE — ED Notes (Signed)
BEHAVIORAL HEALTH ROUNDING Patient sleeping: Yes.   Patient alert and oriented: eyes closed  Appears asleep Behavior appropriate: Yes.  ; If no, describe:  Nutrition and fluids offered: Yes  Toileting and hygiene offered: sleeping Sitter present: q 15 minute observations and security camera monitoring Law enforcement present: yes  ODS 

## 2017-07-22 NOTE — ED Notes (Signed)

## 2017-07-22 NOTE — Transfer of Care (Signed)
Immediate Anesthesia Transfer of Care Note  Patient: Herbert GuernseyMichael O Bryant  Procedure(s) Performed: ECT TX  Patient Location: PACU  Anesthesia Type:General  Level of Consciousness: sedated and confused  Airway & Oxygen Therapy: Patient Spontanous Breathing and Patient connected to face mask oxygen  Post-op Assessment: Report given to RN and Post -op Vital signs reviewed and stable  Post vital signs: Reviewed and stable  Last Vitals:  Vitals:   07/22/17 0909 07/22/17 1203  BP: (!) 156/85 (!) 111/95  Pulse: 70 (!) 128  Resp: 20 18  Temp: (!) 36.3 C   SpO2: 96% 95%    Last Pain:  Vitals:   07/22/17 0909  TempSrc: Oral  PainSc: 0-No pain         Complications: No apparent anesthesia complications

## 2017-07-24 ENCOUNTER — Telehealth: Payer: Self-pay

## 2017-07-27 ENCOUNTER — Encounter: Payer: Self-pay | Admitting: Anesthesiology

## 2017-07-27 ENCOUNTER — Other Ambulatory Visit: Payer: Self-pay | Admitting: Psychiatry

## 2017-07-27 ENCOUNTER — Ambulatory Visit: Payer: Self-pay | Admitting: Anesthesiology

## 2017-07-27 ENCOUNTER — Encounter (HOSPITAL_BASED_OUTPATIENT_CLINIC_OR_DEPARTMENT_OTHER)
Admission: RE | Admit: 2017-07-27 | Discharge: 2017-07-27 | Disposition: A | Discharge status: Home / Self Care | Payer: BLUE CROSS/BLUE SHIELD | Source: Ambulatory Visit | Attending: Psychiatry | Admitting: Psychiatry

## 2017-07-27 DIAGNOSIS — F332 Major depressive disorder, recurrent severe without psychotic features: Secondary | ICD-10-CM

## 2017-07-27 DIAGNOSIS — G4733 Obstructive sleep apnea (adult) (pediatric): Secondary | ICD-10-CM | POA: Diagnosis not present

## 2017-07-27 DIAGNOSIS — K219 Gastro-esophageal reflux disease without esophagitis: Secondary | ICD-10-CM | POA: Diagnosis not present

## 2017-07-27 DIAGNOSIS — F329 Major depressive disorder, single episode, unspecified: Secondary | ICD-10-CM

## 2017-07-27 DIAGNOSIS — K589 Irritable bowel syndrome without diarrhea: Secondary | ICD-10-CM | POA: Diagnosis not present

## 2017-07-27 DIAGNOSIS — R45851 Suicidal ideations: Secondary | ICD-10-CM | POA: Diagnosis not present

## 2017-07-27 DIAGNOSIS — F419 Anxiety disorder, unspecified: Secondary | ICD-10-CM | POA: Diagnosis not present

## 2017-07-27 DIAGNOSIS — Z87891 Personal history of nicotine dependence: Secondary | ICD-10-CM | POA: Insufficient documentation

## 2017-07-27 DIAGNOSIS — F338 Other recurrent depressive disorders: Secondary | ICD-10-CM | POA: Diagnosis not present

## 2017-07-27 DIAGNOSIS — F909 Attention-deficit hyperactivity disorder, unspecified type: Secondary | ICD-10-CM

## 2017-07-27 DIAGNOSIS — F319 Bipolar disorder, unspecified: Secondary | ICD-10-CM | POA: Diagnosis not present

## 2017-07-27 MED ORDER — MIDAZOLAM HCL 2 MG/2ML IJ SOLN
INTRAMUSCULAR | Status: AC
  Filled 2017-07-27: qty 4

## 2017-07-27 MED ORDER — HALOPERIDOL LACTATE 5 MG/ML IJ SOLN
INTRAMUSCULAR | Status: AC
  Filled 2017-07-27: qty 1

## 2017-07-27 MED ORDER — ZIPRASIDONE MESYLATE 20 MG IM SOLR
20.0000 mg | Freq: Once | INTRAMUSCULAR | Status: AC
  Administered 2017-07-27: 20 mg via INTRAMUSCULAR
  Filled 2017-07-27 (×2): qty 20

## 2017-07-27 MED ORDER — MIDAZOLAM HCL 5 MG/5ML IJ SOLN
INTRAMUSCULAR | Status: AC
  Filled 2017-07-27: qty 5

## 2017-07-27 MED ORDER — SODIUM CHLORIDE 0.9 % IV SOLN
500.0000 mL | Freq: Once | INTRAVENOUS | Status: AC
  Administered 2017-07-27: 500 mL via INTRAVENOUS

## 2017-07-27 MED ORDER — HALOPERIDOL LACTATE 5 MG/ML IJ SOLN
5.0000 mg | Freq: Once | INTRAMUSCULAR | Status: AC
  Administered 2017-07-27: 5 mg via INTRAVENOUS
  Filled 2017-07-27: qty 1

## 2017-07-27 MED ORDER — SODIUM CHLORIDE 0.9 % IV SOLN
INTRAVENOUS | Status: DC | PRN
  Administered 2017-07-27: 11:00:00 via INTRAVENOUS

## 2017-07-27 MED ORDER — SUCCINYLCHOLINE CHLORIDE 20 MG/ML IJ SOLN
INTRAMUSCULAR | Status: DC | PRN
  Administered 2017-07-27: 150 mg via INTRAVENOUS

## 2017-07-27 MED ORDER — MIDAZOLAM HCL 2 MG/2ML IJ SOLN: 8.0000 mg | Freq: Once | INTRAMUSCULAR | Status: DC

## 2017-07-27 MED ORDER — METHOHEXITAL SODIUM 100 MG/10ML IV SOSY
PREFILLED_SYRINGE | INTRAVENOUS | Status: DC | PRN
  Administered 2017-07-27: 120 mg via INTRAVENOUS

## 2017-07-27 MED ORDER — ONDANSETRON HCL 4 MG/2ML IJ SOLN
4.0000 mg | Freq: Once | INTRAMUSCULAR | Status: DC | PRN
Start: 2017-07-27 — End: 2017-07-28

## 2017-07-27 MED ORDER — MIDAZOLAM HCL 5 MG/5ML IJ SOLN
INTRAMUSCULAR | Status: DC | PRN
  Administered 2017-07-27: 8 mg via INTRAVENOUS

## 2017-07-27 MED ORDER — KETOROLAC TROMETHAMINE 30 MG/ML IJ SOLN
INTRAMUSCULAR | Status: AC
  Administered 2017-07-27: 30 mg via INTRAVENOUS
  Filled 2017-07-27: qty 1

## 2017-07-27 MED ORDER — KETOROLAC TROMETHAMINE 30 MG/ML IJ SOLN
30.0000 mg | Freq: Once | INTRAMUSCULAR | Status: AC
  Administered 2017-07-27: 30 mg via INTRAVENOUS

## 2017-07-27 NOTE — Anesthesia Preprocedure Evaluation (Signed)
Anesthesia Evaluation  Patient identified by MRN, date of birth, ID band Patient awake    Reviewed: Allergy & Precautions, H&P , NPO status , Patient's Chart, lab work & pertinent test results  History of Anesthesia Complications Negative for: history of anesthetic complications  Airway Mallampati: III  TM Distance: <3 FB Neck ROM: full    Dental  (+) Chipped, Poor Dentition   Pulmonary neg shortness of breath, sleep apnea , former smoker,           Cardiovascular Exercise Tolerance: Good (-) angina(-) Past MI and (-) DOE negative cardio ROS       Neuro/Psych PSYCHIATRIC DISORDERS Anxiety Depression Bipolar Disorder negative neurological ROS     GI/Hepatic Neg liver ROS, GERD  Medicated and Controlled,  Endo/Other  negative endocrine ROS  Renal/GU negative Renal ROS  negative genitourinary   Musculoskeletal   Abdominal   Peds  Hematology negative hematology ROS (+)   Anesthesia Other Findings Past Medical History: No date: Acne No date: ADD (attention deficit disorder) No date: ADHD (attention deficit hyperactivity disorder) No date: Allergy No date: Anxiety No date: Bipolar 1 disorder (HCC) No date: Depression No date: GERD (gastroesophageal reflux disease) No date: IBS (irritable bowel syndrome)  Past Surgical History: 1993: MOUTH SURGERY     Comment:  skin graft No date: WISDOM TOOTH EXTRACTION  BMI    Body Mass Index:  46.20 kg/m      Reproductive/Obstetrics negative OB ROS                             Anesthesia Physical  Anesthesia Plan  ASA: III  Anesthesia Plan: General   Post-op Pain Management:    Induction: Intravenous  PONV Risk Score and Plan: TIVA  Airway Management Planned: Natural Airway and Mask  Additional Equipment:   Intra-op Plan:   Post-operative Plan:   Informed Consent: I have reviewed the patients History and Physical, chart, labs  and discussed the procedure including the risks, benefits and alternatives for the proposed anesthesia with the patient or authorized representative who has indicated his/her understanding and acceptance.   Dental Advisory Given  Plan Discussed with: Anesthesiologist, CRNA and Surgeon  Anesthesia Plan Comments: (Patient consented for risks of anesthesia including but not limited to:  - adverse reactions to medications - risk of intubation if required - damage to teeth, lips or other oral mucosa - sore throat or hoarseness - Damage to heart, brain, lungs or loss of life  Patient voiced understanding.)        Anesthesia Quick Evaluation  

## 2017-07-27 NOTE — Anesthesia Post-op Follow-up Note (Signed)
Anesthesia QCDR form completed.        

## 2017-07-27 NOTE — H&P (Signed)
Herbert Bryant is an 37 y.o. male.   Chief Complaint: Continues to feel depressed with suicidal ideation but no acute plan HPI: History of long-standing chronic depression without much improvement  Past Medical History:  Diagnosis Date  . Acne   . ADD (attention deficit disorder)   . ADHD (attention deficit hyperactivity disorder)   . Allergy   . Anxiety   . Bipolar 1 disorder (HCC)   . Depression   . GERD (gastroesophageal reflux disease)   . IBS (irritable bowel syndrome)     Past Surgical History:  Procedure Laterality Date  . MOUTH SURGERY  1993   skin graft  . WISDOM TOOTH EXTRACTION      Family History  Problem Relation Age of Onset  . Arthritis Maternal Grandmother   . Stroke Maternal Grandmother   . Hypertension Maternal Grandmother   . Arthritis Maternal Grandfather   . Emphysema Paternal Grandfather   . Alcohol abuse Mother   . Depression Mother    Social History:  reports that he quit smoking about 5 years ago. His smoking use included cigarettes. He smoked 0.50 packs per day. he has never used smokeless tobacco. He reports that he uses drugs. Drug: Marijuana. He reports that he does not drink alcohol.  Allergies: No Known Allergies   (Not in a hospital admission)  No results found for this or any previous visit (from the past 48 hour(s)). No results found.  Review of Systems  Constitutional: Negative.   HENT: Negative.   Eyes: Negative.   Respiratory: Negative.   Cardiovascular: Negative.   Gastrointestinal: Negative.   Musculoskeletal: Negative.   Skin: Negative.   Neurological: Negative.   Psychiatric/Behavioral: Positive for depression and suicidal ideas. Negative for hallucinations, memory loss and substance abuse. The patient is nervous/anxious. The patient does not have insomnia.     Blood pressure 133/80, pulse 78, temperature 97.8 F (36.6 C), temperature source Oral, resp. rate 16, height 5\' 10"  (1.778 m), weight (!) 143.8 kg (317 lb),  SpO2 98 %. Physical Exam  Nursing note and vitals reviewed. Constitutional: He appears well-developed and well-nourished.  HENT:  Head: Normocephalic and atraumatic.  Eyes: Conjunctivae are normal. Pupils are equal, round, and reactive to light.  Neck: Normal range of motion.  Cardiovascular: Regular rhythm and normal heart sounds.  Respiratory: Effort normal.  GI: Soft.  Musculoskeletal: Normal range of motion.  Neurological: He is alert.  Skin: Skin is warm and dry.  Psychiatric: His speech is normal and behavior is normal. Judgment normal. His mood appears anxious. Cognition and memory are normal. He expresses suicidal ideation. He expresses no suicidal plans.     Assessment/Plan Treatment today and plan for 3 times a week treatment going forward if possible  [name], MD 07/27/2017, 11:05 AM

## 2017-07-27 NOTE — Transfer of Care (Signed)
Immediate Anesthesia Transfer of Care Note  Patient: Herbert Bryant  Procedure(s) Performed: ECT TX  Patient Location: PACU  Anesthesia Type:General  Level of Consciousness: sedated  Airway & Oxygen Therapy: Patient Spontanous Breathing and Patient connected to face mask oxygen  Post-op Assessment: Report given to RN and Post -op Vital signs reviewed and stable  Post vital signs: Reviewed  Last Vitals:  Vitals:   07/27/17 0843 07/27/17 1125  BP: 133/80 (!) 158/83  Pulse:  93  Resp:  18  Temp:  36.9 C  SpO2:  96%    Last Pain:  Vitals:   07/27/17 0825  TempSrc: Oral         Complications: No apparent anesthesia complications

## 2017-07-27 NOTE — Procedures (Signed)
ECT SERVICES Physician's Interval Evaluation & Treatment Note  Patient Identification: Herbert GuernseyMichael O Bryant MRN:  [id number] Date of Evaluation:  07/27/2017 TX #: 3  MADRS:   MMSE:   P.E. Findings:  No change to physical exam  Psychiatric Interval Note:  Continues to report depressed mood with passive suicidal ideation  Subjective:  Patient is a 37 y.o. male seen for evaluation for Electroconvulsive Therapy. No specific new complaint  Treatment Summary:   []   Right Unilateral             [x]  Bilateral   % Energy : 1.0 ms 25%   Impedance: 1260 ohms  Seizure Energy Index: 25,226 V squared  Postictal Suppression Index: 66%  Seizure Concordance Index: 98%  Medications  Pre Shock: Robinul 0.1 mg Toradol 30 mg Brevital 120 mg succinylcholine 150 mg  Post Shock: Versed 8 mg, Haldol 5 mg, Geodon 20 mg IM  Seizure Duration: 40 seconds by EMG 79 seconds by EEG   Comments: If all goes well we will continue following up 3 times a week treatment  Lungs:  [x]   Clear to auscultation               []  Other:   Heart:    [x]   Regular rhythm             []  irregular rhythm    [x]   Previous H&P reviewed, patient examined and there are NO CHANGES                 []   Previous H&P reviewed, patient examined and there are changes noted.   Herbert RasmussenJohn Roxanne Panek, MD 1/14/201911:06 AM

## 2017-07-27 NOTE — Anesthesia Postprocedure Evaluation (Signed)
Anesthesia Post Note  Patient: Herbert Bryant  Procedure(s) Performed: ECT TX  Patient location during evaluation: PACU Anesthesia Type: General Level of consciousness: awake and alert Pain management: pain level controlled Vital Signs Assessment: post-procedure vital signs reviewed and stable Respiratory status: spontaneous breathing and respiratory function stable Cardiovascular status: stable Anesthetic complications: no     Last Vitals:  Vitals:   07/27/17 0843 07/27/17 1125  BP: 133/80 (!) 158/83  Pulse:  93  Resp:  18  Temp:  36.9 C  SpO2:  96%    Last Pain:  Vitals:   07/27/17 0825  TempSrc: Oral                 Rolan Wrightsman K

## 2017-07-27 NOTE — Discharge Instructions (Signed)
1)  The drugs that you have been given will stay in your system until tomorrow so for the       next 24 hours you should not:  A. Drive an automobile  B. Make any legal decisions  C. Drink any alcoholic beverages  2)  You may resume your regular meals upon return home.  3)  A responsible adult must take you home.  Someone should stay with you for a few          hours, then be available by phone for the remainder of the treatment day.  4)  You May experience any of the following symptoms:  Headache, Nausea and a dry mouth (due to the medications you were given),  temporary memory loss and some confusion, or sore muscles (a warm bath  should help this).  If you you experience any of these symptoms let us know on                your return visit.  5)  Report any of the following: any acute discomfort, severe headache, or temperature        greater than 100.5 F.   Also report any unusual redness, swelling, drainage, or pain         at your IV site.    You may report Symptoms to:  ECT PROGRAM- [place] [facility]          Phone: [phone], ECT Department           or Dr. Shary Keylapac's office [phone]  6)  Your next ECT Treatment is Wednesday January 16   We will call 2 days prior to your scheduled appointment for arrival times.  7)  Nothing to eat or drink after midnight the night before your procedure.  8)  Take    With a sip of water the morning of your procedure.  9)  Other Instructions: Call [phone] to cancel the morning of your procedure due         to illness or emergency.  10) We will call within 72 hours to assess how you are feeling.

## 2017-07-29 ENCOUNTER — Encounter (HOSPITAL_BASED_OUTPATIENT_CLINIC_OR_DEPARTMENT_OTHER)
Admission: RE | Admit: 2017-07-29 | Discharge: 2017-07-29 | Disposition: A | Discharge status: Home / Self Care | Payer: BLUE CROSS/BLUE SHIELD | Source: Ambulatory Visit | Attending: Psychiatry | Admitting: Psychiatry

## 2017-07-29 ENCOUNTER — Encounter: Payer: Self-pay | Admitting: Anesthesiology

## 2017-07-29 ENCOUNTER — Other Ambulatory Visit: Payer: Self-pay | Admitting: Psychiatry

## 2017-07-29 DIAGNOSIS — Z87891 Personal history of nicotine dependence: Secondary | ICD-10-CM | POA: Diagnosis not present

## 2017-07-29 DIAGNOSIS — F339 Major depressive disorder, recurrent, unspecified: Secondary | ICD-10-CM | POA: Diagnosis not present

## 2017-07-29 DIAGNOSIS — F338 Other recurrent depressive disorders: Secondary | ICD-10-CM | POA: Diagnosis not present

## 2017-07-29 DIAGNOSIS — R45851 Suicidal ideations: Secondary | ICD-10-CM | POA: Diagnosis not present

## 2017-07-29 DIAGNOSIS — F332 Major depressive disorder, recurrent severe without psychotic features: Secondary | ICD-10-CM

## 2017-07-29 DIAGNOSIS — K589 Irritable bowel syndrome without diarrhea: Secondary | ICD-10-CM | POA: Diagnosis not present

## 2017-07-29 DIAGNOSIS — F419 Anxiety disorder, unspecified: Secondary | ICD-10-CM | POA: Diagnosis not present

## 2017-07-29 DIAGNOSIS — F909 Attention-deficit hyperactivity disorder, unspecified type: Secondary | ICD-10-CM | POA: Diagnosis not present

## 2017-07-29 DIAGNOSIS — F319 Bipolar disorder, unspecified: Secondary | ICD-10-CM | POA: Diagnosis not present

## 2017-07-29 DIAGNOSIS — K219 Gastro-esophageal reflux disease without esophagitis: Secondary | ICD-10-CM | POA: Diagnosis not present

## 2017-07-29 DIAGNOSIS — F329 Major depressive disorder, single episode, unspecified: Secondary | ICD-10-CM | POA: Diagnosis not present

## 2017-07-29 MED ORDER — MIDAZOLAM HCL 5 MG/5ML IJ SOLN
INTRAMUSCULAR | Status: AC
  Filled 2017-07-29: qty 10

## 2017-07-29 MED ORDER — SUCCINYLCHOLINE CHLORIDE 20 MG/ML IJ SOLN
INTRAMUSCULAR | Status: AC
  Filled 2017-07-29: qty 1

## 2017-07-29 MED ORDER — KETOROLAC TROMETHAMINE 30 MG/ML IJ SOLN
INTRAMUSCULAR | Status: AC
  Administered 2017-07-29: 30 mg via INTRAVENOUS
  Filled 2017-07-29: qty 1

## 2017-07-29 MED ORDER — SUCCINYLCHOLINE CHLORIDE 200 MG/10ML IV SOSY
PREFILLED_SYRINGE | INTRAVENOUS | Status: DC | PRN
  Administered 2017-07-29: 150 mg via INTRAVENOUS

## 2017-07-29 MED ORDER — HALOPERIDOL LACTATE 5 MG/ML IJ SOLN
INTRAMUSCULAR | Status: AC
  Filled 2017-07-29: qty 1

## 2017-07-29 MED ORDER — MIDAZOLAM HCL 2 MG/2ML IJ SOLN: 8.0000 mg | Freq: Once | INTRAMUSCULAR | Status: DC

## 2017-07-29 MED ORDER — GLYCOPYRROLATE 0.2 MG/ML IJ SOLN
0.1000 mg | Freq: Once | INTRAMUSCULAR | Status: AC
  Administered 2017-07-29: 0.1 mg via INTRAVENOUS

## 2017-07-29 MED ORDER — HALOPERIDOL LACTATE 5 MG/ML IJ SOLN
5.0000 mg | Freq: Once | INTRAMUSCULAR | Status: DC
  Filled 2017-07-29: qty 1

## 2017-07-29 MED ORDER — ZIPRASIDONE MESYLATE 20 MG IM SOLR
20.0000 mg | Freq: Once | INTRAMUSCULAR | Status: AC
  Administered 2017-07-29: 20 mg via INTRAMUSCULAR
  Filled 2017-07-29 (×2): qty 20

## 2017-07-29 MED ORDER — KETOROLAC TROMETHAMINE 30 MG/ML IJ SOLN
30.0000 mg | Freq: Once | INTRAMUSCULAR | Status: AC
  Administered 2017-07-29: 30 mg via INTRAVENOUS

## 2017-07-29 MED ORDER — SODIUM CHLORIDE 0.9 % IV SOLN
500.0000 mL | Freq: Once | INTRAVENOUS | Status: AC
  Administered 2017-07-29: 500 mL via INTRAVENOUS

## 2017-07-29 MED ORDER — GLYCOPYRROLATE 0.2 MG/ML IJ SOLN
INTRAMUSCULAR | Status: AC
  Administered 2017-07-29: 0.1 mg via INTRAVENOUS
  Filled 2017-07-29: qty 1

## 2017-07-29 MED ORDER — METHOHEXITAL SODIUM 100 MG/10ML IV SOSY
PREFILLED_SYRINGE | INTRAVENOUS | Status: DC | PRN
  Administered 2017-07-29: 120 mg via INTRAVENOUS

## 2017-07-29 MED ORDER — SODIUM CHLORIDE 0.9 % IV SOLN
INTRAVENOUS | Status: DC | PRN
  Administered 2017-07-29: 10:00:00 via INTRAVENOUS

## 2017-07-29 MED ORDER — MIDAZOLAM HCL 2 MG/2ML IJ SOLN
INTRAMUSCULAR | Status: DC | PRN
  Administered 2017-07-29: 8 mg via INTRAVENOUS

## 2017-07-29 MED ORDER — HALOPERIDOL LACTATE 5 MG/ML IJ SOLN
INTRAMUSCULAR | Status: DC | PRN
  Administered 2017-07-29: 5 mg via INTRAVENOUS

## 2017-07-29 NOTE — Anesthesia Preprocedure Evaluation (Signed)
Anesthesia Evaluation  Patient identified by MRN, date of birth, ID band Patient awake    Reviewed: Allergy & Precautions, NPO status , Patient's Chart, lab work & pertinent test results  History of Anesthesia Complications Negative for: history of anesthetic complications  Airway Mallampati: III  TM Distance: >3 FB Neck ROM: Full    Dental no notable dental hx.    Pulmonary sleep apnea , neg COPD, former smoker,    breath sounds clear to auscultation- rhonchi (-) wheezing      Cardiovascular Exercise Tolerance: Good (-) hypertension(-) CAD, (-) Past MI and (-) Cardiac Stents  Rhythm:Regular Rate:Normal - Systolic murmurs and - Diastolic murmurs    Neuro/Psych PSYCHIATRIC DISORDERS Anxiety Depression Bipolar Disorder negative neurological ROS     GI/Hepatic Neg liver ROS, GERD  ,  Endo/Other  negative endocrine ROSneg diabetes  Renal/GU negative Renal ROS     Musculoskeletal negative musculoskeletal ROS (+)   Abdominal (+) + obese,   Peds  Hematology negative hematology ROS (+)   Anesthesia Other Findings Past Medical History: No date: Acne No date: ADD (attention deficit disorder) No date: ADHD (attention deficit hyperactivity disorder) No date: Allergy No date: Anxiety No date: Bipolar 1 disorder (HCC) No date: Depression No date: GERD (gastroesophageal reflux disease) No date: IBS (irritable bowel syndrome)   Reproductive/Obstetrics                             Anesthesia Physical Anesthesia Plan  ASA: II  Anesthesia Plan: General   Post-op Pain Management:    Induction: Intravenous  PONV Risk Score and Plan: 1  Airway Management Planned: Mask  Additional Equipment:   Intra-op Plan:   Post-operative Plan:   Informed Consent: I have reviewed the patients History and Physical, chart, labs and discussed the procedure including the risks, benefits and alternatives for  the proposed anesthesia with the patient or authorized representative who has indicated his/her understanding and acceptance.   Dental advisory given  Plan Discussed with: CRNA and Anesthesiologist  Anesthesia Plan Comments:         Anesthesia Quick Evaluation

## 2017-07-29 NOTE — Discharge Instructions (Signed)
1)  The drugs that you have been given will stay in your system until tomorrow so for the       next 24 hours you should not:  A. Drive an automobile  B. Make any legal decisions  C. Drink any alcoholic beverages  2)  You may resume your regular meals upon return home.  3)  A responsible adult must take you home.  Someone should stay with you for a few          hours, then be available by phone for the remainder of the treatment day.  4)  You May experience any of the following symptoms:  Headache, Nausea and a dry mouth (due to the medications you were given),  temporary memory loss and some confusion, or sore muscles (a warm bath  should help this).  If you you experience any of these symptoms let us know on                your return visit.  5)  Report any of the following: any acute discomfort, severe headache, or temperature        greater than 100.5 F.   Also report any unusual redness, swelling, drainage, or pain         at your IV site.    You may report Symptoms to:  ECT PROGRAM- [place] [facility]          Phone: [phone], ECT Department           or Dr. Shary Keylapac's office [phone]  6)  Your next ECT Treatment is Friday July 31, 2017 at 8:30   We will call 2 days prior to your scheduled appointment for arrival times.  7)  Nothing to eat or drink after midnight the night before your procedure.  8)  Take      With a sip of water the morning of your procedure.  9)  Other Instructions: Call [phone] to cancel the morning of your procedure due         to illness or emergency.  10) We will call within 72 hours to assess how you are feeling.

## 2017-07-29 NOTE — Procedures (Signed)
ECT SERVICES Physician's Interval Evaluation & Treatment Note  Patient Identification: Herbert Bryant MRN:  [id number] Date of Evaluation:  07/29/2017 TX #: 4  MADRS:   MMSE:   P.E. Findings:  No change to physical exam heart and lungs normal vitals unremarkable  Psychiatric Interval Note:  Affect seems about the same.  Complains of some memory problems but Mini-Mental status is all normal.  Complains of a little less motivation.  Still passive suicidal ideation.  Subjective:  Patient is a 37 y.o. male seen for evaluation for Electroconvulsive Therapy. See note above still feeling depressed  Treatment Summary:   []   Right Unilateral             [x]  Bilateral   % Energy : 1.0 ms 25%   Impedance: 920 ohms  Seizure Energy Index: 21,713 V squared  Postictal Suppression Index: Good  Seizure Concordance Index: 98%  Medications  Pre Shock: Robinul 0.1 mg Toradol 30 mg Brevital 120 mg succinylcholine 150 mg  Post Shock: Versed 8 mg Haldol 5 mg Geodon 20 mg IM  Seizure Duration: 41 seconds by EMG 59 seconds EEG   Comments: Follow-up on Friday  Lungs:  [x]   Clear to auscultation               []  Other:   Heart:    [x]   Regular rhythm             []  irregular rhythm    [x]   Previous H&P reviewed, patient examined and there are NO CHANGES                 []   Previous H&P reviewed, patient examined and there are changes noted.   [name], MD 1/16/201910:49 AM

## 2017-07-29 NOTE — Anesthesia Postprocedure Evaluation (Signed)
Anesthesia Post Note  Patient: [name]  Procedure(s) Performed: ECT TX  Patient location during evaluation: PACU Anesthesia Type: General Level of consciousness: awake and alert Pain management: pain level controlled Vital Signs Assessment: post-procedure vital signs reviewed and stable Respiratory status: spontaneous breathing, nonlabored ventilation and respiratory function stable Cardiovascular status: blood pressure returned to baseline and stable Postop Assessment: no signs of nausea or vomiting Anesthetic complications: no     Last Vitals:  Vitals:   07/29/17 1145 07/29/17 1156  BP: (!) 141/95 (!) 181/87  Pulse: 77 71  Resp: 14 16  Temp: 36.7 C 36.6 C  SpO2: 98%     Last Pain:  Vitals:   07/29/17 1156  TempSrc: Oral  PainSc: 4                  Rowen Hur

## 2017-07-29 NOTE — H&P (Signed)
Herbert Bryant is an 37 y.o. male.   Chief Complaint: Mood continues to feel depressed.  Actually noticed slightly less motivation to some activities.  Having a little bit of memory loss although Mini-Mental status exam perfect. HPI: Chronic recurrent severe depression  Past Medical History:  Diagnosis Date  . Acne   . ADD (attention deficit disorder)   . ADHD (attention deficit hyperactivity disorder)   . Allergy   . Anxiety   . Bipolar 1 disorder (HCC)   . Depression   . GERD (gastroesophageal reflux disease)   . IBS (irritable bowel syndrome)     Past Surgical History:  Procedure Laterality Date  . MOUTH SURGERY  1993   skin graft  . WISDOM TOOTH EXTRACTION      Family History  Problem Relation Age of Onset  . Arthritis Maternal Grandmother   . Stroke Maternal Grandmother   . Hypertension Maternal Grandmother   . Arthritis Maternal Grandfather   . Emphysema Paternal Grandfather   . Alcohol abuse Mother   . Depression Mother    Social History:  reports that he quit smoking about 5 years ago. His smoking use included cigarettes. He smoked 0.50 packs per day. he has never used smokeless tobacco. He reports that he uses drugs. Drug: Marijuana. He reports that he does not drink alcohol.  Allergies: No Known Allergies   (Not in a hospital admission)  No results found for this or any previous visit (from the past 48 hour(s)). No results found.  Review of Systems  Constitutional: Negative.   HENT: Negative.   Eyes: Negative.   Respiratory: Negative.   Cardiovascular: Negative.   Gastrointestinal: Negative.   Musculoskeletal: Negative.   Skin: Negative.   Neurological: Negative.   Psychiatric/Behavioral: Positive for depression, memory loss and suicidal ideas. Negative for hallucinations and substance abuse. The patient is not nervous/anxious and does not have insomnia.     Blood pressure (!) 155/89, pulse 63, temperature 98.8 F (37.1 C), temperature source Oral,  resp. rate 18, height 5\' 10"  (1.778 m), weight (!) 322 lb (146.1 kg), SpO2 97 %. Physical Exam  Nursing note and vitals reviewed. Constitutional: He appears well-developed and well-nourished.  HENT:  Head: Normocephalic and atraumatic.  Eyes: Conjunctivae are normal. Pupils are equal, round, and reactive to light.  Neck: Normal range of motion.  Cardiovascular: Regular rhythm and normal heart sounds.  Respiratory: Effort normal. No respiratory distress.  GI: Soft.  Musculoskeletal: Normal range of motion.  Neurological: He is alert.  Skin: Skin is warm and dry.  Psychiatric: His speech is normal and behavior is normal. Judgment normal. His affect is blunt. Cognition and memory are normal. He expresses suicidal ideation. He expresses no suicidal plans.     Assessment/Plan Continue course of ECT as currently planned Next treatment Friday  [name], MD 07/29/2017, 10:47 AM

## 2017-07-29 NOTE — Anesthesia Post-op Follow-up Note (Signed)
Anesthesia QCDR form completed.        

## 2017-07-29 NOTE — Anesthesia Procedure Notes (Signed)
Date/Time: 07/29/2017 10:55 AM Performed by: Lily KocherPeralta, Tambria Pfannenstiel, CRNA Pre-anesthesia Checklist: Patient identified, Emergency Drugs available, Suction available and Patient being monitored Patient Re-evaluated:Patient Re-evaluated prior to induction Oxygen Delivery Method: Circle system utilized Preoxygenation: Pre-oxygenation with 100% oxygen Induction Type: IV induction Ventilation: Mask ventilation without difficulty and Mask ventilation throughout procedure Airway Equipment and Method: Bite block Placement Confirmation: positive ETCO2 Dental Injury: Teeth and Oropharynx as per pre-operative assessment

## 2017-07-29 NOTE — Transfer of Care (Signed)
Immediate Anesthesia Transfer of Care Note  Patient: Herbert Bryant  Procedure(s) Performed: ECT TX  Patient Location: PACU  Anesthesia Type:General  Level of Consciousness: sedated  Airway & Oxygen Therapy: Patient Spontanous Breathing and Patient connected to face mask oxygen  Post-op Assessment: Report given to RN and Post -op Vital signs reviewed and stable  Post vital signs: Reviewed and stable  Last Vitals:  Vitals:   07/29/17 0839 07/29/17 1105  BP: (!) 155/89 (!) (P) 154/85  Pulse: 63 (P) 87  Resp: 18 (P) 18  Temp: 37.1 C (P) 37 C  SpO2: 97% (P) 100%    Last Pain:  Vitals:   07/29/17 0839  TempSrc: Oral  PainSc: 0-No pain         Complications: No apparent anesthesia complications

## 2017-07-30 ENCOUNTER — Encounter: Payer: Self-pay | Admitting: Family Medicine

## 2017-07-30 ENCOUNTER — Ambulatory Visit: Payer: BLUE CROSS/BLUE SHIELD | Admitting: Family Medicine

## 2017-07-30 VITALS — BP 144/88 | HR 83 | Wt 319.6 lb

## 2017-07-30 DIAGNOSIS — F322 Major depressive disorder, single episode, severe without psychotic features: Secondary | ICD-10-CM

## 2017-07-30 DIAGNOSIS — S01512A Laceration without foreign body of oral cavity, initial encounter: Secondary | ICD-10-CM | POA: Diagnosis not present

## 2017-07-30 NOTE — Progress Notes (Signed)
   Subjective:    Patient ID: Herbert Bryant, male    DOB: 09-10-1980, 37 y.o.   MRN: [id number]  HPI He is here for consult concerning trauma to his tongue.  He was recently in the psychiatric unit and treated for his depression.  He was given ECT treatments and noted difficulty with bleeding from his tongue after this.  He states that he is feeling somewhat better with his underlying depression.   Review of Systems     Objective:   Physical Exam Alert and in no distress with a relatively normal affect. Oral examination does show a healing laceration to the right lateral part of the inferior portion of the tongue The rest of the oral exam was negative.      Assessment & Plan:  Laceration of tongue without complication, initial encounter  Depression, major, single episode, severe (HCC) I explained that the tongue should slowly heal with probably no residual damage.  Did recommend he irrigate his mouth after eating to make sure that he does not get any food particle stuck in it.  He was comfortable with this.

## 2017-07-31 ENCOUNTER — Encounter: Payer: Self-pay | Admitting: Anesthesiology

## 2017-07-31 ENCOUNTER — Encounter (HOSPITAL_BASED_OUTPATIENT_CLINIC_OR_DEPARTMENT_OTHER)
Admission: RE | Admit: 2017-07-31 | Discharge: 2017-07-31 | Disposition: A | Discharge status: Home / Self Care | Payer: BLUE CROSS/BLUE SHIELD | Source: Ambulatory Visit | Attending: Psychiatry | Admitting: Psychiatry

## 2017-07-31 ENCOUNTER — Other Ambulatory Visit: Payer: Self-pay | Admitting: Psychiatry

## 2017-07-31 DIAGNOSIS — F319 Bipolar disorder, unspecified: Secondary | ICD-10-CM | POA: Diagnosis not present

## 2017-07-31 DIAGNOSIS — F332 Major depressive disorder, recurrent severe without psychotic features: Secondary | ICD-10-CM

## 2017-07-31 DIAGNOSIS — R45851 Suicidal ideations: Secondary | ICD-10-CM | POA: Diagnosis not present

## 2017-07-31 DIAGNOSIS — Z87891 Personal history of nicotine dependence: Secondary | ICD-10-CM | POA: Diagnosis not present

## 2017-07-31 DIAGNOSIS — F338 Other recurrent depressive disorders: Secondary | ICD-10-CM | POA: Diagnosis not present

## 2017-07-31 DIAGNOSIS — K219 Gastro-esophageal reflux disease without esophagitis: Secondary | ICD-10-CM | POA: Diagnosis not present

## 2017-07-31 DIAGNOSIS — K589 Irritable bowel syndrome without diarrhea: Secondary | ICD-10-CM | POA: Diagnosis not present

## 2017-07-31 DIAGNOSIS — F908 Attention-deficit hyperactivity disorder, other type: Secondary | ICD-10-CM | POA: Diagnosis not present

## 2017-07-31 DIAGNOSIS — F329 Major depressive disorder, single episode, unspecified: Secondary | ICD-10-CM | POA: Diagnosis not present

## 2017-07-31 DIAGNOSIS — F909 Attention-deficit hyperactivity disorder, unspecified type: Secondary | ICD-10-CM | POA: Diagnosis not present

## 2017-07-31 DIAGNOSIS — F419 Anxiety disorder, unspecified: Secondary | ICD-10-CM | POA: Diagnosis not present

## 2017-07-31 MED ORDER — ZIPRASIDONE MESYLATE 20 MG IM SOLR
20.0000 mg | Freq: Once | INTRAMUSCULAR | Status: AC
  Administered 2017-07-31: 20 mg via INTRAMUSCULAR
  Filled 2017-07-31: qty 20

## 2017-07-31 MED ORDER — HALOPERIDOL LACTATE 5 MG/ML IJ SOLN
INTRAMUSCULAR | Status: DC | PRN
  Administered 2017-07-31: 5 mg via INTRAVENOUS

## 2017-07-31 MED ORDER — MIDAZOLAM HCL 2 MG/2ML IJ SOLN
INTRAMUSCULAR | Status: DC | PRN
  Administered 2017-07-31: 8 mg via INTRAVENOUS

## 2017-07-31 MED ORDER — MIDAZOLAM HCL 5 MG/5ML IJ SOLN
INTRAMUSCULAR | Status: AC
  Filled 2017-07-31: qty 10

## 2017-07-31 MED ORDER — METHOHEXITAL SODIUM 100 MG/10ML IV SOSY
PREFILLED_SYRINGE | INTRAVENOUS | Status: DC | PRN
  Administered 2017-07-31: 120 mg via INTRAVENOUS

## 2017-07-31 MED ORDER — KETOROLAC TROMETHAMINE 30 MG/ML IJ SOLN
INTRAMUSCULAR | Status: AC
  Administered 2017-07-31: 30 mg via INTRAVENOUS
  Filled 2017-07-31: qty 1

## 2017-07-31 MED ORDER — SODIUM CHLORIDE 0.9 % IV SOLN
500.0000 mL | Freq: Once | INTRAVENOUS | Status: AC
  Administered 2017-07-31: 500 mL via INTRAVENOUS

## 2017-07-31 MED ORDER — KETOROLAC TROMETHAMINE 30 MG/ML IJ SOLN
30.0000 mg | Freq: Once | INTRAMUSCULAR | Status: AC
  Administered 2017-07-31: 30 mg via INTRAVENOUS

## 2017-07-31 MED ORDER — GLYCOPYRROLATE 0.2 MG/ML IJ SOLN
0.1000 mg | Freq: Once | INTRAMUSCULAR | Status: AC
  Administered 2017-07-31: 0.1 mg via INTRAVENOUS

## 2017-07-31 MED ORDER — SUCCINYLCHOLINE CHLORIDE 200 MG/10ML IV SOSY
PREFILLED_SYRINGE | INTRAVENOUS | Status: DC | PRN
  Administered 2017-07-31: 150 mg via INTRAVENOUS

## 2017-07-31 MED ORDER — SODIUM CHLORIDE 0.9 % IV SOLN
INTRAVENOUS | Status: DC | PRN
  Administered 2017-07-31: 09:00:00 via INTRAVENOUS

## 2017-07-31 MED ORDER — GLYCOPYRROLATE 0.2 MG/ML IJ SOLN
INTRAMUSCULAR | Status: AC
  Administered 2017-07-31: 0.1 mg via INTRAVENOUS
  Filled 2017-07-31: qty 1

## 2017-07-31 NOTE — H&P (Signed)
Herbert GuernseyMichael O Bryant is an 37 y.o. male.   Chief Complaint: Patient continues to feel depressed although he says yesterday was the first day he did not have any active suicidal ideation which is an improvement HPI: Long-standing recurrent depression and anxiety  Past Medical History:  Diagnosis Date  . Acne   . ADD (attention deficit disorder)   . ADHD (attention deficit hyperactivity disorder)   . Allergy   . Anxiety   . Bipolar 1 disorder (HCC)   . Depression   . GERD (gastroesophageal reflux disease)   . IBS (irritable bowel syndrome)     Past Surgical History:  Procedure Laterality Date  . MOUTH SURGERY  1993   skin graft  . WISDOM TOOTH EXTRACTION      Family History  Problem Relation Age of Onset  . Arthritis Maternal Grandmother   . Stroke Maternal Grandmother   . Hypertension Maternal Grandmother   . Arthritis Maternal Grandfather   . Emphysema Paternal Grandfather   . Alcohol abuse Mother   . Depression Mother    Social History:  reports that he quit smoking about 5 years ago. His smoking use included cigarettes. He smoked 0.50 packs per day. he has never used smokeless tobacco. He reports that he uses drugs. Drug: Marijuana. He reports that he does not drink alcohol.  Allergies: No Known Allergies   (Not in a hospital admission)  No results found for this or any previous visit (from the past 48 hour(s)). No results found.  Review of Systems  Constitutional: Negative.   HENT: Negative.   Eyes: Negative.   Respiratory: Negative.   Cardiovascular: Negative.   Gastrointestinal: Negative.   Musculoskeletal: Negative.   Skin: Negative.   Neurological: Negative.   Psychiatric/Behavioral: Positive for depression and memory loss. Negative for hallucinations, substance abuse and suicidal ideas. The patient is not nervous/anxious and does not have insomnia.     There were no vitals taken for this visit. Physical Exam  Nursing note and vitals  reviewed. Constitutional: He appears well-developed and well-nourished.  HENT:  Head: Normocephalic and atraumatic.  Eyes: Conjunctivae are normal. Pupils are equal, round, and reactive to light.  Neck: Normal range of motion.  Cardiovascular: Regular rhythm and normal heart sounds.  Respiratory: Effort normal.  GI: Soft.  Musculoskeletal: Normal range of motion.  Neurological: He is alert.  Skin: Skin is warm and dry.  Psychiatric: His speech is normal and behavior is normal. Judgment and thought content normal. Cognition and memory are normal. He exhibits a depressed mood.     Assessment/Plan Continue with 3 times a week treatment after treatment today continue into next week as he is starting to show some signs of possible improvement  Mordecai RasmussenJohn Enza Shone, MD 07/31/2017, 11:10 AM

## 2017-07-31 NOTE — Anesthesia Postprocedure Evaluation (Signed)
Anesthesia Post Note  Patient: Herbert Bryant  Procedure(s) Performed: ECT TX  Patient location during evaluation: PACU Anesthesia Type: General Level of consciousness: awake and alert Pain management: pain level controlled Vital Signs Assessment: post-procedure vital signs reviewed and stable Respiratory status: spontaneous breathing, nonlabored ventilation, respiratory function stable and patient connected to nasal cannula oxygen Cardiovascular status: blood pressure returned to baseline and stable Postop Assessment: no apparent nausea or vomiting Anesthetic complications: no     Last Vitals:  Vitals:   07/31/17 1145 07/31/17 1153  BP:  136/89  Pulse: (!) 107 (!) 111  Resp: (!) 22 19  Temp:    SpO2: 98% 97%    Last Pain:  Vitals:   07/31/17 1148  PainSc: 0-No pain                 Cleda MccreedyJoseph K Piscitello

## 2017-07-31 NOTE — Transfer of Care (Signed)
Immediate Anesthesia Transfer of Care Note  Patient: Herbert Bryant  Procedure(s) Performed: ECT TX  Patient Location: PACU  Anesthesia Type:General  Level of Consciousness: sedated  Airway & Oxygen Therapy: Patient Spontanous Breathing and Patient connected to face mask oxygen  Post-op Assessment: Report given to RN and Post -op Vital signs reviewed and stable  Post vital signs: Reviewed and stable  Last Vitals: There were no vitals filed for this visit.  Last Pain:  Vitals:   07/31/17 0846  PainSc: 2          Complications: No apparent anesthesia complications

## 2017-07-31 NOTE — Anesthesia Preprocedure Evaluation (Signed)
Anesthesia Evaluation  Patient identified by MRN, date of birth, ID band Patient awake    Reviewed: Allergy & Precautions, H&P , NPO status , Patient's Chart, lab work & pertinent test results  History of Anesthesia Complications Negative for: history of anesthetic complications  Airway Mallampati: III  TM Distance: <3 FB Neck ROM: full    Dental  (+) Chipped, Poor Dentition   Pulmonary neg shortness of breath, sleep apnea , former smoker,           Cardiovascular Exercise Tolerance: Good (-) angina(-) Past MI and (-) DOE negative cardio ROS       Neuro/Psych PSYCHIATRIC DISORDERS Anxiety Depression Bipolar Disorder negative neurological ROS     GI/Hepatic Neg liver ROS, GERD  Medicated and Controlled,  Endo/Other  negative endocrine ROS  Renal/GU negative Renal ROS  negative genitourinary   Musculoskeletal   Abdominal   Peds  Hematology negative hematology ROS (+)   Anesthesia Other Findings Past Medical History: No date: Acne No date: ADD (attention deficit disorder) No date: ADHD (attention deficit hyperactivity disorder) No date: Allergy No date: Anxiety No date: Bipolar 1 disorder (HCC) No date: Depression No date: GERD (gastroesophageal reflux disease) No date: IBS (irritable bowel syndrome)  Past Surgical History: 1993: MOUTH SURGERY     Comment:  skin graft No date: WISDOM TOOTH EXTRACTION  BMI    Body Mass Index:  46.20 kg/m      Reproductive/Obstetrics negative OB ROS                             Anesthesia Physical  Anesthesia Plan  ASA: III  Anesthesia Plan: General   Post-op Pain Management:    Induction: Intravenous  PONV Risk Score and Plan: TIVA  Airway Management Planned: Natural Airway and Mask  Additional Equipment:   Intra-op Plan:   Post-operative Plan:   Informed Consent: I have reviewed the patients History and Physical, chart, labs  and discussed the procedure including the risks, benefits and alternatives for the proposed anesthesia with the patient or authorized representative who has indicated his/her understanding and acceptance.   Dental Advisory Given  Plan Discussed with: Anesthesiologist, CRNA and Surgeon  Anesthesia Plan Comments: (Patient consented for risks of anesthesia including but not limited to:  - adverse reactions to medications - risk of intubation if required - damage to teeth, lips or other oral mucosa - sore throat or hoarseness - Damage to heart, brain, lungs or loss of life  Patient voiced understanding.)        Anesthesia Quick Evaluation

## 2017-07-31 NOTE — Anesthesia Post-op Follow-up Note (Signed)
Anesthesia QCDR form completed.        

## 2017-07-31 NOTE — Procedures (Signed)
ECT SERVICES Physician's Interval Evaluation & Treatment Note  Patient Identification: Melany GuernseyMichael O Celli MRN:  [id number] Date of Evaluation:  07/31/2017 TX #: 5  MADRS:   MMSE:   P.E. Findings:  No change to physical exam  Psychiatric Interval Note:  Mood reported as improved.  Affect no change  Subjective:  Patient is a 37 y.o. male seen for evaluation for Electroconvulsive Therapy. Subjectively feeling less suicidal  Treatment Summary:   []   Right Unilateral             [x]  Bilateral   % Energy : 1.0 ms 25%   Impedance: 1090 ohms  Seizure Energy Index: 21,601 V squared  Postictal Suppression Index: 86%  Seizure Concordance Index: 99%  Medications  Pre Shock: Robinul 0.1 mg Toradol 30 mg Brevital 120 mg succinylcholine 150 mg  Post Shock: Versed 8 mg Haldol 5 mg Geodon 20 mg  Seizure Duration: 40 seconds by EMG 76 seconds by EEG   Comments: Follow-up Monday  Lungs:  [x]   Clear to auscultation               []  Other:   Heart:    [x]   Regular rhythm             []  irregular rhythm    [x]   Previous H&P reviewed, patient examined and there are NO CHANGES                 []   Previous H&P reviewed, patient examined and there are changes noted.   Mordecai RasmussenJohn Nastasha Reising, MD 1/18/201911:12 AM

## 2017-07-31 NOTE — OR Nursing (Signed)
Patient appears to be very sleepy easily aroused

## 2017-07-31 NOTE — Discharge Instructions (Signed)
1)  The drugs that you have been given will stay in your system until tomorrow so for the       next 24 hours you should not:  A. Drive an automobile  B. Make any legal decisions  C. Drink any alcoholic beverages  2)  You may resume your regular meals upon return home.  3)  A responsible adult must take you home.  Someone should stay with you for a few          hours, then be available by phone for the remainder of the treatment day.  4)  You May experience any of the following symptoms:  Headache, Nausea and a dry mouth (due to the medications you were given),  temporary memory loss and some confusion, or sore muscles (a warm bath  should help this).  If you you experience any of these symptoms let us know on                your return visit.  5)  Report any of the following: any acute discomfort, severe headache, or temperature        greater than 100.5 F.   Also report any unusual redness, swelling, drainage, or pain         at your IV site.    You may report Symptoms to:  ECT PRO[facility]- [place] [facility]          Phone: [phone], ECT Department           or Dr. Shary Keylapac's office [phone]  6)  Your next ECT Treatment is Monday 21, at 8:30   We will call 2 days prior to your scheduled appointment for arrival times.  7)  Nothing to eat or drink after midnight the night before your procedure.  8)  Take      With a sip of water the morning of your procedure.  9)  Other Instructions: Call [phone] to cancel the morning of your procedure due         to illness or emergency.  10) We will call within 72 hours to assess how you are feeling.

## 2017-08-02 ENCOUNTER — Other Ambulatory Visit: Payer: Self-pay | Admitting: Psychiatry

## 2017-08-03 ENCOUNTER — Encounter: Payer: Self-pay | Admitting: Certified Registered Nurse Anesthetist

## 2017-08-03 ENCOUNTER — Encounter
Admission: RE | Admit: 2017-08-03 | Discharge: 2017-08-03 | Disposition: A | Discharge status: Home / Self Care | Payer: BLUE CROSS/BLUE SHIELD | Source: Ambulatory Visit | Attending: Psychiatry | Admitting: Psychiatry

## 2017-08-03 DIAGNOSIS — G4730 Sleep apnea, unspecified: Secondary | ICD-10-CM | POA: Diagnosis not present

## 2017-08-03 DIAGNOSIS — F909 Attention-deficit hyperactivity disorder, unspecified type: Secondary | ICD-10-CM | POA: Diagnosis not present

## 2017-08-03 DIAGNOSIS — K589 Irritable bowel syndrome without diarrhea: Secondary | ICD-10-CM | POA: Insufficient documentation

## 2017-08-03 DIAGNOSIS — Z811 Family history of alcohol abuse and dependence: Secondary | ICD-10-CM | POA: Insufficient documentation

## 2017-08-03 DIAGNOSIS — F332 Major depressive disorder, recurrent severe without psychotic features: Secondary | ICD-10-CM | POA: Insufficient documentation

## 2017-08-03 DIAGNOSIS — F319 Bipolar disorder, unspecified: Secondary | ICD-10-CM | POA: Diagnosis not present

## 2017-08-03 DIAGNOSIS — Z87891 Personal history of nicotine dependence: Secondary | ICD-10-CM | POA: Diagnosis not present

## 2017-08-03 DIAGNOSIS — K219 Gastro-esophageal reflux disease without esophagitis: Secondary | ICD-10-CM | POA: Diagnosis not present

## 2017-08-03 DIAGNOSIS — Z818 Family history of other mental and behavioral disorders: Secondary | ICD-10-CM | POA: Diagnosis not present

## 2017-08-03 DIAGNOSIS — F339 Major depressive disorder, recurrent, unspecified: Secondary | ICD-10-CM | POA: Diagnosis not present

## 2017-08-03 MED ORDER — GLYCOPYRROLATE 0.2 MG/ML IJ SOLN
0.1000 mg | Freq: Once | INTRAMUSCULAR | Status: AC
  Administered 2017-08-03: 0.1 mg via INTRAVENOUS

## 2017-08-03 MED ORDER — METHOHEXITAL SODIUM 100 MG/10ML IV SOSY
PREFILLED_SYRINGE | INTRAVENOUS | Status: DC | PRN
  Administered 2017-08-03: 120 mg via INTRAVENOUS

## 2017-08-03 MED ORDER — ZIPRASIDONE MESYLATE 20 MG IM SOLR
20.0000 mg | Freq: Once | INTRAMUSCULAR | Status: AC
  Administered 2017-08-03: 20 mg via INTRAMUSCULAR
  Filled 2017-08-03: qty 20

## 2017-08-03 MED ORDER — GLYCOPYRROLATE 0.2 MG/ML IJ SOLN
INTRAMUSCULAR | Status: AC
  Administered 2017-08-03: 0.1 mg via INTRAVENOUS
  Filled 2017-08-03: qty 1

## 2017-08-03 MED ORDER — KETOROLAC TROMETHAMINE 30 MG/ML IJ SOLN
INTRAMUSCULAR | Status: AC
  Administered 2017-08-03: 30 mg via INTRAVENOUS
  Filled 2017-08-03: qty 1

## 2017-08-03 MED ORDER — MIDAZOLAM HCL 5 MG/5ML IJ SOLN
INTRAMUSCULAR | Status: AC
  Filled 2017-08-03: qty 10

## 2017-08-03 MED ORDER — SODIUM CHLORIDE 0.9 % IV SOLN
500.0000 mL | Freq: Once | INTRAVENOUS | Status: AC
  Administered 2017-08-03: 500 mL via INTRAVENOUS

## 2017-08-03 MED ORDER — SUCCINYLCHOLINE CHLORIDE 20 MG/ML IJ SOLN
INTRAMUSCULAR | Status: DC | PRN
  Administered 2017-08-03: 150 mg via INTRAVENOUS

## 2017-08-03 MED ORDER — KETOROLAC TROMETHAMINE 30 MG/ML IJ SOLN
30.0000 mg | Freq: Once | INTRAMUSCULAR | Status: AC
  Administered 2017-08-03: 30 mg via INTRAVENOUS

## 2017-08-03 MED ORDER — IPRATROPIUM-ALBUTEROL 0.5-2.5 (3) MG/3ML IN SOLN
RESPIRATORY_TRACT | Status: AC
  Administered 2017-08-03: 3 mL via RESPIRATORY_TRACT
  Filled 2017-08-03: qty 3

## 2017-08-03 MED ORDER — MIDAZOLAM HCL 2 MG/2ML IJ SOLN: 8.0000 mg | Freq: Once | INTRAMUSCULAR | Status: DC

## 2017-08-03 MED ORDER — ONDANSETRON HCL 4 MG/2ML IJ SOLN
4.0000 mg | Freq: Once | INTRAMUSCULAR | Status: AC
  Administered 2017-08-03: 4 mg via INTRAVENOUS

## 2017-08-03 MED ORDER — MIDAZOLAM HCL 2 MG/2ML IJ SOLN
INTRAMUSCULAR | Status: DC | PRN
  Administered 2017-08-03: 8 mg via INTRAVENOUS

## 2017-08-03 MED ORDER — HALOPERIDOL LACTATE 5 MG/ML IJ SOLN
INTRAMUSCULAR | Status: DC | PRN
  Administered 2017-08-03: 5 mg via INTRAVENOUS

## 2017-08-03 MED ORDER — IPRATROPIUM-ALBUTEROL 0.5-2.5 (3) MG/3ML IN SOLN
3.0000 mL | Freq: Once | RESPIRATORY_TRACT | Status: AC
  Administered 2017-08-03: 3 mL via RESPIRATORY_TRACT

## 2017-08-03 MED ORDER — SODIUM CHLORIDE 0.9 % IV SOLN
INTRAVENOUS | Status: DC | PRN
  Administered 2017-08-03: 10:00:00 via INTRAVENOUS

## 2017-08-03 MED ORDER — ONDANSETRON HCL 4 MG/2ML IJ SOLN
INTRAMUSCULAR | Status: AC
  Administered 2017-08-03: 4 mg via INTRAVENOUS
  Filled 2017-08-03: qty 2

## 2017-08-03 NOTE — Discharge Instructions (Signed)
1)  The drugs that you have been given will stay in your system until tomorrow so for the       next 24 hours you should not:  A. Drive an automobile  B. Make any legal decisions  C. Drink any alcoholic beverages  2)  You may resume your regular meals upon return home.  3)  A responsible adult must take you home.  Someone should stay with you for a few          hours, then be available by phone for the remainder of the treatment day.  4)  You May experience any of the following symptoms:  Headache, Nausea and a dry mouth (due to the medications you were given),  temporary memory loss and some confusion, or sore muscles (a warm bath  should help this).  If you you experience any of these symptoms let us know on                your return visit.  5)  Report any of the following: any acute discomfort, severe headache, or temperature        greater than 100.5 F.   Also report any unusual redness, swelling, drainage, or pain         at your IV site.    You may report Symptoms to:  ECT PROGRAM- [place] [facility]          Phone: [phone], ECT Department           or Dr. Shary Keylapac's office [phone]  6)  Your next ECT Treatment is Wednesday January 23 8:00  We will call 2 days prior to your scheduled appointment for arrival times.  7)  Nothing to eat or drink after midnight the night before your procedure.  8)  Take     With a sip of water the morning of your procedure.  9)  Other Instructions: Call [phone] to cancel the morning of your procedure due         to illness or emergency.  10) We will call within 72 hours to assess how you are feeling.

## 2017-08-03 NOTE — Anesthesia Procedure Notes (Signed)
Performed by: Keaundre Thelin, CRNA Pre-anesthesia Checklist: Patient identified, Patient being monitored, Timeout performed, Emergency Drugs available and Suction available Patient Re-evaluated:Patient Re-evaluated prior to induction Oxygen Delivery Method: Circle system utilized Preoxygenation: Pre-oxygenation with 100% oxygen Ventilation: Mask ventilation without difficulty Airway Equipment and Method: Bite block Dental Injury: Teeth and Oropharynx as per pre-operative assessment        

## 2017-08-03 NOTE — Anesthesia Postprocedure Evaluation (Signed)
Anesthesia Post Note  Patient: Herbert GuernseyMichael O Bryant  Procedure(s) Performed: ECT TX  Patient location during evaluation: PACU Anesthesia Type: General Level of consciousness: awake and alert Pain management: pain level controlled Vital Signs Assessment: post-procedure vital signs reviewed and stable Respiratory status: spontaneous breathing, nonlabored ventilation and respiratory function stable Cardiovascular status: blood pressure returned to baseline and stable Postop Assessment: no signs of nausea or vomiting Anesthetic complications: no     Last Vitals:  Vitals:   08/03/17 1118 08/03/17 1128  BP: 130/81 132/89  Pulse: (!) 105 (!) 128  Resp: (!) 22 (!) 22  Temp:    SpO2: 95% 91%    Last Pain:  Vitals:   08/03/17 1108  TempSrc:   PainSc: Asleep                 Remy Voiles

## 2017-08-03 NOTE — Anesthesia Preprocedure Evaluation (Signed)
Anesthesia Evaluation  Patient identified by MRN, date of birth, ID band Patient awake    Reviewed: Allergy & Precautions, NPO status , Patient's Chart, lab work & pertinent test results  History of Anesthesia Complications Negative for: history of anesthetic complications  Airway Mallampati: III  TM Distance: >3 FB Neck ROM: Full    Dental no notable dental hx.    Pulmonary sleep apnea , neg COPD, former smoker,    breath sounds clear to auscultation- rhonchi (-) wheezing      Cardiovascular Exercise Tolerance: Good (-) hypertension(-) CAD, (-) Past MI and (-) Cardiac Stents  Rhythm:Regular Rate:Normal - Systolic murmurs and - Diastolic murmurs    Neuro/Psych PSYCHIATRIC DISORDERS Anxiety Depression Bipolar Disorder negative neurological ROS     GI/Hepatic Neg liver ROS, GERD  ,  Endo/Other  negative endocrine ROSneg diabetes  Renal/GU negative Renal ROS     Musculoskeletal negative musculoskeletal ROS (+)   Abdominal (+) + obese,   Peds  Hematology negative hematology ROS (+)   Anesthesia Other Findings Past Medical History: No date: Acne No date: ADD (attention deficit disorder) No date: ADHD (attention deficit hyperactivity disorder) No date: Allergy No date: Anxiety No date: Bipolar 1 disorder (HCC) No date: Depression No date: GERD (gastroesophageal reflux disease) No date: IBS (irritable bowel syndrome)   Reproductive/Obstetrics                             Anesthesia Physical Anesthesia Plan  ASA: II  Anesthesia Plan: General   Post-op Pain Management:    Induction: Intravenous  PONV Risk Score and Plan: 1  Airway Management Planned: Mask  Additional Equipment:   Intra-op Plan:   Post-operative Plan:   Informed Consent: I have reviewed the patients History and Physical, chart, labs and discussed the procedure including the risks, benefits and alternatives for  the proposed anesthesia with the patient or authorized representative who has indicated his/her understanding and acceptance.   Dental advisory given  Plan Discussed with: CRNA and Anesthesiologist  Anesthesia Plan Comments:         Anesthesia Quick Evaluation  

## 2017-08-03 NOTE — Transfer of Care (Signed)
Immediate Anesthesia Transfer of Care Note  Patient: Herbert GuernseyMichael O Bryant  Procedure(s) Performed: ECT TX  Patient Location: PACU  Anesthesia Type:General  Level of Consciousness: awake, alert  and oriented  Airway & Oxygen Therapy: Patient Spontanous Breathing and Patient connected to face mask oxygen  Post-op Assessment: Report given to RN and Post -op Vital signs reviewed and stable  Post vital signs: Reviewed and stable  Last Vitals:  Vitals:   08/03/17 0849  BP: (!) 157/84  Pulse: 79  Resp: 16  Temp: 36.6 C  SpO2: 98%    Last Pain:  Vitals:   08/03/17 0849  TempSrc: Oral  PainSc: 0-No pain         Complications: No apparent anesthesia complications

## 2017-08-03 NOTE — Anesthesia Post-op Follow-up Note (Signed)
Anesthesia QCDR form completed.        

## 2017-08-03 NOTE — H&P (Signed)
Herbert Bryant.   Chief Complaint: Patient continues to feel depressed without much improvement. HPI: History of recurrent severe depression without response to treatment  Past Medical History:  Diagnosis Date  . Acne   . ADD (attention deficit disorder)   . ADHD (attention deficit hyperactivity disorder)   . Allergy   . Anxiety   . Bipolar 1 disorder (HCC)   . Depression   . GERD (gastroesophageal reflux disease)   . IBS (irritable bowel syndrome)     Past Surgical History:  Procedure Laterality Date  . MOUTH SURGERY  1993   skin graft  . WISDOM TOOTH EXTRACTION      Family History  Problem Relation Age of Onset  . Arthritis Maternal Grandmother   . Stroke Maternal Grandmother   . Hypertension Maternal Grandmother   . Arthritis Maternal Grandfather   . Emphysema Paternal Grandfather   . Alcohol abuse Mother   . Depression Mother    Social History:  reports that he quit smoking about 5 years ago. His smoking use included cigarettes. He smoked 0.50 packs per day. he has never used smokeless tobacco. He reports that he uses drugs. Drug: Marijuana. He reports that he does not drink alcohol.  Allergies: No Known Allergies   (Not in a hospital admission)  No results found for this or any previous visit (from the past 48 hour(s)). No results found.  Review of Systems  Constitutional: Negative.   HENT: Negative.   Eyes: Negative.   Respiratory: Negative.   Cardiovascular: Negative.   Gastrointestinal: Negative.   Musculoskeletal: Negative.   Skin: Negative.   Neurological: Negative.   Psychiatric/Behavioral: Positive for depression, memory loss and suicidal ideas. Negative for hallucinations and substance abuse. The patient is nervous/anxious. The patient does not have insomnia.     Blood pressure (!) 157/84, pulse 79, temperature 97.9 F (36.6 C), temperature source Oral, resp. rate 16, height 5\' 10"  (1.778 m), weight (!) 142.9 kg (315 lb), SpO2  98 %. Physical Exam  Nursing note and vitals reviewed. Constitutional: He appears well-developed and well-nourished.  HENT:  Head: Normocephalic and atraumatic.  Eyes: Conjunctivae are normal. Pupils are equal, round, and reactive to light.  Neck: Normal range of motion.  Cardiovascular: Regular rhythm and normal heart sounds.  Respiratory: Effort normal. No respiratory distress.  GI: Soft.  Musculoskeletal: Normal range of motion.  Neurological: He is alert.  Skin: Skin is warm and dry.  Psychiatric: His speech is normal and behavior is normal. Judgment and thought content normal. He exhibits a depressed mood. He exhibits abnormal recent memory.     Assessment/Plan We are doing ECT today and anticipate at least one more treatment on Wednesday after which we will reassess for the possibility of discontinuing treatment as he is showing no improvement.  Herbert RasmussenJohn Latifah Padin, MD 08/03/2017, 10:30 AM

## 2017-08-03 NOTE — Procedures (Signed)
ECT SERVICES Physician's Interval Evaluation & Treatment Note  Patient Identification: Herbert Bryant MRN:  [id number] Date of Evaluation:  08/03/2017 TX #: 5  MADRS:   MMSE:   P.E. Findings:  No Change to physical exam  Psychiatric Interval Note:  Mood is self-reported as still being very depressed  Subjective:  Patient is a 37 y.o. male seen for evaluation for Electroconvulsive Therapy. Not feeling any better  Treatment Summary:   []   Right Unilateral             [x]  Bilateral   % Energy : 1.0 ms 25%   Impedance: 1860 ohms  Seizure Energy Index: 16,177 V squared  Postictal Suppression Index: 91%  Seizure Concordance Index: 98%  Medications  Pre Shock: Herbert Bryant 0.1 mg Toradol 30 mg Brevital 120 mg succinylcholine 150 mg  Post Shock: Versed 8 mg Haldol 5 mg Geodon 20 mg IM  Seizure Duration: 38 seconds by EMG 72 seconds by EEG   Comments: Follow-up on Wednesday  Lungs:  [x]   Clear to auscultation               []  Other:   Heart:    [x]   Regular rhythm             []  irregular rhythm    [x]   Previous H&P reviewed, patient examined and there are NO CHANGES                 []   Previous H&P reviewed, patient examined and there are changes noted.   Herbert RasmussenJohn Clapacs, MD 1/21/201910:32 AM

## 2017-08-04 DIAGNOSIS — S01552S Open bite of oral cavity, sequela: Secondary | ICD-10-CM | POA: Diagnosis not present

## 2017-08-05 ENCOUNTER — Other Ambulatory Visit: Payer: Self-pay | Admitting: Psychiatry

## 2017-08-05 ENCOUNTER — Encounter
Admission: RE | Admit: 2017-08-05 | Discharge: 2017-08-05 | Disposition: A | Discharge status: Home / Self Care | Payer: BLUE CROSS/BLUE SHIELD | Source: Ambulatory Visit | Attending: Psychiatry | Admitting: Psychiatry

## 2017-08-05 DIAGNOSIS — F338 Other recurrent depressive disorders: Secondary | ICD-10-CM | POA: Diagnosis not present

## 2017-08-05 DIAGNOSIS — R45851 Suicidal ideations: Secondary | ICD-10-CM | POA: Diagnosis not present

## 2017-08-05 DIAGNOSIS — F419 Anxiety disorder, unspecified: Secondary | ICD-10-CM | POA: Diagnosis not present

## 2017-08-05 DIAGNOSIS — F909 Attention-deficit hyperactivity disorder, unspecified type: Secondary | ICD-10-CM | POA: Diagnosis not present

## 2017-08-05 DIAGNOSIS — F319 Bipolar disorder, unspecified: Secondary | ICD-10-CM | POA: Diagnosis not present

## 2017-08-05 DIAGNOSIS — F332 Major depressive disorder, recurrent severe without psychotic features: Secondary | ICD-10-CM | POA: Diagnosis not present

## 2017-08-05 DIAGNOSIS — K219 Gastro-esophageal reflux disease without esophagitis: Secondary | ICD-10-CM | POA: Diagnosis not present

## 2017-08-05 DIAGNOSIS — K589 Irritable bowel syndrome without diarrhea: Secondary | ICD-10-CM | POA: Diagnosis not present

## 2017-08-05 MED ORDER — KETOROLAC TROMETHAMINE 30 MG/ML IJ SOLN: 30.0000 mg | Freq: Once | INTRAMUSCULAR | Status: DC

## 2017-08-05 MED ORDER — MIDAZOLAM HCL 2 MG/2ML IJ SOLN: 8.0000 mg | Freq: Once | INTRAMUSCULAR | Status: DC

## 2017-08-05 MED ORDER — SODIUM CHLORIDE 0.9 % IV SOLN: 500.0000 mL | Freq: Once | INTRAVENOUS | Status: DC

## 2017-08-05 MED ORDER — ONDANSETRON HCL 4 MG/2ML IJ SOLN: 4.0000 mg | Freq: Once | INTRAMUSCULAR | Status: DC

## 2017-08-05 MED ORDER — GLYCOPYRROLATE 0.2 MG/ML IJ SOLN: 0.1000 mg | Freq: Once | INTRAMUSCULAR | Status: DC

## 2017-08-05 NOTE — Progress Notes (Signed)
Patient consulted with Dr. Toni Amendlapacs and they have decided to discontinue his ECT treatments.

## 2017-08-10 DIAGNOSIS — F341 Dysthymic disorder: Secondary | ICD-10-CM | POA: Diagnosis not present

## 2017-09-07 DIAGNOSIS — H5310 Unspecified subjective visual disturbances: Secondary | ICD-10-CM | POA: Diagnosis not present

## 2017-09-18 DIAGNOSIS — H5310 Unspecified subjective visual disturbances: Secondary | ICD-10-CM | POA: Diagnosis not present

## 2017-09-21 DIAGNOSIS — F900 Attention-deficit hyperactivity disorder, predominantly inattentive type: Secondary | ICD-10-CM | POA: Diagnosis not present

## 2017-09-21 DIAGNOSIS — F3131 Bipolar disorder, current episode depressed, mild: Secondary | ICD-10-CM | POA: Diagnosis not present

## 2017-09-21 DIAGNOSIS — G4709 Other insomnia: Secondary | ICD-10-CM | POA: Diagnosis not present

## 2017-10-06 DIAGNOSIS — H5310 Unspecified subjective visual disturbances: Secondary | ICD-10-CM | POA: Diagnosis not present

## 2017-12-09 ENCOUNTER — Telehealth: Payer: Self-pay | Admitting: Diagnostic Neuroimaging

## 2017-12-09 ENCOUNTER — Encounter

## 2017-12-09 ENCOUNTER — Encounter: Payer: Self-pay | Admitting: Diagnostic Neuroimaging

## 2017-12-09 ENCOUNTER — Ambulatory Visit: Payer: BLUE CROSS/BLUE SHIELD | Admitting: Diagnostic Neuroimaging

## 2017-12-09 VITALS — BP 148/86 | HR 70 | Ht 70.0 in | Wt 318.2 lb

## 2017-12-09 DIAGNOSIS — R29818 Other symptoms and signs involving the nervous system: Secondary | ICD-10-CM

## 2017-12-09 DIAGNOSIS — G4733 Obstructive sleep apnea (adult) (pediatric): Secondary | ICD-10-CM

## 2017-12-09 NOTE — Telephone Encounter (Signed)
Patient states he will call us to schedule 4 month f/u per Dr. Marjory Lies.

## 2017-12-09 NOTE — Progress Notes (Signed)
[facility]  PATIENT: Herbert Bryant DOB: 1980-09-27  REFERRING CLINICIAN: Janace Hoard HISTORY FROM: patient  REASON FOR VISIT: new consult    HISTORICAL  CHIEF COMPLAINT:  Chief Complaint  Patient presents with  . NP  Dr. Evelene Croon    Rm 7, Alone  . body pulsations    Started back in 09/2017, last episode was 2 wks ago.      HISTORY OF PRESENT ILLNESS:   37 year old male here for evaluation of vision change, pulsing sensation, memory lapse.  In 2014 patient developed an abnormal distortion in his left eye as though he was looking through broken glass through the center of his vision.  This is persisted up until the present time.  Several months ago he had a new problem with his left eye in which she was unable to clearly focus on specific objects.  Patient went to several eye doctors who ruled out any specific ocular pathologies.  From March to April 2019 patient has had intermittent "pulsing disorientation" sensations from his shoulders, neck, face and head.  These would last a few seconds at a time.  He may have one episode per day up to 5 to 8/h.  No specific triggering or aggravating factors.  Symptoms have subsided in the last 2 weeks.  Patient has also had increasing short-term memory loss episodes since ECT therapy in January 2019.   REVIEW OF SYSTEMS: Full 14 system review of systems performed and negative with exception of: Depression, anxiety, attention deficit disorder, memory loss, confusion, insomnia snoring depression anxiety racing thoughts allergies urination problems swelling in legs fatigue blurred vision moles.  ALLERGIES: No Known Allergies  HOME MEDICATIONS: Outpatient Medications Prior to Visit  Medication Sig Dispense Refill  . Acetylcysteine (NAC PO) Take by mouth.    . ALPRAZolam (XANAX) 0.5 MG tablet Take 0.25-0.5 mg by mouth 3 (three) times daily as needed. Anxiety     . calcium citrate-vitamin D (CITRACAL+D) 315-200 MG-UNIT tablet  Take 1 tablet by mouth daily.     . cetirizine (ZYRTEC) 10 MG tablet Take 10 mg by mouth daily.    . finasteride (PROPECIA) 1 MG tablet One quarter tablet daily 30 tablet 5  . fish oil-omega-3 fatty acids 1000 MG capsule Take 1 g by mouth daily.     Marland Kitchen lamoTRIgine (LAMICTAL) 200 MG tablet Take 100 mg by mouth daily.     Marland Kitchen omeprazole (PRILOSEC) 20 MG capsule Take 20 mg by mouth daily.    . ondansetron (ZOFRAN) 4 MG tablet Take 4 mg by mouth every 8 (eight) hours as needed for nausea or vomiting.    Marland Kitchen OVER THE COUNTER MEDICATION niagen booster takes on awakening prn    . oxymetazoline (AFRIN) 0.05 % nasal spray Place 1 spray into both nostrils 3 (three) times daily.    . Tasimelteon (HETLIOZ) 20 MG CAPS Take by mouth at bedtime.     . Triamcinolone Acetonide (NASACORT AQ NA) Place into the nose.    . Armodafinil 200 MG TABS Take by mouth. Take by mouth daily.    Marland Kitchen guanFACINE (TENEX) 1 MG tablet Take 1 mg by mouth at bedtime.    . LamoTRIgine 50 MG TBDP Take 200 mg by mouth daily.      Marland Kitchen lithium carbonate (ESKALITH) 450 MG CR tablet TAKE 1 TABLET BY MOUTH EVERY EVENING X 7DAYS,THEN 2 TABLETS BY MOUTH EVERY EVENING  2  . Lurasidone HCl (LATUDA PO) Take by mouth.    . OLANZapine-FLUoxetine (  SYMBYAX) 6-25 MG capsule 1 capsule at bedtime.   4  . VYVANSE 70 MG capsule Take 70 mg by mouth daily.  0   No facility-administered medications prior to visit.     PAST MEDICAL HISTORY: Past Medical History:  Diagnosis Date  . Acne   . ADD (attention deficit disorder)   . ADHD (attention deficit hyperactivity disorder)   . Allergy   . Anxiety   . Bipolar 1 disorder (HCC)   . Depression   . GERD (gastroesophageal reflux disease)   . IBS (irritable bowel syndrome)     PAST SURGICAL HISTORY: Past Surgical History:  Procedure Laterality Date  . MOUTH SURGERY  1993   skin graft  . WISDOM TOOTH EXTRACTION      FAMILY HISTORY: Family History  Problem Relation Age of Onset  . Arthritis Maternal  Grandmother   . Stroke Maternal Grandmother   . Hypertension Maternal Grandmother   . Arthritis Maternal Grandfather   . Emphysema Paternal Grandfather   . Alcohol abuse Mother   . Depression Mother     SOCIAL HISTORY:  Social History   Socioeconomic History  . Marital status: Single    Spouse name: Not on file  . Number of children: 0  . Years of education: Not on file  . Highest education level: Bachelor's degree (e.g., BA, AB, BS)  Occupational History  . Not on file  Social Needs  . Financial resource strain: Not hard at all  . Food insecurity:    Worry: Never true    Inability: Never true  . Transportation needs:    Medical: No    Non-medical: No  Tobacco Use  . Smoking status: Former Smoker    Packs/day: 0.50    Types: Cigarettes    Last attempt to quit: 06/25/2012    Years since quitting: 5.4  . Smokeless tobacco: Never Used  Substance and Sexual Activity  . Alcohol use: No    Alcohol/week: 0.0 oz    Frequency: Never    Comment: occassional  . Drug use: Yes    Types: Marijuana    Comment: use on daily basics  . Sexual activity: Yes    Birth control/protection: None  Lifestyle  . Physical activity:    Days per week: Not on file    Minutes per session: Not on file  . Stress: To some extent  Relationships  . Social connections:    Talks on phone: Twice a week    Gets together: Twice a week    Attends religious service: Never    Active member of club or organization: Yes    Attends meetings of clubs or organizations: More than 4 times per year    Relationship status: Never married  . Intimate partner violence:    Fear of current or ex partner: No    Emotionally abused: No    Physically abused: No    Forced sexual activity: No  Other Topics Concern  . Not on file  Social History Narrative  . Not on file     PHYSICAL EXAM  GENERAL EXAM/CONSTITUTIONAL: Vitals:  Vitals:   12/09/17 0818  BP: (!) 154/104  Pulse: 83  Weight: (!) 318 lb 3.2 oz  (144.3 kg)  Height:  (1.778 m)     Body mass index is 45.66 kg/m.  Visual Acuity Screening   Right eye Left eye Both eyes  Without correction: 20/20 20/40   With correction:        Patient  is in no distress; well developed, nourished and groomed; neck is supple  CARDIOVASCULAR:  Examination of carotid arteries is normal; no carotid bruits  Regular rate and rhythm, no murmurs  Examination of peripheral vascular system by observation and palpation is normal  EYES:  Ophthalmoscopic exam of optic discs and posterior segments is normal; no papilledema or hemorrhages  MUSCULOSKELETAL:  Gait, strength, tone, movements noted in Neurologic exam below  NEUROLOGIC: MENTAL STATUS:  No flowsheet data found.  awake, alert, oriented to person, place and time  recent and remote memory intact  normal attention and concentration  language fluent, comprehension intact, naming intact,   fund of knowledge appropriate  CRANIAL NERVE:   2nd - no papilledema on fundoscopic exam  2nd, 3rd, 4th, 6th - pupils equal and reactive to light, visual fields full to confrontation, extraocular muscles intact, no nystagmus; SUBJECTIVE DISTORTION IN CENTER OF LEFT EYE VISUAL FIELD  5th - facial sensation symmetric  7th - facial strength symmetric  8th - hearing intact  9th - palate elevates symmetrically, uvula midline  11th - shoulder shrug symmetric  12th - tongue protrusion midline  MOTOR:   normal bulk and tone, full strength in the BUE, BLE  SENSORY:   normal and symmetric to light touch, temperature, vibration  COORDINATION:   finger-nose-finger, fine finger movements normal  REFLEXES:   deep tendon reflexes present and symmetric  GAIT/STATION:   narrow based gait; romberg is negative    DIAGNOSTIC DATA (LABS, IMAGING, TESTING) - I reviewed patient records, labs, notes, testing and imaging myself where available.  Lab Results  Component Value Date    WBC 8.4 07/22/2017   HGB 13.1 07/22/2017   HCT 39.5 (L) 07/22/2017   MCV 87.7 07/22/2017   PLT 164 07/22/2017      Component Value Date/Time   NA 140 07/22/2017 1335   K 3.4 (L) 07/22/2017 1335   CL 107 07/22/2017 1335   CO2 22 07/22/2017 1335   GLUCOSE 103 (H) 07/22/2017 1335   BUN 11 07/22/2017 1335   CREATININE 0.83 07/22/2017 1335   CREATININE 1.04 01/02/2017 0928   CALCIUM 7.9 (L) 07/22/2017 1335   PROT 7.0 01/02/2017 0928   ALBUMIN 4.0 01/02/2017 0928   AST 15 01/02/2017 0928   ALT 23 01/02/2017 0928   ALKPHOS 103 01/02/2017 0928   BILITOT 0.2 01/02/2017 0928   GFRNONAA >60 07/22/2017 1335   GFRAA >60 07/22/2017 1335   Lab Results  Component Value Date   CHOL 238 (H) 01/02/2017   HDL 29 (L) 01/02/2017   LDLCALC 162 (H) 01/02/2017   TRIG 236 (H) 01/02/2017   CHOLHDL 8.2 (H) 01/02/2017   No results found for: HGBA1C Lab Results  Component Value Date   VITAMINB12 365 01/02/2017   Lab Results  Component Value Date   TSH 2.24 01/02/2017    06/17/10 CT head  - normal    ASSESSMENT AND PLAN  37 y.o. year old male here with combination of neurologic symptoms:    Ddx: CNS autoimmune, inflamm, vascular, migraine, sleep apnea, stress reaction  1. Other symptoms and signs involving the nervous system      PLAN:  VISION DISTORTION / PULSE SENSATION / MEMORY LAPSE - MRI brain / orbits - lab testing   SLEEP APNEA (intolerant of CPAP; last PSG in 2015) - sleep study consult  HYPERTENSION / HYPERLIPIDEMIA - follow up with PCP  Orders Placed This Encounter  Procedures  . MR BRAIN W WO CONTRAST  . MR  ORBITS W WO CONTRAST  . Hemoglobin A1c  . TSH  . Vitamin B12  . Ambulatory referral to Sleep Studies   Return in about 4 months (around 04/11/2018).    Suanne Marker, MD 12/09/2017, 8:56 AM Certified in Neurology, Neurophysiology and Neuroimaging  [facility] [address], [address] [place], [place] [address] [phone]

## 2017-12-09 NOTE — Patient Instructions (Signed)
VISION DISTORTION / PULSE SENSATION / MEMORY LAPSE - MRI brain / orbits - lab testing   SLEEP APNEA - sleep study consult  HYPERTENSION / HYPERLIPIDEMIA - follow up with PCP

## 2017-12-10 ENCOUNTER — Telehealth: Payer: Self-pay | Admitting: *Deleted

## 2017-12-10 LAB — HEMOGLOBIN A1C
ESTIMATED AVERAGE GLUCOSE: 108 mg/dL
Hgb A1c MFr Bld: 5.4 % (ref 4.8–5.6)

## 2017-12-10 LAB — TSH: TSH: 3.35 u[IU]/mL (ref 0.450–4.500)

## 2017-12-10 LAB — VITAMIN B12: Vitamin B-12: 342 pg/mL (ref 232–1245)

## 2017-12-10 NOTE — Telephone Encounter (Signed)
Spoke with patient and informed him his labs are normal. He verbalized understanding, appreciation.

## 2017-12-14 ENCOUNTER — Telehealth: Payer: Self-pay | Admitting: Diagnostic Neuroimaging

## 2017-12-14 NOTE — Telephone Encounter (Signed)
MR Brain w/wo contrast & MR Orbits w/wo contrast Dr. Theresa MulliganPenumalli BC[employer] Auth: [id number] (exp. 12/14/17 to 01/12/18). [facility],.

## 2017-12-15 ENCOUNTER — Telehealth: Payer: Self-pay | Admitting: Diagnostic Neuroimaging

## 2017-12-15 NOTE — Telephone Encounter (Signed)
Patient has MRI scheduled tomorrow at 7am and would like to change to later in the day.

## 2017-12-15 NOTE — Telephone Encounter (Signed)
I spoke to the patient and scheduled him for a later time for tomorrow for his MRI.

## 2017-12-16 ENCOUNTER — Other Ambulatory Visit: Payer: BLUE CROSS/BLUE SHIELD

## 2017-12-16 ENCOUNTER — Ambulatory Visit (INDEPENDENT_AMBULATORY_CARE_PROVIDER_SITE_OTHER): Payer: BLUE CROSS/BLUE SHIELD

## 2017-12-16 DIAGNOSIS — R29818 Other symptoms and signs involving the nervous system: Secondary | ICD-10-CM | POA: Diagnosis not present

## 2017-12-16 MED ORDER — GADOPENTETATE DIMEGLUMINE 469.01 MG/ML IV SOLN
20.0000 mL | Freq: Once | INTRAVENOUS | Status: AC | PRN
  Administered 2017-12-16: 20 mL via INTRAVENOUS

## 2017-12-17 ENCOUNTER — Telehealth: Payer: Self-pay | Admitting: *Deleted

## 2017-12-17 NOTE — Telephone Encounter (Signed)
-----   Message from Suanne MarkerVikram R Penumalli, MD sent at 12/17/2017  3:26 PM EDT ----- Unremarkable imaging results. Please call patient. Continue current plan. -VRP

## 2017-12-17 NOTE — Telephone Encounter (Signed)
LVM informing patient his MRI brain and MRI orbits were unremarkable. Advised he continue with sleep study and FU with PCP re: HTN, hyperlipidemia. Left number for any questions.

## 2017-12-18 ENCOUNTER — Telehealth: Payer: Self-pay | Admitting: Internal Medicine

## 2017-12-18 NOTE — Telephone Encounter (Signed)
Copied from CRM [phone]. Topic: General - Other >> Dec 18, 2017  8:10 AM Gerrianne ScalePayne, Angela L wrote: Reason for CRM: pt states that Dr Levada DyPennumalli neurologist  and Dr Lafayette Dragonarr referred pt to see Dr Posey ReaPlotnikov as a primary Doctor  please call pt back at [phone] if willing to see pt

## 2017-12-18 NOTE — Telephone Encounter (Signed)
He has a PCP already - Dr Susann GivensLalonde Thx

## 2017-12-18 NOTE — Telephone Encounter (Signed)
Would you be willing to see this patient to establish care? Patient has been referred by Dr Geraldine ContrasPennumali and Dr Lafayette Dragonarr. Please advise.

## 2017-12-21 NOTE — Telephone Encounter (Signed)
Confirmed with patient that he does currently see Dr Susann GivensLalonde but would like to transfer his care to you if possible.

## 2017-12-24 NOTE — Telephone Encounter (Signed)
Appointment schedule. New Patient Paperwork mailed.

## 2017-12-24 NOTE — Telephone Encounter (Signed)
Ok Thx 

## 2018-01-22 ENCOUNTER — Other Ambulatory Visit (INDEPENDENT_AMBULATORY_CARE_PROVIDER_SITE_OTHER): Payer: BLUE CROSS/BLUE SHIELD

## 2018-01-22 ENCOUNTER — Encounter: Payer: Self-pay | Admitting: Internal Medicine

## 2018-01-22 ENCOUNTER — Ambulatory Visit: Payer: BLUE CROSS/BLUE SHIELD | Admitting: Internal Medicine

## 2018-01-22 VITALS — BP 142/78 | HR 88 | Temp 98.8°F | Ht 70.0 in | Wt 315.0 lb

## 2018-01-22 DIAGNOSIS — L649 Androgenic alopecia, unspecified: Secondary | ICD-10-CM | POA: Diagnosis not present

## 2018-01-22 DIAGNOSIS — F05 Delirium due to known physiological condition: Secondary | ICD-10-CM

## 2018-01-22 DIAGNOSIS — G479 Sleep disorder, unspecified: Secondary | ICD-10-CM

## 2018-01-22 DIAGNOSIS — B353 Tinea pedis: Secondary | ICD-10-CM | POA: Diagnosis not present

## 2018-01-22 DIAGNOSIS — J309 Allergic rhinitis, unspecified: Secondary | ICD-10-CM

## 2018-01-22 DIAGNOSIS — F1220 Cannabis dependence, uncomplicated: Secondary | ICD-10-CM | POA: Diagnosis not present

## 2018-01-22 DIAGNOSIS — R609 Edema, unspecified: Secondary | ICD-10-CM | POA: Diagnosis not present

## 2018-01-22 DIAGNOSIS — R21 Rash and other nonspecific skin eruption: Secondary | ICD-10-CM

## 2018-01-22 DIAGNOSIS — F319 Bipolar disorder, unspecified: Secondary | ICD-10-CM

## 2018-01-22 DIAGNOSIS — Z23 Encounter for immunization: Secondary | ICD-10-CM | POA: Diagnosis not present

## 2018-01-22 LAB — HEPATIC FUNCTION PANEL
ALT: 21 U/L (ref 0–53)
AST: 15 U/L (ref 0–37)
Albumin: 4.1 g/dL (ref 3.5–5.2)
Alkaline Phosphatase: 85 U/L (ref 39–117)
BILIRUBIN DIRECT: 0 mg/dL (ref 0.0–0.3)
TOTAL PROTEIN: 7.2 g/dL (ref 6.0–8.3)
Total Bilirubin: 0.3 mg/dL (ref 0.2–1.2)

## 2018-01-22 LAB — BASIC METABOLIC PANEL
BUN: 12 mg/dL (ref 6–23)
CHLORIDE: 105 meq/L (ref 96–112)
CO2: 27 mEq/L (ref 19–32)
Calcium: 8.7 mg/dL (ref 8.4–10.5)
Creatinine, Ser: 0.96 mg/dL (ref 0.40–1.50)
GFR: 93.55 mL/min (ref >60.00)
Glucose, Bld: 103 mg/dL — ABNORMAL HIGH (ref 70–99)
POTASSIUM: 3.7 meq/L (ref 3.5–5.1)
Sodium: 139 mEq/L (ref 135–145)

## 2018-01-22 LAB — URINALYSIS
BILIRUBIN URINE: NEGATIVE
HGB URINE DIPSTICK: NEGATIVE
Ketones, ur: NEGATIVE
Leukocytes, UA: NEGATIVE
Nitrite: NEGATIVE
Specific Gravity, Urine: 1.025 (ref 1.000–1.030)
Total Protein, Urine: NEGATIVE
UROBILINOGEN UA: 0.2 (ref 0.0–1.0)
Urine Glucose: NEGATIVE
pH: 6 (ref 5.0–8.0)

## 2018-01-22 LAB — TSH: TSH: 3.51 u[IU]/mL (ref 0.35–4.50)

## 2018-01-22 LAB — TESTOSTERONE: TESTOSTERONE: 230.62 ng/dL — AB (ref 300.00–890.00)

## 2018-01-22 MED ORDER — KETOCONAZOLE 2 % EX CREA: 1.0000 "application " | TOPICAL_CREAM | Freq: Every day | CUTANEOUS | 1 refills | Status: DC

## 2018-01-22 MED ORDER — LOSARTAN POTASSIUM-HCTZ 100-25 MG PO TABS: 1.0000 | ORAL_TABLET | Freq: Every day | ORAL | 3 refills | Status: DC

## 2018-01-22 NOTE — Patient Instructions (Signed)

## 2018-01-22 NOTE — Assessment & Plan Note (Addendum)
[place] -HCTZ NAS diet Labs Wt loss

## 2018-01-22 NOTE — Progress Notes (Signed)
Subjective:  Patient ID: Herbert Bryant, male    DOB: 09/18/1980  Age: 37 y.o. MRN: [id number]  CC: No chief complaint on file.   HPI Herbert Bryant presents for a new pt visit C/o L eye will not "autofocus" x months seeing Neurology, Ophthalmology S/p ECT in Jan 2019 w/complication - memory loss C/o bipolar depression, ADD, sleep disorder -  Dr Evelene Croon. He is a Higher education careers adviser C/o OSA   Outpatient Medications Prior to Visit  Medication Sig Dispense Refill  . Acetylcysteine (NAC PO) Take by mouth.    . ALPRAZolam (XANAX) 0.5 MG tablet Take 0.25-0.5 mg by mouth 3 (three) times daily as needed. Anxiety     . calcium citrate-vitamin D (CITRACAL+D) 315-200 MG-UNIT tablet Take 1 tablet by mouth daily.     . cetirizine (ZYRTEC) 10 MG tablet Take 10 mg by mouth daily.    . finasteride (PROPECIA) 1 MG tablet One quarter tablet daily 30 tablet 5  . fish oil-omega-3 fatty acids 1000 MG capsule Take 1 g by mouth daily.     Marland Kitchen lamoTRIgine (LAMICTAL) 200 MG tablet Take 100 mg by mouth daily.     Marland Kitchen omeprazole (PRILOSEC) 20 MG capsule Take 20 mg by mouth daily.    . ondansetron (ZOFRAN) 4 MG tablet Take 4 mg by mouth every 8 (eight) hours as needed for nausea or vomiting.    Marland Kitchen OVER THE COUNTER MEDICATION niagen booster takes on awakening prn    . oxymetazoline (AFRIN) 0.05 % nasal spray Place 1 spray into both nostrils 3 (three) times daily.    . Tasimelteon (HETLIOZ) 20 MG CAPS Take by mouth at bedtime.     . Triamcinolone Acetonide (NASACORT AQ NA) Place into the nose.    . Armodafinil 200 MG TABS Take by mouth. Take by mouth daily.    Marland Kitchen guanFACINE (TENEX) 1 MG tablet Take 1 mg by mouth at bedtime.     No facility-administered medications prior to visit.     ROS: Review of Systems  Constitutional: Positive for fatigue. Negative for appetite change and unexpected weight change.  HENT: Negative for congestion, nosebleeds, sneezing, sore throat and trouble swallowing.   Eyes: Positive for visual  disturbance. Negative for itching.  Respiratory: Negative for cough.   Cardiovascular: Negative for chest pain, palpitations and leg swelling.  Gastrointestinal: Negative for abdominal distention, blood in stool, diarrhea and nausea.  Genitourinary: Negative for frequency and hematuria.  Musculoskeletal: Negative for back pain, gait problem, joint swelling and neck pain.  Skin: Negative for rash.  Neurological: Negative for dizziness, tremors, speech difficulty and weakness.  Psychiatric/Behavioral: Positive for dysphoric mood and sleep disturbance. Negative for agitation and suicidal ideas. The patient is nervous/anxious.     Objective:  BP (!) 142/78 (BP Location: Left Arm, Patient Position: Sitting, Cuff Size: Large)   Pulse 88   Temp 98.8 F (37.1 C) (Oral)   Ht 5\' 10"  (1.778 m)   Wt (!) 315 lb (142.9 kg)   SpO2 98%   BMI 45.20 kg/m   BP Readings from Last 3 Encounters:  01/22/18 (!) 142/78  12/09/17 (!) 148/86  07/30/17 (!) 144/88    Wt Readings from Last 3 Encounters:  01/22/18 (!) 315 lb (142.9 kg)  12/09/17 (!) 318 lb 3.2 oz (144.3 kg)  07/30/17 (!) 319 lb 9.6 oz (145 kg)    Physical Exam  Constitutional: He is oriented to person, place, and time. He appears well-developed. No distress.  NAD  HENT:  Mouth/Throat: Oropharynx is clear and moist.  Eyes: Pupils are equal, round, and reactive to light. Conjunctivae are normal.  Neck: Normal range of motion. No JVD present. No thyromegaly present.  Cardiovascular: Normal rate, regular rhythm, normal heart sounds and intact distal pulses. Exam reveals no gallop and no friction rub.  No murmur heard. Pulmonary/Chest: Effort normal and breath sounds normal. No respiratory distress. He has no wheezes. He has no rales. He exhibits no tenderness.  Abdominal: Soft. Bowel sounds are normal. He exhibits no distension and no mass. There is no tenderness. There is no rebound and no guarding.  Musculoskeletal: Normal range of  motion. He exhibits edema. He exhibits no tenderness.  Lymphadenopathy:    He has no cervical adenopathy.  Neurological: He is alert and oriented to person, place, and time. He has normal reflexes. No cranial nerve deficit. He exhibits normal muscle tone. He displays a negative Romberg sign. Coordination and gait normal.  Skin: Skin is warm and dry. No rash noted.  Psychiatric: His behavior is normal. Judgment and thought content normal.  obese R foot rash Trace ankle edema  Lab Results  Component Value Date   WBC 8.4 07/22/2017   HGB 13.1 07/22/2017   HCT 39.5 (L) 07/22/2017   PLT 164 07/22/2017   GLUCOSE 103 (H) 07/22/2017   CHOL 238 (H) 01/02/2017   TRIG 236 (H) 01/02/2017   HDL 29 (L) 01/02/2017   LDLCALC 162 (H) 01/02/2017   ALT 23 01/02/2017   AST 15 01/02/2017   NA 140 07/22/2017   K 3.4 (L) 07/22/2017   CL 107 07/22/2017   CREATININE 0.83 07/22/2017   BUN 11 07/22/2017   CO2 22 07/22/2017   TSH 3.350 12/09/2017   HGBA1C 5.4 12/09/2017    No results found.  Assessment & Plan:   There are no diagnoses linked to this encounter.   No orders of the defined types were placed in this encounter.    Follow-up: No follow-ups on file.  Sonda PrimesAlex Plotnikov, MD

## 2018-01-22 NOTE — Assessment & Plan Note (Addendum)
Wt Readings from Last 3 Encounters:  01/22/18 (!) 315 lb (142.9 kg)  12/09/17 (!) 318 lb 3.2 oz (144.3 kg)  07/30/17 (!) 319 lb 9.6 oz (145 kg)   Chronic. BMI 45

## 2018-01-22 NOTE — Assessment & Plan Note (Signed)
Dr Kaur 

## 2018-01-22 NOTE — Assessment & Plan Note (Addendum)
Ketocoazole cream

## 2018-01-22 NOTE — Assessment & Plan Note (Signed)
F/u w/Dr Kaur ?

## 2018-01-25 ENCOUNTER — Telehealth: Payer: Self-pay | Admitting: Internal Medicine

## 2018-01-25 DIAGNOSIS — F1220 Cannabis dependence, uncomplicated: Secondary | ICD-10-CM | POA: Insufficient documentation

## 2018-01-25 DIAGNOSIS — B353 Tinea pedis: Secondary | ICD-10-CM | POA: Insufficient documentation

## 2018-01-25 DIAGNOSIS — G479 Sleep disorder, unspecified: Secondary | ICD-10-CM | POA: Insufficient documentation

## 2018-01-25 LAB — VITAMIN B12: Vitamin B-12: 271 pg/mL (ref 211–911)

## 2018-01-25 NOTE — Assessment & Plan Note (Signed)
claritin

## 2018-01-25 NOTE — Assessment & Plan Note (Addendum)
Circadian rhythm reversal On OSA

## 2018-01-25 NOTE — Assessment & Plan Note (Signed)
propecia

## 2018-01-25 NOTE — Telephone Encounter (Signed)
Please have Dr. Posey ReaPlotnikov review Hyzaar dosage note from pharmacy and [facility] Thank you.

## 2018-01-25 NOTE — Telephone Encounter (Signed)
Copied from CRM #[id number]. Topic: Quick Communication - Rx Refill/Question >> Jan 25, 2018  9:55 AM Maia Pettiesrtiz, Kristie S wrote: Medication: losartan-hydrochlorothiazide (HYZAAR) 100-25 MG tablet - pt said pharmacy told him this was a high dose and advised him to double check with provider - please call pt

## 2018-01-25 NOTE — Assessment & Plan Note (Signed)
The pt is planning to cut back

## 2018-01-25 NOTE — Telephone Encounter (Signed)
LM notifying it is a standard dose but to call back with questions.  Pt already picked up medication

## 2018-01-25 NOTE — Assessment & Plan Note (Signed)
Ketoconazole rx

## 2018-01-26 ENCOUNTER — Other Ambulatory Visit: Payer: BLUE CROSS/BLUE SHIELD

## 2018-01-26 ENCOUNTER — Encounter: Payer: Self-pay | Admitting: Family

## 2018-01-26 ENCOUNTER — Other Ambulatory Visit: Payer: Self-pay | Admitting: Internal Medicine

## 2018-01-26 ENCOUNTER — Ambulatory Visit: Payer: BLUE CROSS/BLUE SHIELD | Admitting: Family

## 2018-01-26 VITALS — BP 142/80 | HR 77 | Temp 98.1°F | Ht 70.0 in | Wt 313.0 lb

## 2018-01-26 DIAGNOSIS — Z113 Encounter for screening for infections with a predominantly sexual mode of transmission: Secondary | ICD-10-CM

## 2018-01-26 DIAGNOSIS — R350 Frequency of micturition: Secondary | ICD-10-CM

## 2018-01-26 MED ORDER — VITAMIN B-12 500 MCG SL SUBL: 1.0000 | SUBLINGUAL_TABLET | SUBLINGUAL | 3 refills | Status: AC

## 2018-01-26 NOTE — Progress Notes (Signed)
Herbert Bryant is a 37 y.o. male with the following history as recorded in EpicCare:  Patient Active Problem List   Diagnosis Date Noted  . Moderate tetrahydrocannabinol (THC) dependence (HCC) 01/25/2018  . Sleep disorder 01/25/2018  . Tinea pedis 01/25/2018  . Edema 01/22/2018  . Bipolar depression (HCC) 01/22/2018  . Rash 01/22/2018  . Delirium of mixed origin 07/22/2017  . Acute cystitis with positive culture 01/05/2017  . 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 OSA (obstructive sleep apnea) 03/24/2014  . Morbid obesity (HCC) 12/14/2013  . Allergic rhinitis 12/14/2013    Current Outpatient Medications  Medication Sig Dispense Refill  . Acetylcysteine (NAC PO) Take by mouth.    . ALPRAZolam (XANAX) 0.5 MG tablet Take 0.25-0.5 mg by mouth 3 (three) times daily as needed. Anxiety     . Armodafinil 200 MG TABS Take by mouth. Take by mouth daily.    . cetirizine (ZYRTEC) 10 MG tablet Take 10 mg by mouth daily.    . finasteride (PROPECIA) 1 MG tablet One quarter tablet daily 30 tablet 5  . fish oil-omega-3 fatty acids 1000 MG capsule Take 1 g by mouth daily.     Marland Kitchen guanFACINE (TENEX) 1 MG tablet Take 1 mg by mouth at bedtime.    Marland Kitchen ketoconazole (NIZORAL) 2 % cream Apply 1 application topically daily. 45 g 1  . lamoTRIgine (LAMICTAL) 200 MG tablet Take 100 mg by mouth daily.     Marland Kitchen losartan-hydrochlorothiazide (HYZAAR) 100-25 MG tablet Take 1 tablet by mouth daily. 90 tablet 3  . omeprazole (PRILOSEC) 20 MG capsule Take 20 mg by mouth daily.    Marland Kitchen OVER THE COUNTER MEDICATION niagen booster takes on awakening prn    . oxymetazoline (AFRIN) 0.05 % nasal spray Place 1 spray into both nostrils 3 (three) times daily.    . Tasimelteon (HETLIOZ) 20 MG CAPS Take by mouth at bedtime.     . Triamcinolone Acetonide (NASACORT AQ NA) Place into the nose.     No current facility-administered medications for this visit.     Allergies: Patient has no known allergies.  Past Medical History:  Diagnosis  Date  . Acne   . ADD (attention deficit disorder)   . ADHD (attention deficit hyperactivity disorder)   . Allergy   . Anxiety   . Bipolar 1 disorder (HCC)   . Depression   . GERD (gastroesophageal reflux disease)   . IBS (irritable bowel syndrome)     Past Surgical History:  Procedure Laterality Date  . MOUTH SURGERY  1993   skin graft  . WISDOM TOOTH EXTRACTION      Family History  Problem Relation Age of Onset  . Arthritis Maternal Grandmother   . Stroke Maternal Grandmother   . Hypertension Maternal Grandmother   . Alzheimer's disease Maternal Grandmother   . Arthritis Maternal Grandfather   . Alzheimer's disease Maternal Grandfather   . Emphysema Paternal Grandfather   . Alcohol abuse Mother   . Depression Mother   . Skin cancer Mother   . Skin cancer Father     Social History   Tobacco Use  . Smoking status: Former Smoker    Packs/day: 0.50    Types: Cigarettes    Last attempt to quit: 06/25/2012    Years since quitting: 5.5  . Smokeless tobacco: Never Used  Substance Use Topics  . Alcohol use: No    Alcohol/week: 0.0 oz    Frequency: Never    Comment: occassional  Subjective:  Patient is requesting STD screen today; was told by a previous partner that the patient had given the partner gonorrhea; patient feels that this is impossible as he has not been sexually active; does have urinary frequency but notes this has been present "for months" and denies any burning on urination or penile discharge; asking to get testing updated for reassurance purposes today;    Objective:  Vitals:   01/26/18 0815  BP: (!) 142/80  Pulse: 77  Temp: 98.1 F (36.7 C)  TempSrc: Oral  SpO2: 96%  Weight: (!) 313 lb (142 kg)  Height: 5\' 10"  (1.778 m)    General: Well developed, well nourished, in no acute distress  Skin : Warm and dry.  Head: Normocephalic and atraumatic  Lungs: Respirations unlabored; Neurologic: Alert and oriented; speech intact; face symmetrical; moves  all extremities well; CNII-XII intact without focal deficit  Assessment:  1. Screen for STD (sexually transmitted disease)   2. Urinary frequency     Plan:  Labs updated as requested; follow-up to be determined;   No follow-ups on file.  Orders Placed This Encounter  Procedures  . GC/Chlamydia Probe Amp([facility])    Standing Status:   Future    Number of Occurrences:   1    Standing Expiration Date:   01/27/2019  . Urine Culture    Standing Status:   Future    Number of Occurrences:   1    Standing Expiration Date:   01/26/2019  . HIV antibody    Standing Status:   Future    Number of Occurrences:   1    Standing Expiration Date:   01/27/2019  . RPR    Standing Status:   Future    Number of Occurrences:   1    Standing Expiration Date:   01/27/2019    Requested Prescriptions    No prescriptions requested or ordered in this encounter

## 2018-01-27 LAB — URINE CULTURE
MICRO NUMBER:: 90840612
SPECIMEN QUALITY: ADEQUATE

## 2018-01-27 LAB — GC/CHLAMYDIA PROBE AMP
Chlamydia trachomatis, NAA: NEGATIVE
Neisseria gonorrhoeae by PCR: NEGATIVE

## 2018-01-27 LAB — HIV ANTIBODY (ROUTINE TESTING W REFLEX): HIV 1&2 Ab, 4th Generation: NONREACTIVE

## 2018-01-27 LAB — RPR: RPR Ser Ql: NONREACTIVE

## 2018-02-01 ENCOUNTER — Ambulatory Visit: Payer: Self-pay | Admitting: Internal Medicine

## 2018-02-01 NOTE — Telephone Encounter (Signed)
Please advise 

## 2018-02-01 NOTE — Telephone Encounter (Signed)
   Reason for Disposition . Caller has NON-URGENT medication question about med that PCP prescribed and triager unable to answer question  Answer Assessment - Initial Assessment Questions 1. SYMPTOMS: "Do you have any symptoms?"     No symptoms today- But Friday and Saturday after taking the Losartan had extreme fatigue, chest pain with no radiation, stomach cramps. Friday was the first day he took the medication and took a dose Saturday and after sx came about he stopped taking the Losartan 2. SEVERITY: If symptoms are present, ask "Are they mild, moderate or severe?"     No sx are present at time of call  Protocols used: MEDICATION QUESTION CALL-A-AH

## 2018-02-02 NOTE — Telephone Encounter (Signed)
Pt notified, appt made

## 2018-02-02 NOTE — Telephone Encounter (Addendum)
pls stop Lisartan HCT rtc 1 wk Thx

## 2018-02-05 DIAGNOSIS — F3132 Bipolar disorder, current episode depressed, moderate: Secondary | ICD-10-CM | POA: Diagnosis not present

## 2018-02-09 ENCOUNTER — Institutional Professional Consult (permissible substitution): Payer: BLUE CROSS/BLUE SHIELD | Admitting: Neurology

## 2018-02-22 ENCOUNTER — Ambulatory Visit (INDEPENDENT_AMBULATORY_CARE_PROVIDER_SITE_OTHER): Payer: BLUE CROSS/BLUE SHIELD | Admitting: Internal Medicine

## 2018-02-22 ENCOUNTER — Encounter: Payer: Self-pay | Admitting: Internal Medicine

## 2018-02-22 ENCOUNTER — Encounter

## 2018-02-22 DIAGNOSIS — F319 Bipolar disorder, unspecified: Secondary | ICD-10-CM

## 2018-02-22 DIAGNOSIS — E291 Testicular hypofunction: Secondary | ICD-10-CM

## 2018-02-22 DIAGNOSIS — G4733 Obstructive sleep apnea (adult) (pediatric): Secondary | ICD-10-CM

## 2018-02-22 DIAGNOSIS — F1220 Cannabis dependence, uncomplicated: Secondary | ICD-10-CM

## 2018-02-22 DIAGNOSIS — I10 Essential (primary) hypertension: Secondary | ICD-10-CM

## 2018-02-22 DIAGNOSIS — I872 Venous insufficiency (chronic) (peripheral): Secondary | ICD-10-CM

## 2018-02-22 MED ORDER — TESTOSTERONE 20.25 MG/ACT (1.62%) TD GEL: 2.0000 | TRANSDERMAL | 5 refills | Status: DC

## 2018-02-22 MED ORDER — VASCULERA PO TABS: 1.0000 | ORAL_TABLET | Freq: Every day | ORAL | 3 refills | Status: DC

## 2018-02-22 NOTE — Assessment & Plan Note (Signed)
Options discussed - Androgel Rx  Potential benefits of a long term sex steroid  use as well as potential risks  and complications were explained to the patient and were aknowledged.

## 2018-02-22 NOTE — Assessment & Plan Note (Signed)
Testosterone may affect OSA - discussed

## 2018-02-22 NOTE — Assessment & Plan Note (Signed)
8/19 - quit

## 2018-02-22 NOTE — Patient Instructions (Signed)
   1. Lose Weight If you're overweight, shedding the excess pounds may increase your testosterone levels, according to multiple research. Overweight men are more likely to have low testosterone levels to begin with, so this is an important trick to increase your body's testosterone production when you need it most.   2. Strength Training    Strength training is also known to boost testosterone levels, provided you are doing so intensely enough. When strength training to boost testosterone, you'll want to increase the weight and lower your number of reps, and then focus on exercises that work a large number of muscles.  3. Optimize Your Vitamin D Levels Vitamin D, a steroid hormone, is essential for the healthy development of the nucleus of the sperm cell, and helps maintain semen quality and sperm count. Vitamin D also increases levels of testosterone, which may boost libido. In one study, overweight men who were given vitamin D supplements had a significant increase in testosterone levels after one year.  4. Reduce Stress When you're under a lot of stress, your body releases high levels of the stress hormone cortisol. This hormone actually blocks the effects of testosterone, presumably because, from a biological standpoint, testosterone-associated behaviors (mating, competing, aggression) may have lowered your chances of survival in an emergency (hence, the "fight or flight" response is dominant, courtesy of cortisol).  5. Limit or Eliminate Sugar from Your Diet Testosterone levels decrease after you eat sugar, which is likely because the sugar leads to a high insulin level, another factor leading to low testosterone.  6. Eat Healthy Fats By healthy, this means not only mon- and polyunsaturated fats, like that found in avocadoes and nuts, but also saturated, as these are essential for building testosterone. Research shows that a diet with less than 40 percent of energy as fat (and that mainly from  animal sources, i.e. saturated) lead to a decrease in testosterone levels.  It's important to understand that your body requires saturated fats from animal and vegetable sources (such as meat, dairy, certain oils, and tropical plants like coconut) for optimal functioning, and if you neglect this important food group in favor of sugar, grains and other starchy carbs, your health and weight are almost guaranteed to suffer. Examples of healthy fats you can eat more of to give your testosterone levels a boost include:  Olives and Olive oil  Coconuts and coconut oil Butter made from organic milk  Raw nuts, such as, almonds or pecans Eggs Avocados   Meats Palm oil Unheated organic nut oils

## 2018-02-22 NOTE — Assessment & Plan Note (Signed)
Vasculera, Wt loss

## 2018-02-22 NOTE — Progress Notes (Signed)
Subjective:  Patient ID: Herbert Bryant Copen, male    DOB: September 27, 1980  Age: 37 y.Bryant. MRN: [id number]  CC: No chief complaint on file.   HPI Herbert Bryant Rufus presents for HTN Losartan gave side effects Stopped smoking pot and stopped drinking gaitoraid 3 wks ago The pt wants    Outpatient Medications Prior to Visit  Medication Sig Dispense Refill  . Acetylcysteine (NAC PO) Take by mouth.    . ALPRAZolam (XANAX) 0.5 MG tablet Take 0.25-0.5 mg by mouth 3 (three) times daily as needed. Anxiety     . Armodafinil 200 MG TABS Take by mouth. Take by mouth daily.    . cariprazine (VRAYLAR) capsule Take 3 mg by mouth.    . cetirizine (ZYRTEC) 10 MG tablet Take 10 mg by mouth daily.    . Cyanocobalamin (VITAMIN B-12) 500 MCG SUBL Place 1 tablet (500 mcg total) under the tongue 1 day or 1 dose. 100 tablet 3  . finasteride (PROPECIA) 1 MG tablet One quarter tablet daily 30 tablet 5  . fish oil-omega-3 fatty acids 1000 MG capsule Take 1 g by mouth daily.     Marland Kitchen. guanFACINE (TENEX) 1 MG tablet Take 1 mg by mouth at bedtime.    Marland Kitchen. ketoconazole (NIZORAL) 2 % cream Apply 1 application topically daily. 45 g 1  . lamoTRIgine (LAMICTAL) 200 MG tablet Take 100 mg by mouth daily.     Marland Kitchen. omeprazole (PRILOSEC) 20 MG capsule Take 20 mg by mouth daily.    Marland Kitchen. OVER THE COUNTER MEDICATION niagen booster takes on awakening prn    . oxymetazoline (AFRIN) 0.05 % nasal spray Place 1 spray into both nostrils 3 (three) times daily.    . Tasimelteon (HETLIOZ) 20 MG CAPS Take by mouth at bedtime.     . Triamcinolone Acetonide (NASACORT AQ NA) Place into the nose.    . losartan-hydrochlorothiazide (HYZAAR) 100-25 MG tablet Take 1 tablet by mouth daily. (Patient not taking: Reported on 02/22/2018) 90 tablet 3   No facility-administered medications prior to visit.     ROS: Review of Systems  Constitutional: Positive for fatigue. Negative for appetite change and unexpected weight change.  HENT: Negative for congestion,  nosebleeds, sneezing, sore throat and trouble swallowing.   Eyes: Negative for itching and visual disturbance.  Respiratory: Negative for cough.   Cardiovascular: Negative for chest pain, palpitations and leg swelling.  Gastrointestinal: Negative for abdominal distention, blood in stool, diarrhea and nausea.  Genitourinary: Negative for frequency and hematuria.  Musculoskeletal: Negative for back pain, gait problem, joint swelling and neck pain.  Skin: Negative for rash.  Neurological: Negative for dizziness, tremors, speech difficulty and weakness.  Psychiatric/Behavioral: Positive for dysphoric mood and sleep disturbance. Negative for agitation and suicidal ideas. The patient is nervous/anxious.     Objective:  BP 126/74 (BP Location: Left Arm, Patient Position: Sitting, Cuff Size: Large)   Pulse 75   Temp 97.9 F (36.6 C) (Oral)   Ht 5\' 10"  (1.778 m)   Wt (!) 307 lb (139.3 kg)   SpO2 97%   BMI 44.05 kg/m   BP Readings from Last 3 Encounters:  02/22/18 126/74  01/26/18 (!) 142/80  01/22/18 (!) 142/78    Wt Readings from Last 3 Encounters:  02/22/18 (!) 307 lb (139.3 kg)  01/26/18 (!) 313 lb (142 kg)  01/22/18 (!) 315 lb (142.9 kg)    Physical Exam  Constitutional: He is oriented to person, place, and time. He appears well-developed. No distress.  NAD  HENT:  Mouth/Throat: Oropharynx is clear and moist.  Eyes: Pupils are equal, round, and reactive to light. Conjunctivae are normal.  Neck: Normal range of motion. No JVD present. No thyromegaly present.  Cardiovascular: Normal rate, regular rhythm, normal heart sounds and intact distal pulses. Exam reveals no gallop and no friction rub.  No murmur heard. Pulmonary/Chest: Effort normal and breath sounds normal. No respiratory distress. He has no wheezes. He has no rales. He exhibits no tenderness.  Abdominal: Soft. Bowel sounds are normal. He exhibits no distension and no mass. There is no tenderness. There is no rebound  and no guarding.  Musculoskeletal: Normal range of motion. He exhibits no edema or tenderness.  Lymphadenopathy:    He has no cervical adenopathy.  Neurological: He is alert and oriented to person, place, and time. He has normal reflexes. No cranial nerve deficit. He exhibits normal muscle tone. He displays a negative Romberg sign. Coordination and gait normal.  Skin: Skin is warm and dry. No rash noted.  Psychiatric: He has a normal mood and affect. His behavior is normal. Judgment and thought content normal.  obese  Lab Results  Component Value Date   WBC 8.4 07/22/2017   HGB 13.1 07/22/2017   HCT 39.5 (L) 07/22/2017   PLT 164 07/22/2017   GLUCOSE 103 (H) 01/22/2018   CHOL 238 (H) 01/02/2017   TRIG 236 (H) 01/02/2017   HDL 29 (L) 01/02/2017   LDLCALC 162 (H) 01/02/2017   ALT 21 01/22/2018   AST 15 01/22/2018   NA 139 01/22/2018   K 3.7 01/22/2018   CL 105 01/22/2018   CREATININE 0.96 01/22/2018   BUN 12 01/22/2018   CO2 27 01/22/2018   TSH 3.51 01/22/2018   HGBA1C 5.4 12/09/2017    No results found.  Assessment & Plan:   There are no diagnoses linked to this encounter.   No orders of the defined types were placed in this encounter.    Follow-up: No follow-ups on file.  Sonda PrimesAlex Jais Demir, MD

## 2018-02-22 NOTE — Assessment & Plan Note (Signed)
Diet/salt restriction controlled

## 2018-02-22 NOTE — Assessment & Plan Note (Signed)
Per Dr. Kaur 

## 2018-02-26 ENCOUNTER — Telehealth: Payer: Self-pay | Admitting: Internal Medicine

## 2018-02-26 NOTE — Telephone Encounter (Addendum)
  Copied from CRM [phone]. Topic: Quick Communication - See Telephone Encounter >> Feb 26, 2018 11:52 AM Arlyss Gandyichardson, Kydan Shanholtzer N, NT wrote: CRM for notification. See Telephone encounter for: 02/26/18. Pt states that the Testosterone (ANDROGEL PUMP) 20.25 MG/ACT (1.62%) GEL needs prior auth with his insurance and he is calling to check status on the prior auth.

## 2018-02-26 NOTE — Telephone Encounter (Signed)
Pa started yesterday. Key: [id number]

## 2018-03-09 DIAGNOSIS — D2230 Melanocytic nevi of unspecified part of face: Secondary | ICD-10-CM | POA: Diagnosis not present

## 2018-03-09 DIAGNOSIS — D239 Other benign neoplasm of skin, unspecified: Secondary | ICD-10-CM | POA: Diagnosis not present

## 2018-03-09 DIAGNOSIS — I872 Venous insufficiency (chronic) (peripheral): Secondary | ICD-10-CM | POA: Diagnosis not present

## 2018-03-09 DIAGNOSIS — D225 Melanocytic nevi of trunk: Secondary | ICD-10-CM | POA: Diagnosis not present

## 2018-03-09 DIAGNOSIS — D2239 Melanocytic nevi of other parts of face: Secondary | ICD-10-CM | POA: Diagnosis not present

## 2018-03-09 NOTE — Telephone Encounter (Signed)
PA denied.

## 2018-03-17 NOTE — Telephone Encounter (Signed)
Pt would like a call with what the next steps are since prior auth was denied.

## 2018-03-17 NOTE — Telephone Encounter (Signed)
Pt notified that he will need to come in for 2nd lab draw.

## 2018-03-18 ENCOUNTER — Other Ambulatory Visit (INDEPENDENT_AMBULATORY_CARE_PROVIDER_SITE_OTHER): Payer: BLUE CROSS/BLUE SHIELD

## 2018-03-18 DIAGNOSIS — E291 Testicular hypofunction: Secondary | ICD-10-CM | POA: Diagnosis not present

## 2018-03-18 LAB — CBC WITH DIFFERENTIAL/PLATELET
BASOS ABS: 0 10*3/uL (ref 0.0–0.1)
Basophils Relative: 0.4 % (ref 0.0–3.0)
Eosinophils Absolute: 0.1 10*3/uL (ref 0.0–0.7)
Eosinophils Relative: 1.5 % (ref 0.0–5.0)
HEMATOCRIT: 43.3 % (ref 39.0–52.0)
HEMOGLOBIN: 14.6 g/dL (ref 13.0–17.0)
LYMPHS PCT: 18.7 % (ref 12.0–46.0)
Lymphs Abs: 1.8 10*3/uL (ref 0.7–4.0)
MCHC: 33.8 g/dL (ref 30.0–36.0)
MCV: 84.8 fl (ref 78.0–100.0)
MONOS PCT: 6.8 % (ref 3.0–12.0)
Monocytes Absolute: 0.7 10*3/uL (ref 0.1–1.0)
Neutro Abs: 7 10*3/uL (ref 1.4–7.7)
Neutrophils Relative %: 72.6 % (ref 43.0–77.0)
Platelets: 200 10*3/uL (ref 150.0–400.0)
RBC: 5.11 Mil/uL (ref 4.22–5.81)
RDW: 14 % (ref 11.5–15.5)
WBC: 9.6 10*3/uL (ref 4.0–10.5)

## 2018-03-18 LAB — BASIC METABOLIC PANEL
BUN: 14 mg/dL (ref 6–23)
CHLORIDE: 103 meq/L (ref 96–112)
CO2: 29 meq/L (ref 19–32)
Calcium: 9.1 mg/dL (ref 8.4–10.5)
Creatinine, Ser: 0.92 mg/dL (ref 0.40–1.50)
GFR: 98.17 mL/min (ref >60.00)
Glucose, Bld: 87 mg/dL (ref 70–99)
Potassium: 3.8 mEq/L (ref 3.5–5.1)
SODIUM: 138 meq/L (ref 135–145)

## 2018-03-18 LAB — HEPATIC FUNCTION PANEL
ALBUMIN: 4.1 g/dL (ref 3.5–5.2)
ALK PHOS: 96 U/L (ref 39–117)
ALT: 15 U/L (ref 0–53)
AST: 11 U/L (ref 0–37)
Bilirubin, Direct: 0.1 mg/dL (ref 0.0–0.3)
Total Bilirubin: 0.6 mg/dL (ref 0.2–1.2)
Total Protein: 7.5 g/dL (ref 6.0–8.3)

## 2018-03-18 LAB — TESTOSTERONE: TESTOSTERONE: 203.54 ng/dL — AB (ref 300.00–890.00)

## 2018-03-23 ENCOUNTER — Encounter: Payer: Self-pay | Admitting: Neurology

## 2018-03-23 ENCOUNTER — Ambulatory Visit: Payer: BLUE CROSS/BLUE SHIELD | Admitting: Neurology

## 2018-03-23 VITALS — BP 149/95 | HR 81 | Ht 70.0 in | Wt 309.0 lb

## 2018-03-23 DIAGNOSIS — G4723 Circadian rhythm sleep disorder, irregular sleep wake type: Secondary | ICD-10-CM | POA: Diagnosis not present

## 2018-03-23 DIAGNOSIS — G4733 Obstructive sleep apnea (adult) (pediatric): Secondary | ICD-10-CM

## 2018-03-23 NOTE — Patient Instructions (Signed)
As discussed, we will reevaluate your sleep apnea with a home sleep test as an attended sleep study is not possible for you at this time.  If you have sleep apnea and do not wish to pursue CPAP therapy or autoPAP therapy, you may be a candidate for a dental device/oral appliance.

## 2018-03-23 NOTE — Progress Notes (Signed)
Subjective:    Patient ID: Herbert Bryant is a 37 y.o. male.  HPI     Herbert Foley, MD, PhD [facility] [address] [address] [place], [place] [address]  Dear Herbert Bryant,   I saw your patient, Herbert Bryant, upon your kind request in my clinic today for initial consultation of his sleep disorder, in particular, concern for underlying obstructive sleep apnea. The patient is unaccompanied today. As you know, Herbert Bryant is a 37 year old right-handed gentleman with an underlying medical history of mood disorder with diagnosis of bipolar disorder, anxiety, ADD, allergies, reflux disease, irritable bowel syndrome and morbid obesity with a BMI of over 40, who reports snoring and excessive daytime somnolence. His Epworth sleepiness score is 8-11 out of 24, fatigue score is 57 out of 63. I reviewed your office note from 12/09/2017. He was previously diagnosed with obstructive sleep apnea but is currently not on CPAP therapy. Sleep study testing in September 2015 showed an AHI of 16.5 per hour and he was noted to have loud snoring. He had a split-night sleep study, O2 nadir before treatment was 88%. He was treated with CPAP of 9 cm. he tried CPAP for maybe 2 or 3 weeks. He could not tolerate CPAP because he could not stand anything on his face, he could not try a nasal interface because he is a mouth breather and he could not tolerate the chin strap. He would be willing to get retested for sleep apnea but is not able to pursue CPAP therapy at this point. He does not have any set bedtime arise time schedule. He has a varying schedule day-to-day. He sees psychiatry for his bipolar disorder. He is no longer on Seroquel or Remeron. He had significant weight gain on these medicines. His weight has been stable since then. He is on Lamictal and Xanax. He never started the Nuvigil that he was prescribed. He reports that he cannot come in for sleep study because of his varying sleep  schedule and wake up time. He is not sure that he can tolerate home sleep testing equipment either. He's willing to try a home sleep test. He would be interested in pursuing an oral appliance but is not sure that he would tolerate an oral appliance as he had trouble with a retainer in the past, ended up with TMJ issues. He lives alone, he does not work, he has 1 cat. He quit smoking over 5 years ago when drinks alcohol rarely, once every 3-4 months or so and does not drink any caffeine.    His Past Medical History Is Significant For: Past Medical History:  Diagnosis Date  . Acne   . ADD (attention deficit disorder)   . ADHD (attention deficit hyperactivity disorder)   . Allergy   . Anxiety   . Bipolar 1 disorder (HCC)   . Depression   . GERD (gastroesophageal reflux disease)   . IBS (irritable bowel syndrome)     His Past Surgical History Is Significant For: Past Surgical History:  Procedure Laterality Date  . MOUTH SURGERY  1993   skin graft  . WISDOM TOOTH EXTRACTION      His Family History Is Significant For: Family History  Problem Relation Age of Onset  . Arthritis Maternal Grandmother   . Stroke Maternal Grandmother   . Hypertension Maternal Grandmother   . Alzheimer's disease Maternal Grandmother   . Arthritis Maternal Grandfather   . Alzheimer's disease Maternal Grandfather   . Emphysema  Paternal Grandfather   . Alcohol abuse Mother   . Depression Mother   . Skin cancer Mother   . Skin cancer Father     His Social History Is Significant For: Social History   Socioeconomic History  . Marital status: Single    Spouse name: Not on file  . Number of children: 0  . Years of education: Not on file  . Highest education level: Bachelor's degree (e.g., BA, AB, BS)  Occupational History  . Not on file  Social Needs  . Financial resource strain: Not hard at all  . Food insecurity:    Worry: Never true    Inability: Never true  . Transportation needs:    Medical:  No    Non-medical: No  Tobacco Use  . Smoking status: Former Smoker    Packs/day: 0.50    Types: Cigarettes    Last attempt to quit: 06/25/2012    Years since quitting: 5.7  . Smokeless tobacco: Never Used  Substance and Sexual Activity  . Alcohol use: No    Alcohol/week: 0.0 standard drinks    Frequency: Never    Comment: occassional  . Drug use: Yes    Types: Marijuana    Comment: use on daily basics  . Sexual activity: Yes    Birth control/protection: None  Lifestyle  . Physical activity:    Days per week: Not on file    Minutes per session: Not on file  . Stress: To some extent  Relationships  . Social connections:    Talks on phone: Twice a week    Gets together: Twice a week    Attends religious service: Never    Active member of club or organization: Yes    Attends meetings of clubs or organizations: More than 4 times per year    Relationship status: Never married  Other Topics Concern  . Not on file  Social History Narrative   Lives at home alone.  He is unemployed.  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      His Allergies Are:  Allergies  Allergen Reactions  . Losartan     Diarrhea, CP, fatigue  :   His Current Medications Are:  Outpatient Encounter Medications as of 03/23/2018  Medication Sig  . Acetylcysteine (NAC PO) Take by mouth.  . ALPRAZolam (XANAX) 0.5 MG tablet Take 0.25-0.5 mg by mouth 3 (three) times daily as needed. Anxiety   . cariprazine (VRAYLAR) capsule Take 3 mg by mouth.  . cetirizine (ZYRTEC) 10 MG tablet Take 10 mg by mouth daily.  . Cyanocobalamin (VITAMIN B-12) 500 MCG SUBL Place 1 tablet (500 mcg total) under the tongue 1 day or 1 dose.  . Dietary Management Product (VASCULERA) TABS Take 1 tablet by mouth daily.  . finasteride (PROPECIA) 1 MG tablet One quarter tablet daily  . fish oil-omega-3 fatty acids 1000 MG capsule Take 1 g by mouth daily.   Marland Kitchen guanFACINE (TENEX) 1 MG tablet Take 1 mg by mouth at bedtime.  . lamoTRIgine  (LAMICTAL) 200 MG tablet Take 100 mg by mouth daily.   Marland Kitchen omeprazole (PRILOSEC) 20 MG capsule Take 20 mg by mouth daily.  Marland Kitchen OVER THE COUNTER MEDICATION niagen booster takes on awakening prn  . oxymetazoline (AFRIN) 0.05 % nasal spray Place 1 spray into both nostrils 3 (three) times daily.  . Tasimelteon (HETLIOZ) 20 MG CAPS Take by mouth at bedtime.   . Testosterone (ANDROGEL PUMP) 20.25 MG/ACT (1.62%) GEL Place 2 Act onto the  skin every morning.  . Triamcinolone Acetonide (NASACORT AQ NA) Place into the nose.  . [DISCONTINUED] Armodafinil 200 MG TABS Take by mouth. Take by mouth daily.  . [DISCONTINUED] ketoconazole (NIZORAL) 2 % cream Apply 1 application topically daily.  . [DISCONTINUED] metoprolol tartrate (LOPRESSOR) 25 MG tablet Take 1 tablet (25 mg total) by mouth every 12 (twelve) hours as needed. For heart palpatations.   No facility-administered encounter medications on file as of 03/23/2018.   :  Review of Systems:  Out of a complete 14 point review of systems, all are reviewed and negative with the exception of these symptoms as listed below:  Review of Systems  Neurological:       Pt presents today to discuss his sleep. Pt has had a sleep study in the past and tried cpap. Pt could not tolerate cpap. Pt wants to discuss oral appliances to treat osa.  Epworth Sleepiness Scale 0= would never doze 1= slight chance of dozing 2= moderate chance of dozing 3= high chance of dozing  Sitting and reading: 1 Watching TV: 2 Sitting inactive in a public place (ex. Theater or meeting): 1 As a passenger in a car for an hour without a break: 1 Lying down to rest in the afternoon: 0-3 Sitting and talking to someone: 1 Sitting quietly after lunch (no alcohol): 1 In a car, while stopped in traffic: 1 Total: 8-11     Objective:  Neurological Exam  Physical Exam Physical Examination:   Vitals:   03/23/18 1615  BP: (!) 149/95  Pulse: 81   General Examination: The patient is a  very pleasant 37 y.o. male in no acute distress. He appears well-developed and well-nourished and adequately groomed. Wears dark glasses.   HEENT: Normocephalic, atraumatic, pupils are equal, round and reactive to light and accommodation. Extraocular tracking is good without limitation to gaze excursion or nystagmus noted. Normal smooth pursuit is noted. Hearing is grossly intact. Face is symmetric with normal facial animation and normal facial sensation. Speech is clear with no dysarthria noted. There is no hypophonia. There is no lip, neck/head, jaw or voice tremor. Neck is supple with full range of passive and active motion. There are no carotid bruits on auscultation. Oropharynx exam reveals: moderate mouth dryness, adequate dental hygiene and mild airway crowding, due to smaller airway entry and tonsils of about 1-2+. Mallampati is class II. Neck circumference is 18-1/8 inches. He has a mild overbite.  Chest: Clear to auscultation without wheezing, rhonchi or crackles noted.  Heart: S1+S2+0, regular and normal without murmurs, rubs or gallops noted.   Abdomen: Soft, non-tender and non-distended.  Extremities: There is no obvious edema.  Skin: Warm and dry without trophic changes noted.  Musculoskeletal: exam reveals no obvious joint deformities, tenderness or joint swelling or erythema.   Neurologically:  Mental status: The patient is awake, alert and oriented in all 4 spheres. His immediate and remote memory, attention, language skills and fund of knowledge are appropriate. There is no evidence of aphasia, agnosia, apraxia or anomia. Speech is clear with normal prosody and enunciation. Thought process is linear. Mood is normal and affect is constricted.  Cranial nerves II - XII are as described above under HEENT exam. In addition: shoulder shrug is normal with equal shoulder height noted. Motor exam: Normal bulk, strength and tone is noted. There is no tremor. Fine motor skills and  coordination: grossly intact.  Cerebellar testing: No dysmetria or intention tremor. There is no truncal or gait ataxia.  Sensory  exam: intact to light touch in the upper and lower extremities.  Gait, station and balance: He stands easily. No veering to one side is noted. No leaning to one side is noted. Posture is age-appropriate and stance is narrow based. Gait shows normal stride length and normal pace. No problems turning are noted.                Assessment and Plan:  In summary, Herbert Bryant is a very pleasant 37 y.o.-year old male with an underlying medical history of mood disorder with diagnosis of bipolar disorder, anxiety, ADD, allergies, reflux disease, irritable bowel syndrome and morbid obesity with a BMI of over 40, who presents for evaluation of his obstructive sleep apnea. He had sleep study testing over 4 years ago. He is advised to undergo repeat sleep study testing for an updated diagnosis. His weight has been fluctuating. He is indicating that he will not be able to tolerate CPAP. He indicates also that he will not be able to come in for sleep study in our sleep lab. He would be willing to try a home sleep test. I ordered a home sleep test and we will be in touch to schedule this for him. He would be more inclined to try an oral appliance for treatment of sleep apnea. He is not sure that his own dentist provides sleep apnea treatment with dental devices. We can help with facilitating a referral after his home sleep test is completed. I answered all his questions today and he was in agreement. Thank you very much for allowing me to participate in the care of this nice patient. If I can be of any further assistance to you please do not hesitate to talk to me.  Sincerely,   Herbert Foley, MD, PhD

## 2018-03-25 ENCOUNTER — Telehealth: Payer: Self-pay | Admitting: Internal Medicine

## 2018-03-25 NOTE — Telephone Encounter (Signed)
Copied from CRM [id number]. Topic: General - Other >> Mar 25, 2018  2:14 PM Gean BirchwoodWilliams-Neal, Sade R wrote: Pt is wanting sample pack of Dietary Management Product (VASCULERA) TABS until insurance clears coverage and also wanted script for Testosterone (ANDROGEL PUMP) 20.25 MG/ACT (1.62%) GEL to be updated to 3 pumps instead of 2

## 2018-03-26 ENCOUNTER — Other Ambulatory Visit: Payer: Self-pay

## 2018-03-26 MED ORDER — VASCULERA PO TABS: 1.0000 | ORAL_TABLET | Freq: Every day | ORAL | 3 refills | Status: DC

## 2018-03-26 NOTE — Telephone Encounter (Signed)
Please advise 

## 2018-04-01 ENCOUNTER — Other Ambulatory Visit: Payer: Self-pay | Admitting: Family Medicine

## 2018-04-01 DIAGNOSIS — L649 Androgenic alopecia, unspecified: Secondary | ICD-10-CM

## 2018-04-01 NOTE — Telephone Encounter (Signed)
Pt is calling and  per pt he was  told to increase to  3 pumps of  testosterone onto the skin daily. Pt will needs new rx and PA. [facility]. Pt had 2nd blood drawn to check his testosterone level

## 2018-04-02 ENCOUNTER — Ambulatory Visit: Payer: BLUE CROSS/BLUE SHIELD | Admitting: Family

## 2018-04-02 ENCOUNTER — Other Ambulatory Visit: Payer: Self-pay | Admitting: Family

## 2018-04-02 ENCOUNTER — Encounter: Payer: Self-pay | Admitting: Family

## 2018-04-02 ENCOUNTER — Other Ambulatory Visit: Payer: BLUE CROSS/BLUE SHIELD

## 2018-04-02 VITALS — BP 140/82 | HR 88 | Temp 98.2°F | Ht 70.0 in | Wt 309.1 lb

## 2018-04-02 DIAGNOSIS — R300 Dysuria: Secondary | ICD-10-CM

## 2018-04-02 LAB — POC URINALSYSI DIPSTICK (AUTOMATED)
Bilirubin, UA: NEGATIVE
Blood, UA: NEGATIVE
Glucose, UA: NEGATIVE
KETONES UA: NEGATIVE
Nitrite, UA: NEGATIVE
PH UA: 7.5 (ref 5.0–8.0)
Protein, UA: NEGATIVE
Spec Grav, UA: 1.015 (ref 1.010–1.025)
Urobilinogen, UA: 0.2 E.U./dL

## 2018-04-02 MED ORDER — CIPROFLOXACIN HCL 500 MG PO TABS: 500.0000 mg | ORAL_TABLET | Freq: Two times a day (BID) | ORAL | 0 refills | Status: DC

## 2018-04-02 NOTE — Addendum Note (Signed)
Addended by: Karma GanjaSMITH, CARLA J on: 04/02/2018 04:24 PM   Modules accepted: Orders

## 2018-04-02 NOTE — Progress Notes (Signed)
Herbert Bryant is a 37 y.o. male with the following history as recorded in EpicCare:  Patient Active Problem List   Diagnosis Date Noted  . Hypogonadism male 02/22/2018  . HTN (hypertension) 02/22/2018  . Chronic venous insufficiency 02/22/2018  . Moderate tetrahydrocannabinol (THC) dependence (HCC) 01/25/2018  . Sleep disorder 01/25/2018  . Tinea pedis 01/25/2018  . Edema 01/22/2018  . Bipolar depression (HCC) 01/22/2018  . Rash 01/22/2018  . Delirium of mixed origin 07/22/2017  . Acute cystitis with positive culture 01/05/2017  . 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 OSA (obstructive sleep apnea) 03/24/2014  . Morbid obesity (HCC) 12/14/2013  . Allergic rhinitis 12/14/2013    Current Outpatient Medications  Medication Sig Dispense Refill  . Acetylcysteine (NAC PO) Take by mouth.    . ALPRAZolam (XANAX) 0.5 MG tablet Take 0.25-0.5 mg by mouth 3 (three) times daily as needed. Anxiety     . cariprazine (VRAYLAR) capsule Take 3 mg by mouth.    . cetirizine (ZYRTEC) 10 MG tablet Take 10 mg by mouth daily.    . Cyanocobalamin (VITAMIN B-12) 500 MCG SUBL Place 1 tablet (500 mcg total) under the tongue 1 day or 1 dose. 100 tablet 3  . Dietary Management Product (VASCULERA) TABS Take 1 tablet by mouth daily. 90 tablet 3  . finasteride (PROPECIA) 1 MG tablet One quarter tablet daily 30 tablet 5  . fish oil-omega-3 fatty acids 1000 MG capsule Take 1 g by mouth daily.     Marland Kitchen. guanFACINE (TENEX) 1 MG tablet Take 1 mg by mouth at bedtime.    . lamoTRIgine (LAMICTAL) 200 MG tablet Take 100 mg by mouth daily.     Marland Kitchen. omeprazole (PRILOSEC) 20 MG capsule Take 20 mg by mouth daily.    Marland Kitchen. OVER THE COUNTER MEDICATION niagen booster takes on awakening prn    . oxymetazoline (AFRIN) 0.05 % nasal spray Place 1 spray into both nostrils 3 (three) times daily.    . Tasimelteon (HETLIOZ) 20 MG CAPS Take by mouth at bedtime.     . Testosterone (ANDROGEL PUMP) 20.25 MG/ACT (1.62%) GEL Place 2 Act onto the skin  every morning. 75 g 5  . traZODone (DESYREL) 50 MG tablet Take 150 mg by mouth at bedtime.  1  . Triamcinolone Acetonide (NASACORT AQ NA) Place into the nose.    . ciprofloxacin (CIPRO) 500 MG tablet Take 1 tablet (500 mg total) by mouth 2 (two) times daily. 20 tablet 0   No current facility-administered medications for this visit.     Allergies: Losartan  Past Medical History:  Diagnosis Date  . Acne   . ADD (attention deficit disorder)   . ADHD (attention deficit hyperactivity disorder)   . Allergy   . Anxiety   . Bipolar 1 disorder (HCC)   . Depression   . GERD (gastroesophageal reflux disease)   . IBS (irritable bowel syndrome)     Past Surgical History:  Procedure Laterality Date  . MOUTH SURGERY  1993   skin graft  . WISDOM TOOTH EXTRACTION      Family History  Problem Relation Age of Onset  . Arthritis Maternal Grandmother   . Stroke Maternal Grandmother   . Hypertension Maternal Grandmother   . Alzheimer's disease Maternal Grandmother   . Arthritis Maternal Grandfather   . Alzheimer's disease Maternal Grandfather   . Emphysema Paternal Grandfather   . Alcohol abuse Mother   . Depression Mother   . Skin cancer Mother   . Skin  cancer Father     Social History   Tobacco Use  . Smoking status: Former Smoker    Packs/day: 0.50    Types: Cigarettes    Last attempt to quit: 06/25/2012    Years since quitting: 5.7  . Smokeless tobacco: Never Used  Substance Use Topics  . Alcohol use: No    Alcohol/week: 0.0 standard drinks    Frequency: Never    Comment: occassional    Subjective:  Patient complaining of recurrent sense of dysuria x 2 months; complaining of "intense feeling" with urination; no blood in urine; no fever; was seen in July after being told he may have been exposed to an STD; labs at that time were normal; urine culture not remarkable; no prior history of prostatitis;    Objective:  Vitals:   04/02/18 1548  BP: 140/82  Pulse: 88  Temp: 98.2  F (36.8 C)  TempSrc: Oral  SpO2: 95%  Weight: (!) 309 lb 1.3 oz (140.2 kg)  Height: 5\' 10"  (1.778 m)    General: Well developed, well nourished, in no acute distress  Skin : Warm and dry.  Head: Normocephalic and atraumatic  Lungs: Respirations unlabored; Neurologic: Alert and oriented; speech intact; face symmetrical; moves all extremities well; CNII-XII intact without focal deficit   Assessment:  1. Dysuria     Plan:  Check U/A and UTI; ? Prostatitis; Rx for Cipro 500 mg bid x 10 days; may need urology evaluation.  No follow-ups on file.  No orders of the defined types were placed in this encounter.   Requested Prescriptions   Signed Prescriptions Disp Refills  . ciprofloxacin (CIPRO) 500 MG tablet 20 tablet 0    Sig: Take 1 tablet (500 mg total) by mouth 2 (two) times daily.

## 2018-04-02 NOTE — Patient Instructions (Signed)
Prostatitis Prostatitis is swelling of the prostate gland. The prostate helps to make semen. It is below a man's bladder, in front of the rectum. There are different types of prostatitis. Follow these instructions at home:  Take over-the-counter and prescription medicines only as told by your doctor.  If you were prescribed an antibiotic medicine, take it as told by your doctor. Do not stop taking the antibiotic even if you start to feel better.  If your doctor prescribed exercises, do them as directed.  Take sitz baths as told by your doctor. To take a sitz bath, sit in warm water that is deep enough to cover your hips and butt.  Keep all follow-up visits as told by your doctor. This is important. Contact a doctor if:  Your symptoms get worse.  You have a fever. Get help right away if:  You have chills.  You feel sick to your stomach (nauseous).  You throw up (vomit).  You feel light-headed.  You feel like you might pass out (faint).  You cannot pee (urinate).  You have blood or clumps of blood (blood clots) in your pee (urine). This information is not intended to replace advice given to you by your health care provider. Make sure you discuss any questions you have with your health care provider. Document Released: 12/30/2011 Document Revised: 03/20/2016 Document Reviewed: 03/20/2016 Elsevier Interactive Patient Education  2017 Elsevier Inc.  

## 2018-04-04 LAB — URINE CULTURE
MICRO NUMBER:: 91132416
SPECIMEN QUALITY:: ADEQUATE

## 2018-04-05 ENCOUNTER — Other Ambulatory Visit: Payer: Self-pay | Admitting: Family

## 2018-04-05 DIAGNOSIS — R300 Dysuria: Secondary | ICD-10-CM

## 2018-04-08 ENCOUNTER — Other Ambulatory Visit: Payer: Self-pay | Admitting: Internal Medicine

## 2018-04-08 DIAGNOSIS — E291 Testicular hypofunction: Secondary | ICD-10-CM

## 2018-04-08 MED ORDER — TESTOSTERONE 20.25 MG/ACT (1.62%) TD GEL: 3.0000 | TRANSDERMAL | 0 refills | Status: DC

## 2018-04-08 NOTE — Telephone Encounter (Signed)
Please advise about refill in Dr. Plotnikovs absence. 

## 2018-04-08 NOTE — Telephone Encounter (Signed)
Copied from CRM [id number]. Topic: Quick Communication - Rx Refill/Question >> Apr 08, 2018 11:50 AM Crist Infante wrote: Medication: Testosterone (ANDROGEL PUMP) 20.25 MG/ACT (1.62%) GEL Pt states the dr increased this med from  2 to 3 pumps a day Pt needs a new Rx because he ran out early. Pharmacy will not refill.  [id number] - [place], [place] - [address] AT [facility] [address] [phone] (Phone) [phone] (Fax)

## 2018-04-09 NOTE — Telephone Encounter (Signed)
Key: [id number]

## 2018-04-14 ENCOUNTER — Ambulatory Visit (INDEPENDENT_AMBULATORY_CARE_PROVIDER_SITE_OTHER): Payer: BLUE CROSS/BLUE SHIELD | Admitting: Neurology

## 2018-04-14 DIAGNOSIS — R0683 Snoring: Secondary | ICD-10-CM

## 2018-04-14 DIAGNOSIS — G4719 Other hypersomnia: Secondary | ICD-10-CM

## 2018-04-14 DIAGNOSIS — G4733 Obstructive sleep apnea (adult) (pediatric): Secondary | ICD-10-CM

## 2018-04-14 DIAGNOSIS — G4723 Circadian rhythm sleep disorder, irregular sleep wake type: Secondary | ICD-10-CM

## 2018-04-21 NOTE — Telephone Encounter (Signed)
PA denied.

## 2018-04-22 ENCOUNTER — Telehealth: Payer: Self-pay

## 2018-04-22 NOTE — Telephone Encounter (Signed)
I called pt. I advised pt that Dr. Frances Furbish reviewed pt's sleep study and found that did not show any significant osa, some snoring was noted. Dr. Frances Furbish recommends that pt follow up with Dr. Marjory Lies. I reviewed sleep hygiene recommendations with the pt, including trying to keep a regular sleep wake schedule, avoiding electronics in the bedroom, keeping the bedroom cool, dark, and quiet, and avoiding eating or exercising within 2 hours of bedtime as well as eating in the middle of the night. I advised pt to keep pets out of the bedroom. I discussed with pt the importance of stress relief and to try meditation, deep breathing exercises, and/or a white noise machine or fan to diffuse other noise distractors. I advised pt to not drink alcohol before bedtime and to never mix alcohol and sedating medications. Pt was advised to avoid narcotic pain medication close to bedtime. I advised pt that a copy of these sleep study results will be sent to Dr. Marjory Lies. Pt verbalized understanding of results. Pt had no questions at this time but was encouraged to call back if questions arise.

## 2018-04-22 NOTE — Telephone Encounter (Signed)
-----   Message from [name], MD sent at 04/22/2018  8:37 AM EDT ----- Patient referred by Dr. Marjory Lies, seen by me on 03/23/18, HST on 04/15/18.   Please call and notify the patient that the recent home sleep test did not show any significant obstructive sleep apnea. Some snoring was noted. Patient can follow up with the referring provider.   Please remind patient to try to maintain good sleep hygiene, which means: Keep a regular sleep and wake schedule and make enough time for sleep (7 1/2 to 8 1/2 hours for the average adult), try not to exercise or have a meal within 2 hours of your bedtime, try to keep your bedroom conducive for sleep, that is, cool and dark, without light distractors such as an illuminated alarm clock, and refrain from watching TV right before sleep or in the middle of the night and do not keep the TV or radio on during the night. If a nightlight is used, have it away from the visual field. Also, try not to use or play on electronic devices at bedtime, such as your cell phone, tablet PC or laptop. If you like to read at bedtime on an electronic device, try to dim the background light as much as possible. Do not eat in the middle of the night. Keep pets away from the bedroom environment. For stress relief, try meditation, deep breathing exercises (there are many books and CDs available), a white noise machine or fan can help to diffuse other noise distractors, such as traffic noise. Do not drink alcohol before bedtime, as it can disturb sleep and cause middle of the night awakenings. Never mix alcohol and sedating medications! Avoid narcotic pain medication close to bedtime, as opioids/narcotics can suppress breathing drive and breathing effort.     Thanks,  [name], MD, PhD [facility])

## 2018-04-22 NOTE — Progress Notes (Signed)
Patient referred by Dr. Marjory Lies, seen by me on 03/23/18, HST on 04/15/18.   Please call and notify the patient that the recent home sleep test did not show any significant obstructive sleep apnea. Some snoring was noted. Patient can follow up with the referring provider.   Please remind patient to try to maintain good sleep hygiene, which means: Keep a regular sleep and wake schedule and make enough time for sleep (7 1/2 to 8 1/2 hours for the average adult), try not to exercise or have a meal within 2 hours of your bedtime, try to keep your bedroom conducive for sleep, that is, cool and dark, without light distractors such as an illuminated alarm clock, and refrain from watching TV right before sleep or in the middle of the night and do not keep the TV or radio on during the night. If a nightlight is used, have it away from the visual field. Also, try not to use or play on electronic devices at bedtime, such as your cell phone, tablet PC or laptop. If you like to read at bedtime on an electronic device, try to dim the background light as much as possible. Do not eat in the middle of the night. Keep pets away from the bedroom environment. For stress relief, try meditation, deep breathing exercises (there are many books and CDs available), a white noise machine or fan can help to diffuse other noise distractors, such as traffic noise. Do not drink alcohol before bedtime, as it can disturb sleep and cause middle of the night awakenings. Never mix alcohol and sedating medications! Avoid narcotic pain medication close to bedtime, as opioids/narcotics can suppress breathing drive and breathing effort.     Thanks,  [name], MD, PhD [facility])

## 2018-04-22 NOTE — Procedures (Signed)
[facility] [address] [place], [place] [address] NAME:  Herbert Bryant                                                                DOB: 03-Dec-1980 MEDICAL RECORD NUMBER  [id number]                                             DOS: 04/15/2018  REFERRING PHYSICIAN: Joycelyn Schmid, MD STUDY PERFORMED: Home Sleep Test HISTORY: 36 year old man with a history of mood disorder, anxiety, ADD, allergies, reflux disease, irritable bowel syndrome and morbid obesity, who reports snoring and excessive daytime somnolence. His Epworth sleepiness score is 11 out of 24, fatigue score is 57 out of 63. BMI 44.3.   STUDY RESULTS:  Total Recording Time: 6 hours, 18 minutes Total Apnea/Hypopnea Index (AHI): 3.1/h, RDI: 9.1/h Average Oxygen Saturation: 92%, Lowest Oxygen Desaturation: 84%  Total Time Oxygen Saturation Below or at 88%:  6.0 minutes  Average Heart Rate: 69 bpm (between 55 and 105 bpm) IMPRESSION: Excessive Daytime sleepiness; Snoring RECOMMENDATION: This home sleep test does not demonstrate any significant obstructive or central sleep disordered breathing. Some snoring was noted. Other causes of the patient's symptoms, including circadian rhythm disturbances, an underlying mood disorder, medication effect and/or an underlying medical problem cannot be ruled out based on this test. Clinical correlation is recommended. The patient should be cautioned not to drive, work at heights, or operate dangerous or heavy equipment when tired or sleepy. Review and reiteration of good sleep hygiene measures should be pursued with any patient. The patient can follow up with his referring provider, who will be notified of the test results.    I certify that I have reviewed the raw data recording prior to the issuance of this report in accordance with the standards of the American Academy of Sleep Medicine (AASM).    [name], MD, PhD Diplomat, ABPN (Neurology and Sleep)

## 2018-05-03 DIAGNOSIS — H5021 Vertical strabismus, right eye: Secondary | ICD-10-CM | POA: Diagnosis not present

## 2018-05-03 DIAGNOSIS — H5310 Unspecified subjective visual disturbances: Secondary | ICD-10-CM | POA: Diagnosis not present

## 2018-05-05 DIAGNOSIS — H5212 Myopia, left eye: Secondary | ICD-10-CM | POA: Diagnosis not present

## 2018-05-05 DIAGNOSIS — H5053 Vertical heterophoria: Secondary | ICD-10-CM | POA: Diagnosis not present

## 2018-05-05 DIAGNOSIS — H5010 Unspecified exotropia: Secondary | ICD-10-CM | POA: Diagnosis not present

## 2018-05-05 DIAGNOSIS — H5022 Vertical strabismus, left eye: Secondary | ICD-10-CM | POA: Diagnosis not present

## 2018-05-06 ENCOUNTER — Encounter: Payer: Self-pay | Admitting: Internal Medicine

## 2018-06-11 ENCOUNTER — Other Ambulatory Visit: Payer: Self-pay | Admitting: Internal Medicine

## 2018-06-11 DIAGNOSIS — E291 Testicular hypofunction: Secondary | ICD-10-CM

## 2018-07-01 ENCOUNTER — Ambulatory Visit: Payer: BLUE CROSS/BLUE SHIELD | Admitting: Internal Medicine

## 2018-07-13 ENCOUNTER — Ambulatory Visit: Payer: BLUE CROSS/BLUE SHIELD | Admitting: Internal Medicine

## 2018-07-13 ENCOUNTER — Encounter: Payer: Self-pay | Admitting: Internal Medicine

## 2018-07-13 DIAGNOSIS — I10 Essential (primary) hypertension: Secondary | ICD-10-CM

## 2018-07-13 DIAGNOSIS — G4733 Obstructive sleep apnea (adult) (pediatric): Secondary | ICD-10-CM

## 2018-07-13 DIAGNOSIS — E291 Testicular hypofunction: Secondary | ICD-10-CM

## 2018-07-13 DIAGNOSIS — F319 Bipolar disorder, unspecified: Secondary | ICD-10-CM

## 2018-07-13 NOTE — Assessment & Plan Note (Signed)
No need for a CPAP per Neurology

## 2018-07-13 NOTE — Assessment & Plan Note (Signed)
In a manic stage now

## 2018-07-13 NOTE — Progress Notes (Signed)
Subjective:  Patient ID: Herbert Bryant, male    DOB: 12-15-1980  Age: 37 y.o. MRN: [id number]  CC: No chief complaint on file.   HPI Herbert Guernsey[place] O Bryant presents for hypogonadism, depression, GERD f/u  Outpatient Medications Prior to Visit  Medication Sig Dispense Refill  . Acetylcysteine (NAC PO) Take by mouth.    . ALPRAZolam (XANAX) 0.5 MG tablet Take 0.25-0.5 mg by mouth 3 (three) times daily as needed. Anxiety     . cariprazine (VRAYLAR) capsule Take 3 mg by mouth.    . cetirizine (ZYRTEC) 10 MG tablet Take 10 mg by mouth daily.    . ciprofloxacin (CIPRO) 500 MG tablet Take 1 tablet (500 mg total) by mouth 2 (two) times daily. 20 tablet 0  . Cyanocobalamin (VITAMIN B-12) 500 MCG SUBL Place 1 tablet (500 mcg total) under the tongue 1 day or 1 dose. 100 tablet 3  . Dietary Management Product (VASCULERA) TABS Take 1 tablet by mouth daily. 90 tablet 3  . finasteride (PROPECIA) 1 MG tablet One quarter tablet daily 30 tablet 5  . fish oil-omega-3 fatty acids 1000 MG capsule Take 1 g by mouth daily.     Marland Kitchen. guanFACINE (TENEX) 1 MG tablet Take 1 mg by mouth at bedtime.    . lamoTRIgine (LAMICTAL) 200 MG tablet Take 100 mg by mouth daily.     Marland Kitchen. omeprazole (PRILOSEC) 20 MG capsule Take 20 mg by mouth daily.    Marland Kitchen. OVER THE COUNTER MEDICATION niagen booster takes on awakening prn    . oxymetazoline (AFRIN) 0.05 % nasal spray Place 1 spray into both nostrils 3 (three) times daily.    . Tasimelteon (HETLIOZ) 20 MG CAPS Take by mouth at bedtime.     . Testosterone (ANDROGEL PUMP) 20.25 MG/ACT (1.62%) GEL Place 3 Act onto the skin every morning. 150 g 0  . traZODone (DESYREL) 50 MG tablet Take 150 mg by mouth at bedtime.  1  . Triamcinolone Acetonide (NASACORT AQ NA) Place into the nose.     No facility-administered medications prior to visit.     ROS: Review of Systems  Constitutional: Positive for unexpected weight change. Negative for appetite change and fatigue.  HENT: Negative for  congestion, nosebleeds, sneezing, sore throat and trouble swallowing.   Eyes: Negative for itching and visual disturbance.  Respiratory: Negative for cough.   Cardiovascular: Negative for chest pain, palpitations and leg swelling.  Gastrointestinal: Negative for abdominal distention, blood in stool, diarrhea and nausea.  Genitourinary: Negative for frequency and hematuria.  Musculoskeletal: Negative for back pain, gait problem, joint swelling and neck pain.  Skin: Negative for rash.  Neurological: Negative for dizziness, tremors, speech difficulty and weakness.  Psychiatric/Behavioral: Positive for sleep disturbance. Negative for agitation and dysphoric mood. The patient is not nervous/anxious.     Objective:  BP 136/82 (BP Location: Right Arm, Patient Position: Sitting, Cuff Size: Large)   Pulse 68   Temp 97.7 F (36.5 C) (Oral)   Ht 5\' 10"  (1.778 m)   Wt (!) 316 lb (143.3 kg)   SpO2 97%   BMI 45.34 kg/m   BP Readings from Last 3 Encounters:  07/13/18 136/82  04/02/18 140/82  03/23/18 (!) 149/95    Wt Readings from Last 3 Encounters:  07/13/18 (!) 316 lb (143.3 kg)  04/02/18 (!) 309 lb 1.3 oz (140.2 kg)  03/23/18 (!) 309 lb (140.2 kg)    Physical Exam Constitutional:      General: He is not in  acute distress.    Appearance: He is well-developed.     Comments: NAD  Eyes:     Conjunctiva/sclera: Conjunctivae normal.     Pupils: Pupils are equal, round, and reactive to light.  Neck:     Musculoskeletal: Normal range of motion.     Thyroid: No thyromegaly.     Vascular: No JVD.  Cardiovascular:     Rate and Rhythm: Normal rate and regular rhythm.     Heart sounds: Normal heart sounds. No murmur. No friction rub. No gallop.   Pulmonary:     Effort: Pulmonary effort is normal. No respiratory distress.     Breath sounds: Normal breath sounds. No wheezing or rales.  Chest:     Chest wall: No tenderness.  Abdominal:     General: Bowel sounds are normal. There is no  distension.     Palpations: Abdomen is soft. There is no mass.     Tenderness: There is no abdominal tenderness. There is no guarding or rebound.  Musculoskeletal: Normal range of motion.        General: No tenderness.  Lymphadenopathy:     Cervical: No cervical adenopathy.  Skin:    General: Skin is warm and dry.     Findings: No rash.  Neurological:     Mental Status: He is alert and oriented to person, place, and time.     Cranial Nerves: No cranial nerve deficit.     Motor: No abnormal muscle tone.     Coordination: Coordination normal.     Gait: Gait normal.     Deep Tendon Reflexes: Reflexes are normal and symmetric.  Psychiatric:        Behavior: Behavior normal.        Thought Content: Thought content normal.        Judgment: Judgment normal.   Obese  Lab Results  Component Value Date   WBC 9.6 03/18/2018   HGB 14.6 03/18/2018   HCT 43.3 03/18/2018   PLT 200.0 03/18/2018   GLUCOSE 87 03/18/2018   CHOL 238 (H) 01/02/2017   TRIG 236 (H) 01/02/2017   HDL 29 (L) 01/02/2017   LDLCALC 162 (H) 01/02/2017   ALT 15 03/18/2018   AST 11 03/18/2018   NA 138 03/18/2018   K 3.8 03/18/2018   CL 103 03/18/2018   CREATININE 0.92 03/18/2018   BUN 14 03/18/2018   CO2 29 03/18/2018   TSH 3.51 01/22/2018   HGBA1C 5.4 12/09/2017    No results found.  Assessment & Plan:   There are no diagnoses linked to this encounter.   No orders of the defined types were placed in this encounter.    Follow-up: No follow-ups on file.  Sonda PrimesAlex Traniyah Hallett, MD

## 2018-07-13 NOTE — Assessment & Plan Note (Signed)
Wt Readings from Last 3 Encounters:  07/13/18 (!) 316 lb (143.3 kg)  04/02/18 (!) 309 lb 1.3 oz (140.2 kg)  03/23/18 (!) 309 lb (140.2 kg)

## 2018-07-13 NOTE — Assessment & Plan Note (Signed)
AM labs x2 while off Rx per [employer] PA requirement

## 2018-07-13 NOTE — Assessment & Plan Note (Signed)
BP Readings from Last 3 Encounters:  07/13/18 136/82  04/02/18 140/82  03/23/18 (!) 149/95

## 2018-07-26 ENCOUNTER — Other Ambulatory Visit (INDEPENDENT_AMBULATORY_CARE_PROVIDER_SITE_OTHER): Payer: BLUE CROSS/BLUE SHIELD

## 2018-07-26 DIAGNOSIS — E291 Testicular hypofunction: Secondary | ICD-10-CM | POA: Diagnosis not present

## 2018-07-26 LAB — TESTOSTERONE: Testosterone: 274.99 ng/dL — ABNORMAL LOW (ref 300.00–890.00)

## 2018-07-29 ENCOUNTER — Other Ambulatory Visit (INDEPENDENT_AMBULATORY_CARE_PROVIDER_SITE_OTHER): Payer: BLUE CROSS/BLUE SHIELD

## 2018-07-29 DIAGNOSIS — E291 Testicular hypofunction: Secondary | ICD-10-CM | POA: Diagnosis not present

## 2018-07-29 LAB — TESTOSTERONE: Testosterone: 213.67 ng/dL — ABNORMAL LOW (ref 300.00–890.00)

## 2018-08-02 ENCOUNTER — Telehealth: Payer: Self-pay | Admitting: Internal Medicine

## 2018-08-02 NOTE — Telephone Encounter (Signed)
Copied from CRM [id number]. Topic: Quick Communication - See Telephone Encounter >> Aug 02, 2018  9:42 AM Arlyss Gandy, NT wrote: CRM for notification. See Telephone encounter for: 08/02/18.  Pt requesting PA for the Testerone he takes. He has already used his shots but needs this done with the lab work he did last week. May leave message if you call pt.

## 2018-08-06 NOTE — Telephone Encounter (Signed)
Key: [id number]

## 2018-08-13 NOTE — Telephone Encounter (Signed)
PA canceled by insurance, appeal must be done. Pt must sign form to send in appeal, mychart message sent to pt

## 2018-08-25 NOTE — Telephone Encounter (Signed)
Patient stated he came to the practice and signed the paperwork on either 08/16/18 or 08/17/18.  He is wanting to know the status of the pre authorization.

## 2018-09-01 NOTE — Telephone Encounter (Signed)
LM notifying pt we are waiting to hear back from insurance on appeal

## 2018-09-01 NOTE — Telephone Encounter (Signed)
Patient called again regarding his PA. He came in to sign the forms but still has not had an updated on it as of yet. Please let patient know the status.  CB# [phone]

## 2018-09-28 DIAGNOSIS — F900 Attention-deficit hyperactivity disorder, predominantly inattentive type: Secondary | ICD-10-CM | POA: Diagnosis not present

## 2018-09-28 DIAGNOSIS — F341 Dysthymic disorder: Secondary | ICD-10-CM | POA: Diagnosis not present

## 2018-09-28 DIAGNOSIS — F331 Major depressive disorder, recurrent, moderate: Secondary | ICD-10-CM | POA: Diagnosis not present

## 2018-10-04 NOTE — Telephone Encounter (Signed)
Patient calling to check on an update on his PA appeal for his testosterone. Please advise.

## 2018-10-05 ENCOUNTER — Other Ambulatory Visit: Payer: Self-pay | Admitting: Internal Medicine

## 2018-10-05 DIAGNOSIS — E291 Testicular hypofunction: Secondary | ICD-10-CM

## 2018-10-07 NOTE — Telephone Encounter (Signed)
Pt notified that a new PA was able to be done and has been submitted

## 2018-10-11 ENCOUNTER — Other Ambulatory Visit: Payer: Self-pay

## 2018-10-11 DIAGNOSIS — E291 Testicular hypofunction: Secondary | ICD-10-CM

## 2018-10-11 NOTE — Telephone Encounter (Signed)
Copied from CRM [id number]. Topic: General - Other >> Oct 11, 2018 10:40 AM Percival Spanish wrote: Pt said he picked the RX today and it was not correct said what he picked up was a old RX  Said he should have been picking up 2 bottles 3 pumps but was given 1 bottle 2 pumps call to ask if this can be corrected   [facility] [address] [place]

## 2018-10-14 MED ORDER — TESTOSTERONE 20.25 MG/ACT (1.62%) TD GEL: 3.0000 | TRANSDERMAL | 5 refills | Status: DC

## 2018-11-22 ENCOUNTER — Other Ambulatory Visit: Payer: Self-pay

## 2018-11-22 ENCOUNTER — Other Ambulatory Visit (INDEPENDENT_AMBULATORY_CARE_PROVIDER_SITE_OTHER): Payer: BLUE CROSS/BLUE SHIELD

## 2018-11-22 ENCOUNTER — Encounter: Payer: Self-pay | Admitting: Family

## 2018-11-22 ENCOUNTER — Ambulatory Visit (INDEPENDENT_AMBULATORY_CARE_PROVIDER_SITE_OTHER): Payer: BLUE CROSS/BLUE SHIELD | Admitting: Family

## 2018-11-22 VITALS — BP 146/84 | HR 113 | Temp 97.7°F | Ht 70.0 in | Wt 300.2 lb

## 2018-11-22 DIAGNOSIS — N390 Urinary tract infection, site not specified: Secondary | ICD-10-CM | POA: Diagnosis not present

## 2018-11-22 DIAGNOSIS — I10 Essential (primary) hypertension: Secondary | ICD-10-CM | POA: Diagnosis not present

## 2018-11-22 DIAGNOSIS — R600 Localized edema: Secondary | ICD-10-CM | POA: Diagnosis not present

## 2018-11-22 DIAGNOSIS — E291 Testicular hypofunction: Secondary | ICD-10-CM

## 2018-11-22 LAB — CBC WITH DIFFERENTIAL/PLATELET
Basophils Absolute: 0 10*3/uL (ref 0.0–0.1)
Basophils Relative: 0.5 % (ref 0.0–3.0)
Eosinophils Absolute: 0.2 10*3/uL (ref 0.0–0.7)
Eosinophils Relative: 2.6 % (ref 0.0–5.0)
HCT: 43.8 % (ref 39.0–52.0)
Hemoglobin: 14.9 g/dL (ref 13.0–17.0)
Lymphocytes Relative: 21.5 % (ref 12.0–46.0)
Lymphs Abs: 1.7 10*3/uL (ref 0.7–4.0)
MCHC: 34 g/dL (ref 30.0–36.0)
MCV: 85.6 fl (ref 78.0–100.0)
Monocytes Absolute: 0.5 10*3/uL (ref 0.1–1.0)
Monocytes Relative: 5.9 % (ref 3.0–12.0)
Neutro Abs: 5.6 10*3/uL (ref 1.4–7.7)
Neutrophils Relative %: 69.5 % (ref 43.0–77.0)
Platelets: 209 10*3/uL (ref 150.0–400.0)
RBC: 5.12 Mil/uL (ref 4.22–5.81)
RDW: 14.2 % (ref 11.5–15.5)
WBC: 8 10*3/uL (ref 4.0–10.5)

## 2018-11-22 LAB — COMPREHENSIVE METABOLIC PANEL
ALT: 18 U/L (ref 0–53)
AST: 13 U/L (ref 0–37)
Albumin: 4.3 g/dL (ref 3.5–5.2)
Alkaline Phosphatase: 77 U/L (ref 39–117)
BUN: 13 mg/dL (ref 6–23)
CO2: 23 mEq/L (ref 19–32)
Calcium: 8.9 mg/dL (ref 8.4–10.5)
Chloride: 106 mEq/L (ref 96–112)
Creatinine, Ser: 1.03 mg/dL (ref 0.40–1.50)
GFR: 80.79 mL/min (ref >60.00)
Glucose, Bld: 139 mg/dL — ABNORMAL HIGH (ref 70–99)
Potassium: 3.5 mEq/L (ref 3.5–5.1)
Sodium: 140 mEq/L (ref 135–145)
Total Bilirubin: 0.5 mg/dL (ref 0.2–1.2)
Total Protein: 7.2 g/dL (ref 6.0–8.3)

## 2018-11-22 LAB — BRAIN NATRIURETIC PEPTIDE: Pro B Natriuretic peptide (BNP): 32 pg/mL (ref 0.0–100.0)

## 2018-11-22 MED ORDER — HYDROCHLOROTHIAZIDE 25 MG PO TABS: 25.0000 mg | ORAL_TABLET | Freq: Every day | ORAL | 0 refills | Status: DC

## 2018-11-22 NOTE — Progress Notes (Signed)
Herbert Bryant is a 38 y.o. male with the following history as recorded in [employer]:  Patient Active Problem List   Diagnosis Date Noted  . Hypogonadism male 02/22/2018  . HTN (hypertension) 02/22/2018  . Chronic venous insufficiency 02/22/2018  . Moderate tetrahydrocannabinol (THC) dependence ([place]) 01/25/2018  . Sleep disorder 01/25/2018  . Tinea pedis 01/25/2018  . Edema 01/22/2018  . Bipolar depression ([place]) 01/22/2018  . Rash 01/22/2018  . Delirium of mixed origin 07/22/2017  . Acute cystitis with positive culture 01/05/2017  . 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 OSA (obstructive sleep apnea) 03/24/2014  . Morbid obesity ([place]) 12/14/2013  . Allergic rhinitis 12/14/2013    Current Outpatient Medications  Medication Sig Dispense Refill  . Acetylcysteine (NAC PO) Take by mouth.    . ALPRAZolam (XANAX) 0.5 MG tablet Take 0.25-0.5 mg by mouth 3 (three) times daily as needed. Anxiety     . cetirizine (ZYRTEC) 10 MG tablet Take 10 mg by mouth daily.    . Cyanocobalamin (VITAMIN B-12) 500 MCG SUBL Place 1 tablet (500 mcg total) under the tongue 1 day or 1 dose. 100 tablet 3  . escitalopram (LEXAPRO) 20 MG tablet     . finasteride (PROPECIA) 1 MG tablet One quarter tablet daily 30 tablet 5  . fish oil-omega-3 fatty acids 1000 MG capsule Take 1 g by mouth daily.     Marland Kitchen omeprazole (PRILOSEC) 20 MG capsule Take 20 mg by mouth daily.    Marland Kitchen OVER THE COUNTER MEDICATION niagen booster takes on awakening prn    . oxymetazoline (AFRIN) 0.05 % nasal spray Place 1 spray into both nostrils 3 (three) times daily.    . Tasimelteon (HETLIOZ) 20 MG CAPS Take by mouth at bedtime.     . Testosterone (ANDROGEL PUMP) 20.25 MG/ACT (1.62%) GEL Place 3 Act onto the skin every morning. 150 g 5  . traZODone (DESYREL) 50 MG tablet Take 150 mg by mouth at bedtime.  1  . Triamcinolone Acetonide (NASACORT AQ NA) Place into the nose.    . hydrochlorothiazide (HYDRODIURIL) 25 MG tablet Take 1 tablet (25 mg total)  by mouth daily. 90 tablet 0   No current facility-administered medications for this visit.     Allergies: Losartan  Past Medical History:  Diagnosis Date  . Acne   . ADD (attention deficit disorder)   . ADHD (attention deficit hyperactivity disorder)   . Allergy   . Anxiety   . Bipolar 1 disorder ([place])   . Depression   . GERD (gastroesophageal reflux disease)   . IBS (irritable bowel syndrome)     Past Surgical History:  Procedure Laterality Date  . MOUTH SURGERY  1993   skin graft  . WISDOM TOOTH EXTRACTION      Family History  Problem Relation Age of Onset  . Arthritis Maternal Grandmother   . Stroke Maternal Grandmother   . Hypertension Maternal Grandmother   . Alzheimer's disease Maternal Grandmother   . Arthritis Maternal Grandfather   . Alzheimer's disease Maternal Grandfather   . Emphysema Paternal Grandfather   . Alcohol abuse Mother   . Depression Mother   . Skin cancer Mother   . Skin cancer Father     Social History   Tobacco Use  . Smoking status: Former Smoker    Packs/day: 0.50    Types: Cigarettes    Last attempt to quit: 06/25/2012    Years since quitting: 6.4  . Smokeless tobacco: Never Used  Substance Use Topics  .  Alcohol use: No    Alcohol/week: 0.0 standard drinks    Frequency: Never    Comment: occassional    Subjective:  History of hypertension; was tried on Losartan HCT- could not tolerate due to fatigue/ diarrhea; no alternative medication started; blood pressure has consistently been running in the mid 140s/ 80s; feels like his pulse has been higher recently/ feels his heart pounding in his ears; Denies any chest pain, shortness of breath, blurred vision; does feel that he has been having increased headaches recently as well; notices chronic swelling in his extremities- states he actually starts the day with swelling in his ankles and progressively worsens over the day;    Objective:  Vitals:   11/22/18 1317  BP: (!) 146/84  Pulse:  (!) 113  Temp: 97.7 F (36.5 C)  TempSrc: Oral  SpO2: 96%  Weight: (!) 300 lb 3.2 oz (136.2 kg)  Height: _0  (1.778 m)    General: Well developed, well nourished, in no acute distress  Skin : Warm and dry.  Head: Normocephalic and atraumatic  Lungs: Respirations unlabored; clear to auscultation bilaterally without wheeze, rales, rhonchi  CVS exam: normal rate and regular rhythm.  Extremities: bilateral non-pitting bilateral edema of lower extremities, no cyanosis, clubbing  Vessels: Symmetric bilaterally  Neurologic: Alert and oriented; speech intact; face symmetrical; moves all extremities well; CNII-XII intact without focal deficit  Assessment:  1. Essential hypertension   2. Recurrent UTI   3. Pedal edema   4. Hypogonadism male     Plan:  1. Unable to obtain EKG in office today due to our computer system not working properly; check CBC, CMP, BNP today; trial of HCTZ 25 mg daily; follow-up with his PCP in 1 month; 2. Check urine culture; referral to urology per patient request; 3. Referral to cardiology;  4. Check testosterone level; continue Androgel as prescribed.  No follow-ups on file.  Orders Placed This Encounter  Procedures  . Urine Culture    Standing Status:   Future    Number of Occurrences:   1    Standing Expiration Date:   11/22/2019  . CBC w/Diff    Standing Status:   Future    Number of Occurrences:   1    Standing Expiration Date:   11/22/2019  . Comp Met (CMET)    Standing Status:   Future    Number of Occurrences:   1    Standing Expiration Date:   11/22/2019  . B Nat Peptide    Standing Status:   Future    Number of Occurrences:   1    Standing Expiration Date:   11/22/2019  . Testosterone, Free, Total, SHBG    Standing Status:   Future    Number of Occurrences:   1    Standing Expiration Date:   11/22/2019  . Ambulatory referral to Urology    Referral Priority:   Routine    Referral Type:   Consultation    Referral Reason:   Specialty Services  Required    Requested Specialty:   Urology    Number of Visits Requested:   1  . Ambulatory referral to Cardiology    Referral Priority:   Routine    Referral Type:   Consultation    Referral Reason:   Specialty Services Required    Requested Specialty:   Cardiology    Number of Visits Requested:   1    Requested Prescriptions   Signed Prescriptions Disp Refills  .  hydrochlorothiazide (HYDRODIURIL) 25 MG tablet 90 tablet 0    Sig: Take 1 tablet (25 mg total) by mouth daily.

## 2018-11-22 NOTE — Progress Notes (Unsigned)
testesterone

## 2018-11-23 ENCOUNTER — Other Ambulatory Visit (INDEPENDENT_AMBULATORY_CARE_PROVIDER_SITE_OTHER): Payer: BLUE CROSS/BLUE SHIELD

## 2018-11-23 DIAGNOSIS — R7309 Other abnormal glucose: Secondary | ICD-10-CM

## 2018-11-23 LAB — HEMOGLOBIN A1C: Hgb A1c MFr Bld: 5.3 % (ref 4.6–6.5)

## 2018-11-23 LAB — TESTOSTERONE, FREE, TOTAL, SHBG

## 2018-11-23 LAB — URINE CULTURE
MICRO NUMBER:: 462381
Result:: NO GROWTH
SPECIMEN QUALITY:: ADEQUATE

## 2018-11-26 NOTE — Progress Notes (Signed)
Virtual Visit via Video Note   This visit type was conducted due to national recommendations for restrictions regarding the COVID-19 Pandemic (e.g. social distancing) in an effort to limit this patient's exposure and mitigate transmission in our community.  Due to his co-morbid illnesses, this patient is at least at moderate risk for complications without adequate follow up.  This format is felt to be most appropriate for this patient at this time.  All issues noted in this document were discussed and addressed.  A limited physical exam was performed with this format.  Please refer to the patient's chart for his consent to telehealth for [facility].   Date:  11/29/2018   ID:  Herbert Bryant, DOB August 13, 1980, MRN [id number]  Patient Location: Home Provider Location: Home  PCP:  Tresa Garter, MD  Cardiologist:  Dr Jens Som  Evaluation Performed:  Consultation - Melany Guernsey was referred by Ria Clock NP for the evaluation of lower ext edema.  Chief Complaint:  Lower ext edema  History of Present Illness:    Herbert Bryant is a 38 y.o. male with with past medical history of irritable bowel syndrome, bipolar disorder for evaluation of lower extremity edema at request of Ria Clock, FNP.  Laboratories Nov 22, 2018 showed hemoglobin 14.9, creatinine 1.03, BNP 32.  Patient states he has had lower extremity edema for approximately 10 years.  It is not worse recently.  He has dyspnea with more vigorous activities but not routine activities.  No orthopnea or PND.  Occasional chest tightness in the left precordial area without radiation.  Can last 30 minutes at a time.  It is not related to exertion.  No syncope.  Occasional brief palpitations.  Because of the above cardiology asked to evaluate.  The patient does not have symptoms concerning for COVID-19 infection (fever, chills, cough, or new shortness of breath).    Past Medical History:  Diagnosis Date  . Acne   . ADD  (attention deficit disorder)   . ADHD (attention deficit hyperactivity disorder)   . Allergy   . Anxiety   . Bipolar 1 disorder ([facility])   . Depression   . GERD (gastroesophageal reflux disease)   . Hypertension   . IBS (irritable bowel syndrome)    Past Surgical History:  Procedure Laterality Date  . MOUTH SURGERY  1993   skin graft  . WISDOM TOOTH EXTRACTION       Current Meds  Medication Sig  . Acetylcysteine (NAC PO) Take by mouth.  . ALPRAZolam (XANAX) 0.5 MG tablet Take 0.25-0.5 mg by mouth 3 (three) times daily as needed. Anxiety   . cetirizine (ZYRTEC) 10 MG tablet Take 10 mg by mouth daily.  . Cyanocobalamin (VITAMIN B-12) 500 MCG SUBL Place 1 tablet (500 mcg total) under the tongue 1 day or 1 dose.  . escitalopram (LEXAPRO) 20 MG tablet   . finasteride (PROPECIA) 1 MG tablet One quarter tablet daily  . fish oil-omega-3 fatty acids 1000 MG capsule Take 1 g by mouth daily.   . hydrochlorothiazide (HYDRODIURIL) 25 MG tablet Take 1 tablet (25 mg total) by mouth daily.  Marland Kitchen MINOXIDIL, TOPICAL, (ROGAINE EXTRA STRENGTH) 5 % SOLN Apply topically 2 (two) times a day.  . Multiple Minerals-Vitamins (CITRACAL PLUS PO) Take by mouth daily.  Marland Kitchen omeprazole (PRILOSEC) 20 MG capsule Take 20 mg by mouth daily.  Marland Kitchen OVER THE COUNTER MEDICATION niagen booster takes on awakening prn  . oxymetazoline (AFRIN) 0.05 % nasal spray Place  1 spray into both nostrils 3 (three) times daily.  . Tasimelteon (HETLIOZ) 20 MG CAPS Take by mouth at bedtime.   . Testosterone (ANDROGEL PUMP) 20.25 MG/ACT (1.62%) GEL Place 3 Act onto the skin every morning.  . traZODone (DESYREL) 50 MG tablet Take 150 mg by mouth at bedtime.  . Triamcinolone Acetonide (NASACORT AQ NA) Place into the nose.     Allergies:   Losartan   Social History   Tobacco Use  . Smoking status: Former Smoker    Packs/day: 0.50    Types: Cigarettes    Last attempt to quit: 06/25/2012    Years since quitting: 6.4  . Smokeless tobacco:  Never Used  Substance Use Topics  . Alcohol use: Yes    Alcohol/week: 0.0 standard drinks    Frequency: Never    Comment: occasional  . Drug use: Yes    Types: Marijuana    Comment: use on daily basis     Family Hx: The patient's family history includes Alcohol abuse in his mother; Alzheimer's disease in his maternal grandfather and maternal grandmother; Arthritis in his maternal grandfather and maternal grandmother; Depression in his mother; Emphysema in his paternal grandfather; Hypertension in his maternal grandmother; Skin cancer in his father and mother; Stroke in his maternal grandmother.  ROS:   Please see the history of present illness.    No fevers, chills or productive cough.  Some dysuria.  Loss of vision left eye. All other systems reviewed and are negative.  Recent Labs: 01/22/2018: TSH 3.51 11/22/2018: ALT 18; BUN 13; Creatinine, Ser 1.03; Hemoglobin 14.9; Platelets 209.0; Potassium 3.5; Pro B Natriuretic peptide (BNP) 32.0; Sodium 140   Recent Lipid Panel Lab Results  Component Value Date/Time   CHOL 238 (H) 01/02/2017 09:28 AM   TRIG 236 (H) 01/02/2017 09:28 AM   HDL 29 (L) 01/02/2017 09:28 AM   CHOLHDL 8.2 (H) 01/02/2017 09:28 AM   LDLCALC 162 (H) 01/02/2017 09:28 AM    Wt Readings from Last 3 Encounters:  11/29/18 295 lb (133.8 kg)  11/22/18 (!) 300 lb 3.2 oz (136.2 kg)  07/13/18 (!) 316 lb (143.3 kg)     Objective:    Vital Signs:  BP (!) 149/98   Pulse 85   Ht  (1.778 m)   Wt 295 lb (133.8 kg)   BMI 42.33 kg/m    VITAL SIGNS:  reviewed  No acute distress Normal affect Answers questions appropriately Remainder physical examination not performed (telehealth visit; coronavirus pandemic)  ASSESSMENT & PLAN:    1. Hypertension-patient's blood pressure has been elevated.  Add hydrochlorothiazide 25 mg daily.  Check potassium and renal function in 1 week.  Follow blood pressure and advance regimen as needed.  He apparently had some difficulties  with losartan previously. 2. Lower extremity edema-sounds to be chronic venous insufficiency.  Hopefully hydrochlorothiazide will help.  I will arrange an echocardiogram to assess LV and RV function. 3. Chest tightness-symptoms are atypical.  Last electrocardiogram January 2019 normal.  I will arrange an exercise treadmill for risk stratification. 4. Morbid obesity-we discussed the importance of diet, weight loss and exercise.  We will arrange an exercise treadmill in approximately 8 weeks for risk stratification prior to initiating exercise program.  COVID-19 Education: The importance of social distancing was discussed today.  Time:   Today, I have spent 18 minutes with the patient with telehealth technology discussing the above problems.     Medication Adjustments/Labs and Tests Ordered: Current medicines are reviewed at  length with the patient today.  Concerns regarding medicines are outlined above.   Tests Ordered: No orders of the defined types were placed in this encounter.   Medication Changes: No orders of the defined types were placed in this encounter.   Disposition:  Follow up in 4 month(s)  Signed, Olga MillersBrian Greta Yung, MD  11/29/2018 8:44 AM    [facility]

## 2018-11-29 ENCOUNTER — Encounter: Payer: Self-pay | Admitting: Cardiology

## 2018-11-29 ENCOUNTER — Telehealth (INDEPENDENT_AMBULATORY_CARE_PROVIDER_SITE_OTHER): Payer: BLUE CROSS/BLUE SHIELD | Admitting: Cardiology

## 2018-11-29 ENCOUNTER — Encounter: Payer: Self-pay | Admitting: *Deleted

## 2018-11-29 VITALS — BP 149/98 | HR 85 | Ht 70.0 in | Wt 295.0 lb

## 2018-11-29 DIAGNOSIS — R0600 Dyspnea, unspecified: Secondary | ICD-10-CM

## 2018-11-29 DIAGNOSIS — R0789 Other chest pain: Secondary | ICD-10-CM

## 2018-11-29 DIAGNOSIS — R600 Localized edema: Secondary | ICD-10-CM

## 2018-11-29 DIAGNOSIS — I10 Essential (primary) hypertension: Secondary | ICD-10-CM

## 2018-11-29 NOTE — Patient Instructions (Signed)
Medication Instructions:  START HCTZ 25 MG ONCE DAILY If you need a refill on your cardiac medications before your next appointment, please call your pharmacy.   Lab work: Your physician recommends that you return for lab work in: ONE WEEK If you have labs (blood work) drawn today and your tests are completely normal, you will receive your results only by: Marland Kitchen MyChart Message (if you have MyChart) OR . A paper copy in the mail If you have any lab test that is abnormal or we need to change your treatment, we will call you to review the results.  Testing/Procedures: Your physician has requested that you have an exercise tolerance test. For further information please visit [email or web address] Please also follow instruction sheet, as given.  [address]  Your physician has requested that you have an echocardiogram. Echocardiography is a painless test that uses sound waves to create images of your heart. It provides your doctor with information about the size and shape of your heart and how well your heart's chambers and valves are working. This procedure takes approximately one hour. There are no restrictions for this procedure.  [address]  Follow-Up: At [facility], you and your health needs are our priority.  As part of our continuing mission to provide you with exceptional heart care, we have created designated Provider Care Teams.  These Care Teams include your primary Cardiologist (physician) and Advanced Practice Providers (APPs -  Physician Assistants and Nurse Practitioners) who all work together to provide you with the care you need, when you need it. Your physician recommends that you schedule a follow-up appointment in: 4 MONTHS WITH DR Jens Som

## 2018-12-14 ENCOUNTER — Other Ambulatory Visit: Payer: Self-pay

## 2018-12-14 ENCOUNTER — Other Ambulatory Visit (INDEPENDENT_AMBULATORY_CARE_PROVIDER_SITE_OTHER): Payer: BLUE CROSS/BLUE SHIELD

## 2018-12-14 DIAGNOSIS — E291 Testicular hypofunction: Secondary | ICD-10-CM

## 2018-12-14 DIAGNOSIS — N500 Atrophy of testis: Secondary | ICD-10-CM | POA: Diagnosis not present

## 2018-12-14 DIAGNOSIS — N411 Chronic prostatitis: Secondary | ICD-10-CM | POA: Diagnosis not present

## 2018-12-14 DIAGNOSIS — N521 Erectile dysfunction due to diseases classified elsewhere: Secondary | ICD-10-CM | POA: Diagnosis not present

## 2018-12-14 DIAGNOSIS — L723 Sebaceous cyst: Secondary | ICD-10-CM | POA: Diagnosis not present

## 2018-12-14 LAB — BASIC METABOLIC PANEL
BUN: 16 mg/dL (ref 6–23)
CO2: 26 mEq/L (ref 19–32)
Calcium: 8.6 mg/dL (ref 8.4–10.5)
Chloride: 103 mEq/L (ref 96–112)
Creatinine, Ser: 0.97 mg/dL (ref 0.40–1.50)
GFR: 86.55 mL/min (ref >60.00)
Glucose, Bld: 79 mg/dL (ref 70–99)
Potassium: 3.3 mEq/L — ABNORMAL LOW (ref 3.5–5.1)
Sodium: 138 mEq/L (ref 135–145)

## 2018-12-14 NOTE — Addendum Note (Signed)
Addended by: Miguel Aschoff on: 12/14/2018 09:15 AM   Modules accepted: Orders

## 2018-12-15 ENCOUNTER — Telehealth: Payer: Self-pay | Admitting: Cardiology

## 2018-12-15 DIAGNOSIS — R600 Localized edema: Secondary | ICD-10-CM

## 2018-12-15 DIAGNOSIS — Z79899 Other long term (current) drug therapy: Secondary | ICD-10-CM

## 2018-12-15 NOTE — Telephone Encounter (Signed)
Called patient no answer.Unable to leave message mail box full.

## 2018-12-15 NOTE — Telephone Encounter (Signed)
  Pt c/o medication issue:  1. Name of Medication: hydrochlorothiazide (HYDRODIURIL) 25 MG tablet  2. How are you currently taking this medication (dosage and times per day)? Take 1 tablet (25 mg total) by mouth daily.  3. Are you having a reaction (difficulty breathing--STAT)?  Jittery feeling  4. What is your medication issue? Patient says medicine is not helping his swelling, BP or heart rate. He is having a flushing sensation and feels jittery for the first four hours after taking it. He would like to be taken off medication and put on something else.

## 2018-12-15 NOTE — Telephone Encounter (Signed)
Pt has low potassium on labs done yesterday and may be making him feel jittery. Since HCTZ causes flushing and he does not like it, we can stop that and start spironolactone which is a good blood pressure medication and will help to raise his potassium instead of lowering it like HCTZ does. He should eat a banana or drink some orange juice today for extra potassium. He can continue to monitor his BP and let us know how it goes. Recheck BMet in 1-2 weeks after starting spiro.

## 2018-12-15 NOTE — Telephone Encounter (Signed)
Called patient no answer.No voice mail. 

## 2018-12-15 NOTE — Telephone Encounter (Signed)
Returned call to patient he stated since he started on HCTZ 25 mg daily he has seen no improvement in his B/P.He continues to have swelling in both lower legs.Stated 4 hours after taking he gets a flushed feeling and makes him jittery.He wants to be prescribed another medication.Advised Dr.Crenshaw out of office.I will send message to Lizabeth Leyden NP for advice.

## 2018-12-16 MED ORDER — SPIRONOLACTONE 25 MG PO TABS: 25.0000 mg | ORAL_TABLET | Freq: Every day | ORAL | 5 refills | Status: DC

## 2018-12-16 NOTE — Telephone Encounter (Signed)
  Patient is calling back because he said the spirolactone will interfere with his testosterone. Is there something else he can take?

## 2018-12-16 NOTE — Addendum Note (Signed)
Addended by: Lindell Spar on: 12/16/2018 08:32 AM   Modules accepted: Orders

## 2018-12-16 NOTE — Telephone Encounter (Signed)
Addendum: patient has OTC K+ supplement to take as well

## 2018-12-16 NOTE — Telephone Encounter (Signed)
DC HCTZ; lasix 20 mg daily; bmet one week Olga Millers

## 2018-12-16 NOTE — Telephone Encounter (Signed)
Per communication with Elnita Maxwell, LPN (triage nurse 6/3) - NP recommended spironolactone 25mg . Rx(s) sent to pharmacy electronically.

## 2018-12-16 NOTE — Addendum Note (Signed)
Addended by: Lindell Spar on: 12/16/2018 09:11 AM   Modules accepted: Orders

## 2018-12-16 NOTE — Telephone Encounter (Signed)
° ° °  Please return call to patient °

## 2018-12-16 NOTE — Telephone Encounter (Signed)
Returned call to patient. He voiced understanding of NP recommendations and agrees with plan for med change.   Message sent to Coralee North NP to clarify dose of spironolactone and once info received, [facility]   Patient advised to have BMP done 1-2 weeks after med change - this has been ordered.

## 2018-12-16 NOTE — Telephone Encounter (Signed)
Per Coralee North NP, defer to primary cardiologist Dr. Jens Som to advise since spironolactone interacts with testosterone per patient

## 2018-12-17 LAB — TESTOSTERONE, FREE, TOTAL, SHBG
Sex Hormone Binding: 43.4 nmol/L (ref 16.5–55.9)
Testosterone, Free: 11.7 pg/mL (ref 8.7–25.1)
Testosterone: 742 ng/dL (ref 264–916)

## 2018-12-17 MED ORDER — FUROSEMIDE 20 MG PO TABS: 20.0000 mg | ORAL_TABLET | Freq: Every day | ORAL | 3 refills | Status: DC

## 2018-12-17 NOTE — Telephone Encounter (Signed)
Spoke with pt, he never started the spironolactone. HCTZ stopped and new script called to his pharmacy. Lab orders mailed to the pt.

## 2018-12-17 NOTE — Addendum Note (Signed)
Addended by: Freddi Starr on: 12/17/2018 08:39 AM   Modules accepted: Orders

## 2019-01-20 ENCOUNTER — Telehealth: Payer: Self-pay | Admitting: Internal Medicine

## 2019-01-20 DIAGNOSIS — L649 Androgenic alopecia, unspecified: Secondary | ICD-10-CM

## 2019-01-20 MED ORDER — FINASTERIDE 1 MG PO TABS: ORAL_TABLET | ORAL | 3 refills | Status: DC

## 2019-01-20 NOTE — Telephone Encounter (Signed)
Ok Thx 

## 2019-01-20 NOTE — Telephone Encounter (Signed)
Please advise about filling medication

## 2019-01-20 NOTE — Telephone Encounter (Signed)
Copied from [place] [phone]. Topic: Quick Communication - Rx Refill/Question >> Jan 20, 2019 11:15 AM Percell Belt A wrote: Medication: finasteride (PROPECIA) 1 MG tablet [[id number]] Has the patient contacted their pharmacy? No.- previous Dr was prescribing this med for pt  (Agent: If no, request that the patient contact the pharmacy for the refill.) (Agent: If yes, when and what did the pharmacy advise?)  Preferred Pharmacy (with phone number or street name): [id number] [place], [place]: Please be advised that RX refills may take up to 3 business days. We ask that you follow-up with your pharmacy.

## 2019-01-25 ENCOUNTER — Ambulatory Visit (HOSPITAL_COMMUNITY): Payer: BC Managed Care – PPO | Attending: Cardiology

## 2019-02-09 ENCOUNTER — Telehealth: Payer: Self-pay | Admitting: Psychiatry

## 2019-02-09 NOTE — Telephone Encounter (Signed)
I was given a message today to return a call that this patient.  Return telephone call to the listed phone number.  I was routed to voicemail.  Left a message asking him to call back.

## 2019-02-12 ENCOUNTER — Other Ambulatory Visit: Payer: Self-pay | Admitting: Family

## 2019-02-21 DIAGNOSIS — F633 Trichotillomania: Secondary | ICD-10-CM | POA: Diagnosis not present

## 2019-02-21 DIAGNOSIS — F900 Attention-deficit hyperactivity disorder, predominantly inattentive type: Secondary | ICD-10-CM | POA: Diagnosis not present

## 2019-02-21 DIAGNOSIS — G4724 Circadian rhythm sleep disorder, free running type: Secondary | ICD-10-CM | POA: Diagnosis not present

## 2019-02-21 DIAGNOSIS — F330 Major depressive disorder, recurrent, mild: Secondary | ICD-10-CM | POA: Diagnosis not present

## 2019-02-24 DIAGNOSIS — G4724 Circadian rhythm sleep disorder, free running type: Secondary | ICD-10-CM | POA: Diagnosis not present

## 2019-02-24 DIAGNOSIS — F633 Trichotillomania: Secondary | ICD-10-CM | POA: Diagnosis not present

## 2019-02-24 DIAGNOSIS — F330 Major depressive disorder, recurrent, mild: Secondary | ICD-10-CM | POA: Diagnosis not present

## 2019-02-24 DIAGNOSIS — F900 Attention-deficit hyperactivity disorder, predominantly inattentive type: Secondary | ICD-10-CM | POA: Diagnosis not present

## 2019-02-28 DIAGNOSIS — F330 Major depressive disorder, recurrent, mild: Secondary | ICD-10-CM | POA: Diagnosis not present

## 2019-02-28 DIAGNOSIS — G4724 Circadian rhythm sleep disorder, free running type: Secondary | ICD-10-CM | POA: Diagnosis not present

## 2019-02-28 DIAGNOSIS — F900 Attention-deficit hyperactivity disorder, predominantly inattentive type: Secondary | ICD-10-CM | POA: Diagnosis not present

## 2019-02-28 DIAGNOSIS — F633 Trichotillomania: Secondary | ICD-10-CM | POA: Diagnosis not present

## 2019-03-02 ENCOUNTER — Telehealth (HOSPITAL_COMMUNITY): Payer: Self-pay

## 2019-03-02 NOTE — Telephone Encounter (Signed)
New message    Just an FYI. We have made several attempts to contact this patient including sending a letter to schedule or reschedule their echocardiogram & Exercise Tolerance Test. We will be removing the patient from the echo WQ.   Thank you   8.19.20 @ 12:09pm lm on home vm Mamie Diiorio  7.23.20@ 12:26pm lm on home vm- [place] Gustafson Cancel Rsn: Provider (COVID 19/left Pt detailed vm he will be rescheduled at a later date/rval) 7.14.20 no show - Jerilyn Gillaspie

## 2019-03-03 DIAGNOSIS — F330 Major depressive disorder, recurrent, mild: Secondary | ICD-10-CM | POA: Diagnosis not present

## 2019-03-03 DIAGNOSIS — G4724 Circadian rhythm sleep disorder, free running type: Secondary | ICD-10-CM | POA: Diagnosis not present

## 2019-03-03 DIAGNOSIS — F633 Trichotillomania: Secondary | ICD-10-CM | POA: Diagnosis not present

## 2019-03-03 DIAGNOSIS — F900 Attention-deficit hyperactivity disorder, predominantly inattentive type: Secondary | ICD-10-CM | POA: Diagnosis not present

## 2019-03-07 DIAGNOSIS — G4724 Circadian rhythm sleep disorder, free running type: Secondary | ICD-10-CM | POA: Diagnosis not present

## 2019-03-07 DIAGNOSIS — F633 Trichotillomania: Secondary | ICD-10-CM | POA: Diagnosis not present

## 2019-03-07 DIAGNOSIS — F900 Attention-deficit hyperactivity disorder, predominantly inattentive type: Secondary | ICD-10-CM | POA: Diagnosis not present

## 2019-03-07 DIAGNOSIS — F330 Major depressive disorder, recurrent, mild: Secondary | ICD-10-CM | POA: Diagnosis not present

## 2019-03-11 NOTE — Progress Notes (Deleted)
HPI: FU lower ext edema. Also with h/o irritable bowel syndrome, bipolar disorder. Laboratories Nov 22, 2018 showed hemoglobin 14.9, creatinine 1.03, BNP 32.    Patient has longstanding pedal edema.  At last office visit echocardiogram and exercise treadmill were ordered but not performed as of yet.  Current Outpatient Medications  Medication Sig Dispense Refill  . Acetylcysteine (NAC PO) Take by mouth.    . ALPRAZolam (XANAX) 0.5 MG tablet Take 0.25-0.5 mg by mouth 3 (three) times daily as needed. Anxiety     . cetirizine (ZYRTEC) 10 MG tablet Take 10 mg by mouth daily.    . Cyanocobalamin (VITAMIN B-12) 500 MCG SUBL Place 1 tablet (500 mcg total) under the tongue 1 day or 1 dose. 100 tablet 3  . escitalopram (LEXAPRO) 20 MG tablet     . finasteride (PROPECIA) 1 MG tablet One quarter tablet daily 90 tablet 3  . fish oil-omega-3 fatty acids 1000 MG capsule Take 1 g by mouth daily.     . furosemide (LASIX) 20 MG tablet Take 1 tablet (20 mg total) by mouth daily. 90 tablet 3  . MINOXIDIL, TOPICAL, (ROGAINE EXTRA STRENGTH) 5 % SOLN Apply topically 2 (two) times a day.    . Multiple Minerals-Vitamins (CITRACAL PLUS PO) Take by mouth daily.    Marland Kitchen omeprazole (PRILOSEC) 20 MG capsule Take 20 mg by mouth daily.    Marland Kitchen OVER THE COUNTER MEDICATION niagen booster takes on awakening prn    . oxymetazoline (AFRIN) 0.05 % nasal spray Place 1 spray into both nostrils 3 (three) times daily.    . Tasimelteon (HETLIOZ) 20 MG CAPS Take by mouth at bedtime.     . Testosterone (ANDROGEL PUMP) 20.25 MG/ACT (1.62%) GEL Place 3 Act onto the skin every morning. 150 g 5  . traZODone (DESYREL) 50 MG tablet Take 150 mg by mouth at bedtime.  1  . Triamcinolone Acetonide (NASACORT AQ NA) Place into the nose.     No current facility-administered medications for this visit.      Past Medical History:  Diagnosis Date  . Acne   . ADD (attention deficit disorder)   . ADHD (attention deficit hyperactivity  disorder)   . Allergy   . Anxiety   . Bipolar 1 disorder ([place])   . Depression   . GERD (gastroesophageal reflux disease)   . Hypertension   . IBS (irritable bowel syndrome)     Past Surgical History:  Procedure Laterality Date  . MOUTH SURGERY  1993   skin graft  . WISDOM TOOTH EXTRACTION      Social History   Socioeconomic History  . Marital status: Single    Spouse name: Not on file  . Number of children: 0  . Years of education: Not on file  . Highest education level: Bachelor's degree (e.g., BA, AB, BS)  Occupational History  . Not on file  Social Needs  . Financial resource strain: Not hard at all  . Food insecurity    Worry: Never true    Inability: Never true  . Transportation needs    Medical: No    Non-medical: No  Tobacco Use  . Smoking status: Former Smoker    Packs/day: 0.50    Types: Cigarettes    Quit date: 06/25/2012    Years since quitting: 6.7  . Smokeless tobacco: Never Used  Substance and Sexual Activity  . Alcohol use: Yes    Alcohol/week: 0.0 standard drinks    Frequency:  Never    Comment: occasional  . Drug use: Yes    Types: Marijuana    Comment: use on daily basis  . Sexual activity: Yes    Birth control/protection: None  Lifestyle  . Physical activity    Days per week: Not on file    Minutes per session: Not on file  . Stress: To some extent  Relationships  . Social Musicianconnections    Talks on phone: Twice a week    Gets together: Twice a week    Attends religious service: Never    Active member of club or organization: Yes    Attends meetings of clubs or organizations: More than 4 times per year    Relationship status: Never married  . Intimate partner violence    Fear of current or ex partner: No    Emotionally abused: No    Physically abused: No    Forced sexual activity: No  Other Topics Concern  . Not on file  Social History Narrative   Lives at home alone.  He is unemployed.  Veterinary surgeonducation Bachelors.  No children.       Family History  Problem Relation Age of Onset  . Arthritis Maternal Grandmother   . Stroke Maternal Grandmother   . Hypertension Maternal Grandmother   . Alzheimer's disease Maternal Grandmother   . Arthritis Maternal Grandfather   . Alzheimer's disease Maternal Grandfather   . Emphysema Paternal Grandfather   . Alcohol abuse Mother   . Depression Mother   . Skin cancer Mother   . Skin cancer Father     ROS: no fevers or chills, productive cough, hemoptysis, dysphasia, odynophagia, melena, hematochezia, dysuria, hematuria, rash, seizure activity, orthopnea, PND, pedal edema, claudication. Remaining systems are negative.  Physical Exam: Well-developed well-nourished in no acute distress.  Skin is warm and dry.  HEENT is normal.  Neck is supple.  Chest is clear to auscultation with normal expansion.  Cardiovascular exam is regular rate and rhythm.  Abdominal exam nontender or distended. No masses palpated. Extremities show no edema. neuro grossly intact  ECG- personally reviewed  A/P  1 lower extremity edema-this appears to be chronic venous insufficiency.  Hydrochlorothiazide added at last office visit but edema persists.  We discussed the importance of keeping legs elevated and compression hose.  Reschedule echocardiogram.  2 hypertension-patient's blood pressure is controlled.  Continue present medications and follow.  3 chest tightness-as outlined previously symptoms are atypical.  We will reschedule exercise treadmill.  4 morbid obesity-patient needs diet, weight loss and exercise.  Olga MillersBrian Crenshaw, MD

## 2019-03-14 DIAGNOSIS — F900 Attention-deficit hyperactivity disorder, predominantly inattentive type: Secondary | ICD-10-CM | POA: Diagnosis not present

## 2019-03-14 DIAGNOSIS — F633 Trichotillomania: Secondary | ICD-10-CM | POA: Diagnosis not present

## 2019-03-14 DIAGNOSIS — F330 Major depressive disorder, recurrent, mild: Secondary | ICD-10-CM | POA: Diagnosis not present

## 2019-03-14 DIAGNOSIS — G4724 Circadian rhythm sleep disorder, free running type: Secondary | ICD-10-CM | POA: Diagnosis not present

## 2019-03-15 ENCOUNTER — Ambulatory Visit: Payer: BC Managed Care – PPO | Admitting: Cardiology

## 2019-03-15 DIAGNOSIS — F633 Trichotillomania: Secondary | ICD-10-CM | POA: Diagnosis not present

## 2019-03-15 DIAGNOSIS — F900 Attention-deficit hyperactivity disorder, predominantly inattentive type: Secondary | ICD-10-CM | POA: Diagnosis not present

## 2019-03-15 DIAGNOSIS — G4724 Circadian rhythm sleep disorder, free running type: Secondary | ICD-10-CM | POA: Diagnosis not present

## 2019-03-15 DIAGNOSIS — F330 Major depressive disorder, recurrent, mild: Secondary | ICD-10-CM | POA: Diagnosis not present

## 2019-03-17 DIAGNOSIS — G4724 Circadian rhythm sleep disorder, free running type: Secondary | ICD-10-CM | POA: Diagnosis not present

## 2019-03-17 DIAGNOSIS — F330 Major depressive disorder, recurrent, mild: Secondary | ICD-10-CM | POA: Diagnosis not present

## 2019-03-17 DIAGNOSIS — F633 Trichotillomania: Secondary | ICD-10-CM | POA: Diagnosis not present

## 2019-03-17 DIAGNOSIS — F900 Attention-deficit hyperactivity disorder, predominantly inattentive type: Secondary | ICD-10-CM | POA: Diagnosis not present

## 2019-03-23 DIAGNOSIS — F900 Attention-deficit hyperactivity disorder, predominantly inattentive type: Secondary | ICD-10-CM | POA: Diagnosis not present

## 2019-03-23 DIAGNOSIS — F330 Major depressive disorder, recurrent, mild: Secondary | ICD-10-CM | POA: Diagnosis not present

## 2019-03-23 DIAGNOSIS — F633 Trichotillomania: Secondary | ICD-10-CM | POA: Diagnosis not present

## 2019-03-23 DIAGNOSIS — G4724 Circadian rhythm sleep disorder, free running type: Secondary | ICD-10-CM | POA: Diagnosis not present

## 2019-03-28 DIAGNOSIS — F900 Attention-deficit hyperactivity disorder, predominantly inattentive type: Secondary | ICD-10-CM | POA: Diagnosis not present

## 2019-03-28 DIAGNOSIS — F633 Trichotillomania: Secondary | ICD-10-CM | POA: Diagnosis not present

## 2019-03-28 DIAGNOSIS — G4724 Circadian rhythm sleep disorder, free running type: Secondary | ICD-10-CM | POA: Diagnosis not present

## 2019-03-28 DIAGNOSIS — F330 Major depressive disorder, recurrent, mild: Secondary | ICD-10-CM | POA: Diagnosis not present

## 2019-04-08 DIAGNOSIS — F900 Attention-deficit hyperactivity disorder, predominantly inattentive type: Secondary | ICD-10-CM | POA: Diagnosis not present

## 2019-04-08 DIAGNOSIS — F633 Trichotillomania: Secondary | ICD-10-CM | POA: Diagnosis not present

## 2019-04-08 DIAGNOSIS — F330 Major depressive disorder, recurrent, mild: Secondary | ICD-10-CM | POA: Diagnosis not present

## 2019-04-08 DIAGNOSIS — G4724 Circadian rhythm sleep disorder, free running type: Secondary | ICD-10-CM | POA: Diagnosis not present

## 2019-04-08 NOTE — Progress Notes (Deleted)
Virtual Visit via Video Note   This visit type was conducted due to national recommendations for restrictions regarding the COVID-19 Pandemic (e.g. social distancing) in an effort to limit this patient's exposure and mitigate transmission in our community.  Due to his co-morbid illnesses, this patient is at least at moderate risk for complications without adequate follow up.  This format is felt to be most appropriate for this patient at this time.  All issues noted in this document were discussed and addressed.  A limited physical exam was performed with this format.  Please refer to the patient's chart for his consent to telehealth for [facility].   Date:  04/08/2019   ID:  Herbert Bryant, DOB May 20, 1981, MRN [id number]  Patient Location:Home Provider Location: Home  PCP:  Cassandria Anger, MD  Cardiologist:  Dr Stanford Breed  Evaluation Performed:  Follow-Up Visit  Chief Complaint:  FU edema and CP  History of Present Illness:    FU edema and CP.  Laboratories Nov 22, 2018 showed hemoglobin 14.9, creatinine 1.03, BNP 32.    Echocardiogram and exercise treadmill ordered at last office visit but not performed.  Since last seen  The patient does not have symptoms concerning for COVID-19 infection (fever, chills, cough, or new shortness of breath).    Past Medical History:  Diagnosis Date  . Acne   . ADD (attention deficit disorder)   . ADHD (attention deficit hyperactivity disorder)   . Allergy   . Anxiety   . Bipolar 1 disorder ([place])   . Depression   . GERD (gastroesophageal reflux disease)   . Hypertension   . IBS (irritable bowel syndrome)    Past Surgical History:  Procedure Laterality Date  . MOUTH SURGERY  1993   skin graft  . WISDOM TOOTH EXTRACTION       No outpatient medications have been marked as taking for the 04/12/19 encounter (Appointment) with Lelon Perla, MD.     Allergies:   Losartan   Social History   Tobacco Use  . Smoking status:  Former Smoker    Packs/day: 0.50    Types: Cigarettes    Quit date: 06/25/2012    Years since quitting: 6.7  . Smokeless tobacco: Never Used  Substance Use Topics  . Alcohol use: Yes    Alcohol/week: 0.0 standard drinks    Frequency: Never    Comment: occasional  . Drug use: Yes    Types: Marijuana    Comment: use on daily basis     Family Hx: The patient's family history includes Alcohol abuse in his mother; Alzheimer's disease in his maternal grandfather and maternal grandmother; Arthritis in his maternal grandfather and maternal grandmother; Depression in his mother; Emphysema in his paternal grandfather; Hypertension in his maternal grandmother; Skin cancer in his father and mother; Stroke in his maternal grandmother.  ROS:   Please see the history of present illness.    No Fever, chills  or productive cough All other systems reviewed and are negative.   Recent Labs: 11/22/2018: ALT 18; Hemoglobin 14.9; Platelets 209.0; Pro B Natriuretic peptide (BNP) 32.0 12/14/2018: BUN 16; Creatinine, Ser 0.97; Potassium 3.3; Sodium 138   Recent Lipid Panel Lab Results  Component Value Date/Time   CHOL 238 (H) 01/02/2017 09:28 AM   TRIG 236 (H) 01/02/2017 09:28 AM   HDL 29 (L) 01/02/2017 09:28 AM   CHOLHDL 8.2 (H) 01/02/2017 09:28 AM   LDLCALC 162 (H) 01/02/2017 09:28 AM    Wt Readings  from Last 3 Encounters:  11/29/18 295 lb (133.8 kg)  11/22/18 (!) 300 lb 3.2 oz (136.2 kg)  07/13/18 (!) 316 lb (143.3 kg)     Objective:    Vital Signs:  There were no vitals taken for this visit.   VITAL SIGNS:  reviewed NAD Answers questions appropriately Normal affect Remainder of physical examination not performed (telehealth visit; coronavirus pandemic)  ASSESSMENT & PLAN:    1. Hypertension-patient's blood pressure is controlled.  Continue present medications and follow. 2. Chronic lower extremity edema-this is felt likely secondary to venous insufficiency.  We discussed keeping feet  elevated and potential for compression hose.  We will rearrange echocardiogram to evaluate LV and RV function. 3. Chest tightness-exercise treadmill will be reordered. 4. Morbid obesity-we discussed the importance of diet, exercise and weight loss.  COVID-19 Education: The importance of social distancing was discussed today.  Time:   Today, I have spent 16 minutes with the patient with telehealth technology discussing the above problems.     Medication Adjustments/Labs and Tests Ordered: Current medicines are reviewed at length with the patient today.  Concerns regarding medicines are outlined above.   Tests Ordered: No orders of the defined types were placed in this encounter.   Medication Changes: No orders of the defined types were placed in this encounter.   Follow Up:  Virtual Visit or In Person in 1 year(s)  Signed, Olga Millers, MD  04/08/2019 7:24 AM    [facility]

## 2019-04-11 DIAGNOSIS — G4724 Circadian rhythm sleep disorder, free running type: Secondary | ICD-10-CM | POA: Diagnosis not present

## 2019-04-11 DIAGNOSIS — F900 Attention-deficit hyperactivity disorder, predominantly inattentive type: Secondary | ICD-10-CM | POA: Diagnosis not present

## 2019-04-11 DIAGNOSIS — F633 Trichotillomania: Secondary | ICD-10-CM | POA: Diagnosis not present

## 2019-04-11 DIAGNOSIS — F330 Major depressive disorder, recurrent, mild: Secondary | ICD-10-CM | POA: Diagnosis not present

## 2019-04-12 ENCOUNTER — Encounter: Payer: Self-pay | Admitting: Cardiology

## 2019-04-12 ENCOUNTER — Telehealth (INDEPENDENT_AMBULATORY_CARE_PROVIDER_SITE_OTHER): Payer: BC Managed Care – PPO | Admitting: Cardiology

## 2019-04-12 ENCOUNTER — Telehealth: Payer: BC Managed Care – PPO | Admitting: Cardiology

## 2019-04-12 VITALS — BP 135/72 | HR 84 | Ht 70.0 in | Wt 304.0 lb

## 2019-04-12 DIAGNOSIS — R0789 Other chest pain: Secondary | ICD-10-CM

## 2019-04-12 DIAGNOSIS — R600 Localized edema: Secondary | ICD-10-CM

## 2019-04-12 DIAGNOSIS — I10 Essential (primary) hypertension: Secondary | ICD-10-CM

## 2019-04-12 NOTE — Progress Notes (Signed)
Virtual Visit via Telephone Note   This visit type was conducted due to national recommendations for restrictions regarding the COVID-19 Pandemic (e.g. social distancing) in an effort to limit this patient's exposure and mitigate transmission in our community.  Due to his co-morbid illnesses, this patient is at least at moderate risk for complications without adequate follow up.  This format is felt to be most appropriate for this patient at this time.  The patient did not have access to video technology/had technical difficulties with video requiring transitioning to audio format only (telephone).  All issues noted in this document were discussed and addressed.  No physical exam could be performed with this format.  Please refer to the patient's chart for his  consent to telehealth for [facility].   Date:  04/12/2019   ID:  Herbert Bryant, DOB Jan 04, 1981, MRN [id number]  Patient Location:Home Provider Location: Home  PCP:  Cassandria Anger, MD  Cardiologist:  Dr Stanford Breed  Evaluation Performed:  Follow-Up Visit  Chief Complaint:  FU edema  History of Present Illness:    FU edema. Laboratories Nov 22, 2018 showed hemoglobin 14.9, creatinine 1.03, BNP 32.  Patient states he has had lower extremity edema for approximately 10 years. Echocardiogram and exercise treadmill ordered at last office visit but not performed.  Since last seen he has dyspnea with more vigorous activities.  No orthopnea or PND.  Pedal edema persists.  He has not had chest pain or syncope recently.  Has a history of nonexertional chest pain.  The patient does not have symptoms concerning for COVID-19 infection (fever, chills, cough, or new shortness of breath).   The patient does not have symptoms concerning for COVID-19 infection (fever, chills, cough, or new shortness of breath).    Past Medical History:  Diagnosis Date  . Acne   . ADD (attention deficit disorder)   . ADHD (attention deficit hyperactivity  disorder)   . Allergy   . Anxiety   . Bipolar 1 disorder ([place])   . Depression   . GERD (gastroesophageal reflux disease)   . Hypertension   . IBS (irritable bowel syndrome)    Past Surgical History:  Procedure Laterality Date  . MOUTH SURGERY  1993   skin graft  . WISDOM TOOTH EXTRACTION       Current Meds  Medication Sig  . Acetylcysteine (NAC PO) Take by mouth.  . ALPRAZolam (XANAX) 0.5 MG tablet Take 0.25-0.5 mg by mouth 3 (three) times daily as needed. Anxiety   . cetirizine (ZYRTEC) 10 MG tablet Take 10 mg by mouth daily.  . Cyanocobalamin (VITAMIN B-12) 500 MCG SUBL Place 1 tablet (500 mcg total) under the tongue 1 day or 1 dose.  Marland Kitchen doxycycline (VIBRA-TABS) 100 MG tablet Take 100 mg by mouth 2 (two) times daily.  Marland Kitchen escitalopram (LEXAPRO) 20 MG tablet   . finasteride (PROPECIA) 1 MG tablet One quarter tablet daily  . fish oil-omega-3 fatty acids 1000 MG capsule Take 1 g by mouth daily.   . furosemide (LASIX) 20 MG tablet Take 1 tablet (20 mg total) by mouth daily.  Marland Kitchen MINOXIDIL, TOPICAL, (ROGAINE EXTRA STRENGTH) 5 % SOLN Apply topically 2 (two) times a day.  . Multiple Minerals-Vitamins (CITRACAL PLUS PO) Take by mouth daily.  Marland Kitchen omeprazole (PRILOSEC) 20 MG capsule Take 20 mg by mouth daily.  Marland Kitchen oxymetazoline (AFRIN) 0.05 % nasal spray Place 1 spray into both nostrils 3 (three) times daily.  . Tasimelteon (HETLIOZ) 20 MG CAPS Take  by mouth at bedtime.   . Testosterone (ANDROGEL PUMP) 20.25 MG/ACT (1.62%) GEL Place 3 Act onto the skin every morning.  . traZODone (DESYREL) 50 MG tablet Take 150 mg by mouth at bedtime.  . Triamcinolone Acetonide (NASACORT AQ NA) Place into the nose.     Allergies:   Losartan   Social History   Tobacco Use  . Smoking status: Former Smoker    Packs/day: 0.50    Types: Cigarettes    Quit date: 06/25/2012    Years since quitting: 6.8  . Smokeless tobacco: Never Used  Substance Use Topics  . Alcohol use: Yes    Alcohol/week: 0.0 standard  drinks    Frequency: Never    Comment: occasional  . Drug use: Yes    Types: Marijuana    Comment: use on daily basis     Family Hx: The patient's family history includes Alcohol abuse in his mother; Alzheimer's disease in his maternal grandfather and maternal grandmother; Arthritis in his maternal grandfather and maternal grandmother; Depression in his mother; Emphysema in his paternal grandfather; Hypertension in his maternal grandmother; Skin cancer in his father and mother; Stroke in his maternal grandmother.  ROS:   Please see the history of present illness.    No Fever, chills  or productive cough; patient describes attention deficit disorder and sleep disorder. All other systems reviewed and are negative.   Recent Labs: 11/22/2018: ALT 18; Hemoglobin 14.9; Platelets 209.0; Pro B Natriuretic peptide (BNP) 32.0 12/14/2018: BUN 16; Creatinine, Ser 0.97; Potassium 3.3; Sodium 138   Recent Lipid Panel Lab Results  Component Value Date/Time   CHOL 238 (H) 01/02/2017 09:28 AM   TRIG 236 (H) 01/02/2017 09:28 AM   HDL 29 (L) 01/02/2017 09:28 AM   CHOLHDL 8.2 (H) 01/02/2017 09:28 AM   LDLCALC 162 (H) 01/02/2017 09:28 AM    Wt Readings from Last 3 Encounters:  04/12/19 (!) 304 lb (137.9 kg)  11/29/18 295 lb (133.8 kg)  11/22/18 (!) 300 lb 3.2 oz (136.2 kg)     Objective:    Vital Signs:  BP 135/72   Pulse 84   Ht 5\' 10"  (1.778 m)   Wt (!) 304 lb (137.9 kg)   BMI 43.62 kg/m   NAD Answers questions appropriately Normal affect Remainder of physical examination not performed (telehealth visit; coronavirus pandemic)  ASSESSMENT & PLAN:    1. Hypertension-blood pressure reasonably well controlled.  Continue present dose of diuretic and follow.  Check potassium and renal function. 2. Lower extremity edema-this is felt likely secondary to chronic venous insufficiency.  However we will rearrange echocardiogram to assess LV and RV function.  We discussed keeping his feet elevated  and compression hose. 3. Chest pain-history of nonexertional chest pain but none recently.  Arrange exercise treadmill. 4. Morbid obesity-we again discussed the importance of diet, exercise and weight loss.  COVID-19 Education: The importance of social distancing was discussed today.  Time:   Today, I have spent 19 minutes with the patient with telehealth technology discussing the above problems.     Medication Adjustments/Labs and Tests Ordered: Current medicines are reviewed at length with the patient today.  Concerns regarding medicines are outlined above.   Tests Ordered: No orders of the defined types were placed in this encounter.   Medication Changes: No orders of the defined types were placed in this encounter.   Follow Up:  In Person December as scheduled  Signed, January, MD  04/12/2019 8:20 AM  [facility]

## 2019-04-12 NOTE — Patient Instructions (Signed)
Medication Instructions:  NO CHANGE If you need a refill on your cardiac medications before your next appointment, please call your pharmacy.   Lab work: Your physician recommends that you return for lab work WHEN CONVENIENT   If you have labs (blood work) drawn today and your tests are completely normal, you will receive your results only by: Marland Kitchen MyChart Message (if you have MyChart) OR . A paper copy in the mail If you have any lab test that is abnormal or we need to change your treatment, we will call you to review the results.  Testing/Procedures: Your physician has requested that you have an echocardiogram. Echocardiography is a painless test that uses sound waves to create images of your heart. It provides your doctor with information about the size and shape of your heart and how well your heart's chambers and valves are working. This procedure takes approximately one hour. There are no restrictions for this procedure.[place] has requested that you have an exercise tolerance test. For further information please visit [email or web address]. Please also follow instruction sheet, as given.    Follow-Up: At [facility], you and your health needs are our priority.  As part of our continuing mission to provide you with exceptional heart care, we have created designated Provider Care Teams.  These Care Teams include your primary Cardiologist (physician) and Advanced Practice Providers (APPs -  Physician Assistants and Nurse Practitioners) who all work together to provide you with the care you need, when you need it. Your physician recommends that you schedule a follow-up appointment in: AS SCHEDULED

## 2019-04-14 DIAGNOSIS — F633 Trichotillomania: Secondary | ICD-10-CM | POA: Diagnosis not present

## 2019-04-14 DIAGNOSIS — G4724 Circadian rhythm sleep disorder, free running type: Secondary | ICD-10-CM | POA: Diagnosis not present

## 2019-04-14 DIAGNOSIS — F900 Attention-deficit hyperactivity disorder, predominantly inattentive type: Secondary | ICD-10-CM | POA: Diagnosis not present

## 2019-04-14 DIAGNOSIS — F330 Major depressive disorder, recurrent, mild: Secondary | ICD-10-CM | POA: Diagnosis not present

## 2019-04-18 ENCOUNTER — Other Ambulatory Visit: Payer: Self-pay

## 2019-04-18 ENCOUNTER — Ambulatory Visit (HOSPITAL_COMMUNITY): Payer: BC Managed Care – PPO | Attending: Cardiology

## 2019-04-18 ENCOUNTER — Telehealth: Payer: Self-pay | Admitting: Cardiology

## 2019-04-18 DIAGNOSIS — R0789 Other chest pain: Secondary | ICD-10-CM | POA: Diagnosis not present

## 2019-04-18 NOTE — Telephone Encounter (Signed)
° ° °  Patient calling states he needs his echo results "right now", he saw results on MyChart and wants a call back.

## 2019-04-18 NOTE — Telephone Encounter (Signed)
Notes recorded by Lelon Perla, MD on 04/18/2019 at 11:33 AM EDT  Normal LV function.  Brin Crenshaw   Pt informed of providers result & recommendations. Pt verbalized understanding. No further questions .

## 2019-04-19 DIAGNOSIS — F633 Trichotillomania: Secondary | ICD-10-CM | POA: Diagnosis not present

## 2019-04-19 DIAGNOSIS — F330 Major depressive disorder, recurrent, mild: Secondary | ICD-10-CM | POA: Diagnosis not present

## 2019-04-19 DIAGNOSIS — G4724 Circadian rhythm sleep disorder, free running type: Secondary | ICD-10-CM | POA: Diagnosis not present

## 2019-04-19 DIAGNOSIS — F900 Attention-deficit hyperactivity disorder, predominantly inattentive type: Secondary | ICD-10-CM | POA: Diagnosis not present

## 2019-04-25 DIAGNOSIS — F330 Major depressive disorder, recurrent, mild: Secondary | ICD-10-CM | POA: Diagnosis not present

## 2019-04-25 DIAGNOSIS — F633 Trichotillomania: Secondary | ICD-10-CM | POA: Diagnosis not present

## 2019-04-25 DIAGNOSIS — F900 Attention-deficit hyperactivity disorder, predominantly inattentive type: Secondary | ICD-10-CM | POA: Diagnosis not present

## 2019-04-25 DIAGNOSIS — G4724 Circadian rhythm sleep disorder, free running type: Secondary | ICD-10-CM | POA: Diagnosis not present

## 2019-04-28 DIAGNOSIS — G4724 Circadian rhythm sleep disorder, free running type: Secondary | ICD-10-CM | POA: Diagnosis not present

## 2019-04-28 DIAGNOSIS — F330 Major depressive disorder, recurrent, mild: Secondary | ICD-10-CM | POA: Diagnosis not present

## 2019-04-28 DIAGNOSIS — F633 Trichotillomania: Secondary | ICD-10-CM | POA: Diagnosis not present

## 2019-04-28 DIAGNOSIS — F900 Attention-deficit hyperactivity disorder, predominantly inattentive type: Secondary | ICD-10-CM | POA: Diagnosis not present

## 2019-05-02 DIAGNOSIS — G4724 Circadian rhythm sleep disorder, free running type: Secondary | ICD-10-CM | POA: Diagnosis not present

## 2019-05-02 DIAGNOSIS — F900 Attention-deficit hyperactivity disorder, predominantly inattentive type: Secondary | ICD-10-CM | POA: Diagnosis not present

## 2019-05-02 DIAGNOSIS — F330 Major depressive disorder, recurrent, mild: Secondary | ICD-10-CM | POA: Diagnosis not present

## 2019-05-02 DIAGNOSIS — F633 Trichotillomania: Secondary | ICD-10-CM | POA: Diagnosis not present

## 2019-05-05 DIAGNOSIS — F900 Attention-deficit hyperactivity disorder, predominantly inattentive type: Secondary | ICD-10-CM | POA: Diagnosis not present

## 2019-05-05 DIAGNOSIS — F330 Major depressive disorder, recurrent, mild: Secondary | ICD-10-CM | POA: Diagnosis not present

## 2019-05-05 DIAGNOSIS — F633 Trichotillomania: Secondary | ICD-10-CM | POA: Diagnosis not present

## 2019-05-05 DIAGNOSIS — G4724 Circadian rhythm sleep disorder, free running type: Secondary | ICD-10-CM | POA: Diagnosis not present

## 2019-05-06 ENCOUNTER — Telehealth (HOSPITAL_COMMUNITY): Payer: Self-pay

## 2019-05-06 NOTE — Telephone Encounter (Signed)
Encounter complete. 

## 2019-05-07 ENCOUNTER — Other Ambulatory Visit (HOSPITAL_COMMUNITY)
Admission: RE | Admit: 2019-05-07 | Discharge: 2019-05-07 | Disposition: A | Discharge status: Home / Self Care | Payer: BC Managed Care – PPO | Source: Ambulatory Visit | Attending: Cardiology | Admitting: Cardiology

## 2019-05-07 DIAGNOSIS — Z01812 Encounter for preprocedural laboratory examination: Secondary | ICD-10-CM | POA: Insufficient documentation

## 2019-05-07 DIAGNOSIS — Z20828 Contact with and (suspected) exposure to other viral communicable diseases: Secondary | ICD-10-CM | POA: Insufficient documentation

## 2019-05-08 LAB — NOVEL CORONAVIRUS, NAA (HOSP ORDER, SEND-OUT TO REF LAB; TAT 18-24 HRS): SARS-CoV-2, NAA: NOT DETECTED

## 2019-05-09 DIAGNOSIS — F633 Trichotillomania: Secondary | ICD-10-CM | POA: Diagnosis not present

## 2019-05-09 DIAGNOSIS — F900 Attention-deficit hyperactivity disorder, predominantly inattentive type: Secondary | ICD-10-CM | POA: Diagnosis not present

## 2019-05-09 DIAGNOSIS — G4724 Circadian rhythm sleep disorder, free running type: Secondary | ICD-10-CM | POA: Diagnosis not present

## 2019-05-09 DIAGNOSIS — F330 Major depressive disorder, recurrent, mild: Secondary | ICD-10-CM | POA: Diagnosis not present

## 2019-05-11 ENCOUNTER — Other Ambulatory Visit: Payer: Self-pay

## 2019-05-11 ENCOUNTER — Ambulatory Visit (HOSPITAL_COMMUNITY)
Admission: RE | Admit: 2019-05-11 | Discharge: 2019-05-11 | Disposition: A | Discharge status: Home / Self Care | Payer: BC Managed Care – PPO | Source: Ambulatory Visit | Attending: Cardiology | Admitting: Cardiology

## 2019-05-11 DIAGNOSIS — R0789 Other chest pain: Secondary | ICD-10-CM | POA: Insufficient documentation

## 2019-05-11 LAB — EXERCISE TOLERANCE TEST
Estimated workload: 7 METS
Exercise duration (min): 5 min
Exercise duration (sec): 14 s
MPHR: 182 {beats}/min
Peak HR: 176 {beats}/min
Percent HR: 96 %
RPE: 18
Rest HR: 87 {beats}/min

## 2019-05-12 DIAGNOSIS — F633 Trichotillomania: Secondary | ICD-10-CM | POA: Diagnosis not present

## 2019-05-12 DIAGNOSIS — G4724 Circadian rhythm sleep disorder, free running type: Secondary | ICD-10-CM | POA: Diagnosis not present

## 2019-05-12 DIAGNOSIS — F900 Attention-deficit hyperactivity disorder, predominantly inattentive type: Secondary | ICD-10-CM | POA: Diagnosis not present

## 2019-05-12 DIAGNOSIS — F330 Major depressive disorder, recurrent, mild: Secondary | ICD-10-CM | POA: Diagnosis not present

## 2019-05-16 DIAGNOSIS — F330 Major depressive disorder, recurrent, mild: Secondary | ICD-10-CM | POA: Diagnosis not present

## 2019-05-16 DIAGNOSIS — F900 Attention-deficit hyperactivity disorder, predominantly inattentive type: Secondary | ICD-10-CM | POA: Diagnosis not present

## 2019-05-16 DIAGNOSIS — G4724 Circadian rhythm sleep disorder, free running type: Secondary | ICD-10-CM | POA: Diagnosis not present

## 2019-05-16 DIAGNOSIS — F633 Trichotillomania: Secondary | ICD-10-CM | POA: Diagnosis not present

## 2019-05-17 ENCOUNTER — Telehealth: Payer: Self-pay | Admitting: Internal Medicine

## 2019-05-17 DIAGNOSIS — R600 Localized edema: Secondary | ICD-10-CM | POA: Diagnosis not present

## 2019-05-17 DIAGNOSIS — E291 Testicular hypofunction: Secondary | ICD-10-CM

## 2019-05-18 DIAGNOSIS — F633 Trichotillomania: Secondary | ICD-10-CM | POA: Diagnosis not present

## 2019-05-18 DIAGNOSIS — F332 Major depressive disorder, recurrent severe without psychotic features: Secondary | ICD-10-CM | POA: Diagnosis not present

## 2019-05-18 DIAGNOSIS — F900 Attention-deficit hyperactivity disorder, predominantly inattentive type: Secondary | ICD-10-CM | POA: Diagnosis not present

## 2019-05-18 DIAGNOSIS — G4724 Circadian rhythm sleep disorder, free running type: Secondary | ICD-10-CM | POA: Diagnosis not present

## 2019-05-18 LAB — BASIC METABOLIC PANEL
BUN/Creatinine Ratio: 14 (ref 9–20)
BUN: 14 mg/dL (ref 6–20)
CO2: 23 mmol/L (ref 20–29)
Calcium: 8.9 mg/dL (ref 8.7–10.2)
Chloride: 104 mmol/L (ref 96–106)
Creatinine, Ser: 1.03 mg/dL (ref 0.76–1.27)
GFR calc Af Amer: 106 mL/min/{1.73_m2} (ref >59)
GFR calc non Af Amer: 92 mL/min/{1.73_m2} (ref >59)
Glucose: 76 mg/dL (ref 65–99)
Potassium: 4.4 mmol/L (ref 3.5–5.2)
Sodium: 139 mmol/L (ref 134–144)

## 2019-05-19 DIAGNOSIS — F341 Dysthymic disorder: Secondary | ICD-10-CM | POA: Diagnosis not present

## 2019-05-20 ENCOUNTER — Other Ambulatory Visit: Payer: Self-pay | Admitting: Internal Medicine

## 2019-05-20 DIAGNOSIS — E291 Testicular hypofunction: Secondary | ICD-10-CM

## 2019-05-20 NOTE — Telephone Encounter (Signed)
Pt has scheduled a medication refill OV, but his medication will lapse before then.  Pt wants to know if he can have enough to make it through to OV.

## 2019-05-23 DIAGNOSIS — G4724 Circadian rhythm sleep disorder, free running type: Secondary | ICD-10-CM | POA: Diagnosis not present

## 2019-05-23 DIAGNOSIS — F633 Trichotillomania: Secondary | ICD-10-CM | POA: Diagnosis not present

## 2019-05-23 DIAGNOSIS — F332 Major depressive disorder, recurrent severe without psychotic features: Secondary | ICD-10-CM | POA: Diagnosis not present

## 2019-05-23 DIAGNOSIS — F900 Attention-deficit hyperactivity disorder, predominantly inattentive type: Secondary | ICD-10-CM | POA: Diagnosis not present

## 2019-05-23 NOTE — Telephone Encounter (Signed)
Pt calling to f/up on this request.  Please advise.  °

## 2019-05-23 NOTE — Telephone Encounter (Signed)
Please advise about refill, pt has appt scheduled

## 2019-05-25 DIAGNOSIS — F900 Attention-deficit hyperactivity disorder, predominantly inattentive type: Secondary | ICD-10-CM | POA: Diagnosis not present

## 2019-05-25 DIAGNOSIS — F633 Trichotillomania: Secondary | ICD-10-CM | POA: Diagnosis not present

## 2019-05-25 DIAGNOSIS — G4724 Circadian rhythm sleep disorder, free running type: Secondary | ICD-10-CM | POA: Diagnosis not present

## 2019-05-25 DIAGNOSIS — F332 Major depressive disorder, recurrent severe without psychotic features: Secondary | ICD-10-CM | POA: Diagnosis not present

## 2019-05-25 MED ORDER — TESTOSTERONE 20.25 MG/ACT (1.62%) TD GEL: 3.0000 | TRANSDERMAL | 0 refills | Status: DC

## 2019-05-25 NOTE — Telephone Encounter (Signed)
Pt want to know if he can have a short supply until his appointment, please advise

## 2019-05-25 NOTE — Telephone Encounter (Signed)
Patient checking on the status of 05/20/2019 Testosterone (ANDROGEL PUMP) 20.25 MG/ACT (1.62%) GEL refill request, patient has a scheduled appointment for 05/31/2019 and will run out today. Patient would like a follow up call today please leave a detail message    [id number] - [place], [place] - [address]

## 2019-05-25 NOTE — Addendum Note (Signed)
Addended by: Cassandria Anger on: 05/25/2019 01:43 PM   Modules accepted: Orders

## 2019-05-25 NOTE — Telephone Encounter (Signed)
Ok Thx 

## 2019-05-30 DIAGNOSIS — F633 Trichotillomania: Secondary | ICD-10-CM | POA: Diagnosis not present

## 2019-05-30 DIAGNOSIS — G4724 Circadian rhythm sleep disorder, free running type: Secondary | ICD-10-CM | POA: Diagnosis not present

## 2019-05-30 DIAGNOSIS — F332 Major depressive disorder, recurrent severe without psychotic features: Secondary | ICD-10-CM | POA: Diagnosis not present

## 2019-05-30 DIAGNOSIS — F900 Attention-deficit hyperactivity disorder, predominantly inattentive type: Secondary | ICD-10-CM | POA: Diagnosis not present

## 2019-05-31 ENCOUNTER — Ambulatory Visit (INDEPENDENT_AMBULATORY_CARE_PROVIDER_SITE_OTHER): Payer: BC Managed Care – PPO | Admitting: Internal Medicine

## 2019-05-31 ENCOUNTER — Other Ambulatory Visit: Payer: Self-pay

## 2019-05-31 ENCOUNTER — Encounter: Payer: Self-pay | Admitting: Internal Medicine

## 2019-05-31 VITALS — BP 134/86 | HR 103 | Temp 98.7°F | Ht 70.0 in | Wt 297.0 lb

## 2019-05-31 DIAGNOSIS — I10 Essential (primary) hypertension: Secondary | ICD-10-CM | POA: Diagnosis not present

## 2019-05-31 DIAGNOSIS — E291 Testicular hypofunction: Secondary | ICD-10-CM

## 2019-05-31 DIAGNOSIS — F319 Bipolar disorder, unspecified: Secondary | ICD-10-CM | POA: Diagnosis not present

## 2019-05-31 MED ORDER — TESTOSTERONE 20.25 MG/ACT (1.62%) TD GEL: 3.0000 | TRANSDERMAL | 5 refills | Status: DC

## 2019-05-31 NOTE — Assessment & Plan Note (Signed)
Diet 

## 2019-05-31 NOTE — Assessment & Plan Note (Signed)
Cont the gel Labs

## 2019-05-31 NOTE — Assessment & Plan Note (Signed)
Stress test and ECHO were ok in 2020 Furosemide 

## 2019-05-31 NOTE — Assessment & Plan Note (Signed)
On Wellbutruin now - new

## 2019-05-31 NOTE — Patient Instructions (Signed)

## 2019-05-31 NOTE — Progress Notes (Signed)
Subjective:  Patient ID: Herbert Bryant, male    DOB: 03-Apr-1981  Age: 38 y.o. MRN: [id number]  CC: No chief complaint on file.   HPI Herbert Bryant presents for hypogonadism C/o fatigue Stress test and ECHO were ok in 2020  Outpatient Medications Prior to Visit  Medication Sig Dispense Refill  . Acetylcysteine (NAC PO) Take by mouth.    . ALPRAZolam (XANAX) 0.5 MG tablet Take 0.25-0.5 mg by mouth 3 (three) times daily as needed. Anxiety     . buPROPion (WELLBUTRIN XL) 150 MG 24 hr tablet Take 150 mg by mouth every morning.    . cetirizine (ZYRTEC) 10 MG tablet Take 10 mg by mouth daily.    . Cyanocobalamin (VITAMIN B-12) 500 MCG SUBL Place 1 tablet (500 mcg total) under the tongue 1 day or 1 dose. 100 tablet 3  . doxycycline (VIBRA-TABS) 100 MG tablet Take 100 mg by mouth 2 (two) times daily.    Marland Kitchen escitalopram (LEXAPRO) 20 MG tablet     . finasteride (PROPECIA) 1 MG tablet One quarter tablet daily 90 tablet 3  . fish oil-omega-3 fatty acids 1000 MG capsule Take 1 g by mouth daily.     Marland Kitchen MINOXIDIL, TOPICAL, (ROGAINE EXTRA STRENGTH) 5 % SOLN Apply topically 2 (two) times a day.    . Multiple Minerals-Vitamins (CITRACAL PLUS PO) Take by mouth daily.    Marland Kitchen omeprazole (PRILOSEC) 20 MG capsule Take 20 mg by mouth daily.    Marland Kitchen oxymetazoline (AFRIN) 0.05 % nasal spray Place 1 spray into both nostrils 3 (three) times daily.    . Tasimelteon (HETLIOZ) 20 MG CAPS Take by mouth at bedtime.     . Testosterone (ANDROGEL PUMP) 20.25 MG/ACT (1.62%) GEL Place 3 Act onto the skin every morning. 150 g 0  . traZODone (DESYREL) 50 MG tablet Take 150 mg by mouth at bedtime.  1  . Triamcinolone Acetonide (NASACORT AQ NA) Place into the nose.    . furosemide (LASIX) 20 MG tablet Take 1 tablet (20 mg total) by mouth daily. 90 tablet 3   No facility-administered medications prior to visit.     ROS: Review of Systems  Constitutional: Positive for fatigue. Negative for appetite change and unexpected  weight change.  HENT: Negative for congestion, nosebleeds, sneezing, sore throat and trouble swallowing.   Eyes: Negative for itching and visual disturbance.  Respiratory: Negative for cough.   Cardiovascular: Negative for chest pain, palpitations and leg swelling.  Gastrointestinal: Negative for abdominal distention, blood in stool, diarrhea and nausea.  Genitourinary: Negative for frequency and hematuria.  Musculoskeletal: Negative for back pain, gait problem, joint swelling and neck pain.  Skin: Negative for rash.  Neurological: Negative for dizziness, tremors, speech difficulty and weakness.  Psychiatric/Behavioral: Positive for dysphoric mood and sleep disturbance. Negative for agitation. The patient is not nervous/anxious.     Objective:  BP 134/86 (BP Location: Left Arm, Patient Position: Sitting, Cuff Size: Large)   Pulse (!) 103   Temp 98.7 F (37.1 C) (Oral)   Ht 5\' 10"  (1.778 m)   Wt 297 lb (134.7 kg)   SpO2 96%   BMI 42.62 kg/m   BP Readings from Last 3 Encounters:  05/31/19 134/86  04/12/19 135/72  11/29/18 (!) 149/98    Wt Readings from Last 3 Encounters:  05/31/19 297 lb (134.7 kg)  04/12/19 (!) 304 lb (137.9 kg)  11/29/18 295 lb (133.8 kg)    Physical Exam Constitutional:  General: He is not in acute distress.    Appearance: He is well-developed. He is obese.     Comments: NAD  Eyes:     Conjunctiva/sclera: Conjunctivae normal.     Pupils: Pupils are equal, round, and reactive to light.  Neck:     Musculoskeletal: Normal range of motion.     Thyroid: No thyromegaly.     Vascular: No JVD.  Cardiovascular:     Rate and Rhythm: Normal rate and regular rhythm.     Heart sounds: Normal heart sounds. No murmur. No friction rub. No gallop.   Pulmonary:     Effort: Pulmonary effort is normal. No respiratory distress.     Breath sounds: Normal breath sounds. No wheezing or rales.  Chest:     Chest wall: No tenderness.  Abdominal:     General: Bowel  sounds are normal. There is no distension.     Palpations: Abdomen is soft. There is no mass.     Tenderness: There is no abdominal tenderness. There is no guarding or rebound.  Musculoskeletal: Normal range of motion.        General: No tenderness.  Lymphadenopathy:     Cervical: No cervical adenopathy.  Skin:    General: Skin is warm and dry.     Findings: No rash.  Neurological:     Mental Status: He is alert and oriented to person, place, and time.     Cranial Nerves: No cranial nerve deficit.     Motor: No abnormal muscle tone.     Coordination: Coordination normal.     Gait: Gait normal.     Deep Tendon Reflexes: Reflexes are normal and symmetric.  Psychiatric:        Behavior: Behavior normal.        Thought Content: Thought content normal.        Judgment: Judgment normal.     Lab Results  Component Value Date   WBC 8.0 11/22/2018   HGB 14.9 11/22/2018   HCT 43.8 11/22/2018   PLT 209.0 11/22/2018   GLUCOSE 76 05/17/2019   CHOL 238 (H) 01/02/2017   TRIG 236 (H) 01/02/2017   HDL 29 (L) 01/02/2017   LDLCALC 162 (H) 01/02/2017   ALT 18 11/22/2018   AST 13 11/22/2018   NA 139 05/17/2019   K 4.4 05/17/2019   CL 104 05/17/2019   CREATININE 1.03 05/17/2019   BUN 14 05/17/2019   CO2 23 05/17/2019   TSH 3.51 01/22/2018   HGBA1C 5.3 11/23/2018    No results found.  Assessment & Plan:   There are no diagnoses linked to this encounter.   No orders of the defined types were placed in this encounter.    Follow-up: No follow-ups on file.  Walker Kehr, MD

## 2019-06-03 ENCOUNTER — Other Ambulatory Visit (INDEPENDENT_AMBULATORY_CARE_PROVIDER_SITE_OTHER): Payer: BC Managed Care – PPO

## 2019-06-03 DIAGNOSIS — I10 Essential (primary) hypertension: Secondary | ICD-10-CM | POA: Diagnosis not present

## 2019-06-03 DIAGNOSIS — E291 Testicular hypofunction: Secondary | ICD-10-CM

## 2019-06-03 LAB — CBC WITH DIFFERENTIAL/PLATELET
Basophils Absolute: 0 10*3/uL (ref 0.0–0.1)
Basophils Relative: 0.4 % (ref 0.0–3.0)
Eosinophils Absolute: 0.2 10*3/uL (ref 0.0–0.7)
Eosinophils Relative: 1.9 % (ref 0.0–5.0)
HCT: 45.9 % (ref 39.0–52.0)
Hemoglobin: 15.3 g/dL (ref 13.0–17.0)
Lymphocytes Relative: 24.3 % (ref 12.0–46.0)
Lymphs Abs: 2.6 10*3/uL (ref 0.7–4.0)
MCHC: 33.4 g/dL (ref 30.0–36.0)
MCV: 83.7 fl (ref 78.0–100.0)
Monocytes Absolute: 0.6 10*3/uL (ref 0.1–1.0)
Monocytes Relative: 5.7 % (ref 3.0–12.0)
Neutro Abs: 7.2 10*3/uL (ref 1.4–7.7)
Neutrophils Relative %: 67.7 % (ref 43.0–77.0)
Platelets: 225 10*3/uL (ref 150.0–400.0)
RBC: 5.48 Mil/uL (ref 4.22–5.81)
RDW: 14.4 % (ref 11.5–15.5)
WBC: 10.7 10*3/uL — ABNORMAL HIGH (ref 4.0–10.5)

## 2019-06-03 LAB — URINALYSIS
Bilirubin Urine: NEGATIVE
Hgb urine dipstick: NEGATIVE
Ketones, ur: NEGATIVE
Leukocytes,Ua: NEGATIVE
Nitrite: NEGATIVE
Specific Gravity, Urine: >=1.03 — AB (ref 1.000–1.030)
Total Protein, Urine: NEGATIVE
Urine Glucose: NEGATIVE
Urobilinogen, UA: 0.2 (ref 0.0–1.0)
pH: 6 (ref 5.0–8.0)

## 2019-06-03 LAB — HEPATIC FUNCTION PANEL
ALT: 15 U/L (ref 0–53)
AST: 11 U/L (ref 0–37)
Albumin: 4.4 g/dL (ref 3.5–5.2)
Alkaline Phosphatase: 91 U/L (ref 39–117)
Bilirubin, Direct: 0 mg/dL (ref 0.0–0.3)
Total Bilirubin: 0.3 mg/dL (ref 0.2–1.2)
Total Protein: 7.1 g/dL (ref 6.0–8.3)

## 2019-06-03 LAB — LIPID PANEL
Cholesterol: 220 mg/dL — ABNORMAL HIGH (ref 0–200)
HDL: 36.2 mg/dL — ABNORMAL LOW (ref >39.00)
LDL Cholesterol: 152 mg/dL — ABNORMAL HIGH (ref 0–99)
NonHDL: 183.5
Total CHOL/HDL Ratio: 6
Triglycerides: 156 mg/dL — ABNORMAL HIGH (ref 0.0–149.0)
VLDL: 31.2 mg/dL (ref 0.0–40.0)

## 2019-06-03 LAB — TESTOSTERONE: Testosterone: 1025.72 ng/dL — ABNORMAL HIGH (ref 300.00–890.00)

## 2019-06-03 LAB — TSH: TSH: 2.93 u[IU]/mL (ref 0.35–4.50)

## 2019-06-03 LAB — PSA: PSA: 0.47 ng/mL (ref 0.10–4.00)

## 2019-06-06 ENCOUNTER — Telehealth: Payer: Self-pay | Admitting: Internal Medicine

## 2019-06-06 DIAGNOSIS — G4724 Circadian rhythm sleep disorder, free running type: Secondary | ICD-10-CM | POA: Diagnosis not present

## 2019-06-06 DIAGNOSIS — F332 Major depressive disorder, recurrent severe without psychotic features: Secondary | ICD-10-CM | POA: Diagnosis not present

## 2019-06-06 DIAGNOSIS — F900 Attention-deficit hyperactivity disorder, predominantly inattentive type: Secondary | ICD-10-CM | POA: Diagnosis not present

## 2019-06-06 DIAGNOSIS — F633 Trichotillomania: Secondary | ICD-10-CM | POA: Diagnosis not present

## 2019-06-06 NOTE — Telephone Encounter (Signed)
Patient is calling to speak to a nurse regarding scheduling another Testosterone test. Patient believe that it is higher than the test actually showed.  Patient applied the testosterone 24 hours before the test was taken. Please advise. Cb- [phone]

## 2019-06-08 ENCOUNTER — Other Ambulatory Visit: Payer: Self-pay | Admitting: Internal Medicine

## 2019-06-08 DIAGNOSIS — E291 Testicular hypofunction: Secondary | ICD-10-CM

## 2019-06-08 NOTE — Telephone Encounter (Signed)
Please advise 

## 2019-06-08 NOTE — Telephone Encounter (Signed)
pls see mychart message thx

## 2019-06-13 DIAGNOSIS — G4724 Circadian rhythm sleep disorder, free running type: Secondary | ICD-10-CM | POA: Diagnosis not present

## 2019-06-13 DIAGNOSIS — F900 Attention-deficit hyperactivity disorder, predominantly inattentive type: Secondary | ICD-10-CM | POA: Diagnosis not present

## 2019-06-13 DIAGNOSIS — F633 Trichotillomania: Secondary | ICD-10-CM | POA: Diagnosis not present

## 2019-06-13 DIAGNOSIS — F332 Major depressive disorder, recurrent severe without psychotic features: Secondary | ICD-10-CM | POA: Diagnosis not present

## 2019-06-20 DIAGNOSIS — F900 Attention-deficit hyperactivity disorder, predominantly inattentive type: Secondary | ICD-10-CM | POA: Diagnosis not present

## 2019-06-20 DIAGNOSIS — F633 Trichotillomania: Secondary | ICD-10-CM | POA: Diagnosis not present

## 2019-06-20 DIAGNOSIS — G4724 Circadian rhythm sleep disorder, free running type: Secondary | ICD-10-CM | POA: Diagnosis not present

## 2019-06-20 DIAGNOSIS — F332 Major depressive disorder, recurrent severe without psychotic features: Secondary | ICD-10-CM | POA: Diagnosis not present

## 2019-06-30 ENCOUNTER — Other Ambulatory Visit (INDEPENDENT_AMBULATORY_CARE_PROVIDER_SITE_OTHER): Payer: BC Managed Care – PPO

## 2019-06-30 DIAGNOSIS — F633 Trichotillomania: Secondary | ICD-10-CM | POA: Diagnosis not present

## 2019-06-30 DIAGNOSIS — E291 Testicular hypofunction: Secondary | ICD-10-CM

## 2019-06-30 DIAGNOSIS — F900 Attention-deficit hyperactivity disorder, predominantly inattentive type: Secondary | ICD-10-CM | POA: Diagnosis not present

## 2019-06-30 DIAGNOSIS — G4724 Circadian rhythm sleep disorder, free running type: Secondary | ICD-10-CM | POA: Diagnosis not present

## 2019-06-30 DIAGNOSIS — F332 Major depressive disorder, recurrent severe without psychotic features: Secondary | ICD-10-CM | POA: Diagnosis not present

## 2019-06-30 LAB — BASIC METABOLIC PANEL
BUN: 13 mg/dL (ref 6–23)
CO2: 25 mEq/L (ref 19–32)
Calcium: 8.7 mg/dL (ref 8.4–10.5)
Chloride: 105 mEq/L (ref 96–112)
Creatinine, Ser: 0.9 mg/dL (ref 0.40–1.50)
GFR: 94.09 mL/min (ref >60.00)
Glucose, Bld: 112 mg/dL — ABNORMAL HIGH (ref 70–99)
Potassium: 3.7 mEq/L (ref 3.5–5.1)
Sodium: 138 mEq/L (ref 135–145)

## 2019-06-30 LAB — LIPID PANEL
Cholesterol: 185 mg/dL (ref 0–200)
HDL: 37.3 mg/dL — ABNORMAL LOW (ref >39.00)
LDL Cholesterol: 123 mg/dL — ABNORMAL HIGH (ref 0–99)
NonHDL: 147.76
Total CHOL/HDL Ratio: 5
Triglycerides: 123 mg/dL (ref 0.0–149.0)
VLDL: 24.6 mg/dL (ref 0.0–40.0)

## 2019-06-30 LAB — TESTOSTERONE: Testosterone: 501.58 ng/dL (ref 300.00–890.00)

## 2019-07-01 NOTE — Progress Notes (Signed)
Virtual Visit via Video Note changed to phone visit at patient request   This visit type was conducted due to national recommendations for restrictions regarding the COVID-19 Pandemic (e.g. social distancing) in an effort to limit this patient's exposure and mitigate transmission in our community.  Due to his co-morbid illnesses, this patient is at least at moderate risk for complications without adequate follow up.  This format is felt to be most appropriate for this patient at this time.  All issues noted in this document were discussed and addressed.  A limited physical exam was performed with this format.  Please refer to the patient's chart for his consent to telehealth for  [facility].    HPI: FU edema. Laboratories Nov 22, 2018 showed hemoglobin 14.9, creatinine 1.03, BNP 32.Patient states he has had lower extremity edema for approximately 10 years. Echocardiogram October 2020 showed normal LV function, mild left ventricular hypertrophy, grade 1 diastolic dysfunction. Exercise treadmill October 2020 normal.  Since last seen he denies dyspnea.  Occasional sharp chest pain that is not related to exertion.  Chronic mild pedal edema that is worse at night.  Current Outpatient Medications  Medication Sig Dispense Refill  . Acetylcysteine (NAC PO) Take by mouth.    . ALPRAZolam (XANAX) 0.5 MG tablet Take 0.25-0.5 mg by mouth 3 (three) times daily as needed. Anxiety     . buPROPion (WELLBUTRIN XL) 300 MG 24 hr tablet Take 300 mg by mouth every morning.    . cetirizine (ZYRTEC) 10 MG tablet Take 10 mg by mouth daily.    . Cyanocobalamin (VITAMIN B-12) 500 MCG SUBL Place 1 tablet (500 mcg total) under the tongue 1 day or 1 dose. 100 tablet 3  . doxycycline (VIBRA-TABS) 100 MG tablet Take 100 mg by mouth 2 (two) times daily.    Marland Kitchen. escitalopram (LEXAPRO) 20 MG tablet     . finasteride (PROPECIA) 1 MG tablet One quarter tablet daily 90 tablet 3  . fish oil-omega-3 fatty acids 1000 MG capsule  Take 1 g by mouth daily.     Marland Kitchen. MINOXIDIL, TOPICAL, (ROGAINE EXTRA STRENGTH) 5 % SOLN Apply topically 2 (two) times a day.    . Multiple Minerals-Vitamins (CITRACAL PLUS PO) Take by mouth daily.    Marland Kitchen. omeprazole (PRILOSEC) 20 MG capsule Take 20 mg by mouth daily.    Marland Kitchen. oxymetazoline (AFRIN) 0.05 % nasal spray Place 1 spray into both nostrils 3 (three) times daily.    . Tasimelteon (HETLIOZ) 20 MG CAPS Take by mouth at bedtime.     . Testosterone (ANDROGEL PUMP) 20.25 MG/ACT (1.62%) GEL Place 3 Act onto the skin every morning. 150 g 5  . traZODone (DESYREL) 50 MG tablet Take 150 mg by mouth at bedtime.  1  . Triamcinolone Acetonide (NASACORT AQ NA) Place into the nose.    . furosemide (LASIX) 20 MG tablet Take 1 tablet (20 mg total) by mouth daily. 90 tablet 3   No current facility-administered medications for this visit.     Past Medical History:  Diagnosis Date  . Acne   . ADD (attention deficit disorder)   . ADHD (attention deficit hyperactivity disorder)   . Allergy   . Anxiety   . Bipolar 1 disorder (HCC)   . Depression   . GERD (gastroesophageal reflux disease)   . Hypertension   . IBS (irritable bowel syndrome)     Past Surgical History:  Procedure Laterality Date  . MOUTH SURGERY  1993   skin graft  .  WISDOM TOOTH EXTRACTION      Social History   Socioeconomic History  . Marital status: Single    Spouse name: Not on file  . Number of children: 0  . Years of education: Not on file  . Highest education level: Bachelor's degree (e.g., BA, AB, BS)  Occupational History  . Not on file  Tobacco Use  . Smoking status: Former Smoker    Packs/day: 0.50    Types: Cigarettes    Quit date: 06/25/2012    Years since quitting: 7.0  . Smokeless tobacco: Never Used  Substance and Sexual Activity  . Alcohol use: Yes    Alcohol/week: 0.0 standard drinks    Comment: occasional  . Drug use: Yes    Types: Marijuana    Comment: use on daily basis  . Sexual activity: Yes     Birth control/protection: None  Other Topics Concern  . Not on file  Social History Narrative   Lives at home alone.  He is unemployed.  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     Social Determinants of Health   Financial Resource Strain:   . Difficulty of Paying Living Expenses: Not on file  Food Insecurity:   . Worried About Programme researcher, broadcasting/film/video in the Last Year: Not on file  . Ran Out of Food in the Last Year: Not on file  Transportation Needs:   . Lack of Transportation (Medical): Not on file  . Lack of Transportation (Non-Medical): Not on file  Physical Activity:   . Days of Exercise per Week: Not on file  . Minutes of Exercise per Session: Not on file  Stress:   . Feeling of Stress : Not on file  Social Connections:   . Frequency of Communication with Friends and Family: Not on file  . Frequency of Social Gatherings with Friends and Family: Not on file  . Attends Religious Services: Not on file  . Active Member of Clubs or Organizations: Not on file  . Attends Banker Meetings: Not on file  . Marital Status: Not on file  Intimate Partner Violence:   . Fear of Current or Ex-Partner: Not on file  . Emotionally Abused: Not on file  . Physically Abused: Not on file  . Sexually Abused: Not on file    Family History  Problem Relation Age of Onset  . Arthritis Maternal Grandmother   . Stroke Maternal Grandmother   . Hypertension Maternal Grandmother   . Alzheimer's disease Maternal Grandmother   . Arthritis Maternal Grandfather   . Alzheimer's disease Maternal Grandfather   . Emphysema Paternal Grandfather   . Alcohol abuse Mother   . Depression Mother   . Skin cancer Mother   . Skin cancer Father     ROS: no fevers or chills, productive cough, hemoptysis, dysphasia, odynophagia, melena, hematochezia, dysuria, hematuria, rash, seizure activity, orthopnea, PND, claudication. Remaining systems are negative.  Physical Exam: Answers questions  appropriately Normal affect No acute distress Remainder physical examination not performed (coronavirus pandemic; telehealth visit)  A/P  1 chest pain-patient has occasional atypical chest pain.  Recent exercise treadmill not suggestive of ischemia.  No plans for further evaluation at this time.  2 hypertension-blood pressure mildly elevated.  However he is scheduled to start Intuniv for ADHD.  This can lower blood pressure.  We will follow blood pressure after this is initiated and add additional medications if needed.  3 lower extremity edema-felt secondary to chronic venous insufficiency.  Echocardiogram shows normal  LV function.  Continue feet elevation and compression hose.  4 morbid obesity-we discussed the importance of weight loss.   COVID-19 Education: The importance of social distancing was discussed today.  Time:   Today, I have spent 16 minutes with the patient with telehealth technology discussing the above problems.     Medication Adjustments/Labs and Tests Ordered: Current medicines are reviewed at length with the patient today.  Concerns regarding medicines are outlined above.   Tests Ordered: No orders of the defined types were placed in this encounter.   Medication Changes: No orders of the defined types were placed in this encounter.   Follow Up:  Either In Person or Virtual in 1 year(s)  Signed, Kirk Ruths, MD  07/01/2019 8:00 AM    [place]

## 2019-07-04 DIAGNOSIS — F900 Attention-deficit hyperactivity disorder, predominantly inattentive type: Secondary | ICD-10-CM | POA: Diagnosis not present

## 2019-07-04 DIAGNOSIS — F332 Major depressive disorder, recurrent severe without psychotic features: Secondary | ICD-10-CM | POA: Diagnosis not present

## 2019-07-04 DIAGNOSIS — G4724 Circadian rhythm sleep disorder, free running type: Secondary | ICD-10-CM | POA: Diagnosis not present

## 2019-07-04 DIAGNOSIS — F633 Trichotillomania: Secondary | ICD-10-CM | POA: Diagnosis not present

## 2019-07-06 ENCOUNTER — Telehealth (INDEPENDENT_AMBULATORY_CARE_PROVIDER_SITE_OTHER): Payer: BC Managed Care – PPO | Admitting: Cardiology

## 2019-07-06 ENCOUNTER — Encounter: Payer: Self-pay | Admitting: Cardiology

## 2019-07-06 VITALS — BP 135/91 | HR 101 | Ht 70.0 in | Wt 299.0 lb

## 2019-07-06 DIAGNOSIS — R600 Localized edema: Secondary | ICD-10-CM

## 2019-07-06 DIAGNOSIS — I10 Essential (primary) hypertension: Secondary | ICD-10-CM

## 2019-07-06 DIAGNOSIS — R0789 Other chest pain: Secondary | ICD-10-CM

## 2019-07-06 NOTE — Patient Instructions (Signed)
Medication Instructions:  NO CHANGE *If you need a refill on your cardiac medications before your next appointment, please call your pharmacy*  Lab Work: If you have labs (blood work) drawn today and your tests are completely normal, you will receive your results only by: . MyChart Message (if you have MyChart) OR . A paper copy in the mail If you have any lab test that is abnormal or we need to change your treatment, we will call you to review the results.  Follow-Up: At [facility], you and your health needs are our priority.  As part of our continuing mission to provide you with exceptional heart care, we have created designated Provider Care Teams.  These Care Teams include your primary Cardiologist (physician) and Advanced Practice Providers (APPs -  Physician Assistants and Nurse Practitioners) who all work together to provide you with the care you need, when you need it.  Your next appointment:   6 month(s)  The format for your next appointment:   Either In Person or Virtual  Provider:   You may see BRIAN CRENSHAW MD or one of the following Advanced Practice Providers on your designated Care Team:    Luke Kilroy, PA-C  Callie Goodrich, PA-C  Jesse Cleaver, FNP    

## 2019-07-11 DIAGNOSIS — F900 Attention-deficit hyperactivity disorder, predominantly inattentive type: Secondary | ICD-10-CM | POA: Diagnosis not present

## 2019-07-11 DIAGNOSIS — F633 Trichotillomania: Secondary | ICD-10-CM | POA: Diagnosis not present

## 2019-07-11 DIAGNOSIS — G4724 Circadian rhythm sleep disorder, free running type: Secondary | ICD-10-CM | POA: Diagnosis not present

## 2019-07-11 DIAGNOSIS — F332 Major depressive disorder, recurrent severe without psychotic features: Secondary | ICD-10-CM | POA: Diagnosis not present

## 2019-07-18 DIAGNOSIS — F332 Major depressive disorder, recurrent severe without psychotic features: Secondary | ICD-10-CM | POA: Diagnosis not present

## 2019-07-18 DIAGNOSIS — G4724 Circadian rhythm sleep disorder, free running type: Secondary | ICD-10-CM | POA: Diagnosis not present

## 2019-07-18 DIAGNOSIS — F900 Attention-deficit hyperactivity disorder, predominantly inattentive type: Secondary | ICD-10-CM | POA: Diagnosis not present

## 2019-07-18 DIAGNOSIS — F633 Trichotillomania: Secondary | ICD-10-CM | POA: Diagnosis not present

## 2019-07-25 DIAGNOSIS — F633 Trichotillomania: Secondary | ICD-10-CM | POA: Diagnosis not present

## 2019-07-25 DIAGNOSIS — G4724 Circadian rhythm sleep disorder, free running type: Secondary | ICD-10-CM | POA: Diagnosis not present

## 2019-07-25 DIAGNOSIS — F900 Attention-deficit hyperactivity disorder, predominantly inattentive type: Secondary | ICD-10-CM | POA: Diagnosis not present

## 2019-07-25 DIAGNOSIS — F332 Major depressive disorder, recurrent severe without psychotic features: Secondary | ICD-10-CM | POA: Diagnosis not present

## 2019-08-01 DIAGNOSIS — F332 Major depressive disorder, recurrent severe without psychotic features: Secondary | ICD-10-CM | POA: Diagnosis not present

## 2019-08-01 DIAGNOSIS — F633 Trichotillomania: Secondary | ICD-10-CM | POA: Diagnosis not present

## 2019-08-01 DIAGNOSIS — F900 Attention-deficit hyperactivity disorder, predominantly inattentive type: Secondary | ICD-10-CM | POA: Diagnosis not present

## 2019-08-01 DIAGNOSIS — G4724 Circadian rhythm sleep disorder, free running type: Secondary | ICD-10-CM | POA: Diagnosis not present

## 2019-08-04 DIAGNOSIS — F332 Major depressive disorder, recurrent severe without psychotic features: Secondary | ICD-10-CM | POA: Diagnosis not present

## 2019-08-04 DIAGNOSIS — F900 Attention-deficit hyperactivity disorder, predominantly inattentive type: Secondary | ICD-10-CM | POA: Diagnosis not present

## 2019-08-04 DIAGNOSIS — G4724 Circadian rhythm sleep disorder, free running type: Secondary | ICD-10-CM | POA: Diagnosis not present

## 2019-08-04 DIAGNOSIS — F633 Trichotillomania: Secondary | ICD-10-CM | POA: Diagnosis not present

## 2019-08-08 DIAGNOSIS — F900 Attention-deficit hyperactivity disorder, predominantly inattentive type: Secondary | ICD-10-CM | POA: Diagnosis not present

## 2019-08-08 DIAGNOSIS — G4724 Circadian rhythm sleep disorder, free running type: Secondary | ICD-10-CM | POA: Diagnosis not present

## 2019-08-08 DIAGNOSIS — F341 Dysthymic disorder: Secondary | ICD-10-CM | POA: Diagnosis not present

## 2019-08-08 DIAGNOSIS — F332 Major depressive disorder, recurrent severe without psychotic features: Secondary | ICD-10-CM | POA: Diagnosis not present

## 2019-08-08 DIAGNOSIS — F633 Trichotillomania: Secondary | ICD-10-CM | POA: Diagnosis not present

## 2019-08-15 DIAGNOSIS — F633 Trichotillomania: Secondary | ICD-10-CM | POA: Diagnosis not present

## 2019-08-15 DIAGNOSIS — G4724 Circadian rhythm sleep disorder, free running type: Secondary | ICD-10-CM | POA: Diagnosis not present

## 2019-08-15 DIAGNOSIS — F332 Major depressive disorder, recurrent severe without psychotic features: Secondary | ICD-10-CM | POA: Diagnosis not present

## 2019-08-15 DIAGNOSIS — F900 Attention-deficit hyperactivity disorder, predominantly inattentive type: Secondary | ICD-10-CM | POA: Diagnosis not present

## 2019-09-06 DIAGNOSIS — N5201 Erectile dysfunction due to arterial insufficiency: Secondary | ICD-10-CM | POA: Diagnosis not present

## 2019-09-12 DIAGNOSIS — G4724 Circadian rhythm sleep disorder, free running type: Secondary | ICD-10-CM | POA: Diagnosis not present

## 2019-09-12 DIAGNOSIS — F332 Major depressive disorder, recurrent severe without psychotic features: Secondary | ICD-10-CM | POA: Diagnosis not present

## 2019-09-12 DIAGNOSIS — F633 Trichotillomania: Secondary | ICD-10-CM | POA: Diagnosis not present

## 2019-09-12 DIAGNOSIS — F900 Attention-deficit hyperactivity disorder, predominantly inattentive type: Secondary | ICD-10-CM | POA: Diagnosis not present

## 2019-09-26 ENCOUNTER — Encounter: Payer: Self-pay | Admitting: Counselor

## 2019-10-27 ENCOUNTER — Encounter: Payer: BC Managed Care – PPO | Admitting: Counselor

## 2019-11-03 ENCOUNTER — Encounter: Payer: BC Managed Care – PPO | Admitting: Counselor

## 2019-11-16 DIAGNOSIS — L821 Other seborrheic keratosis: Secondary | ICD-10-CM | POA: Diagnosis not present

## 2019-11-16 DIAGNOSIS — Z411 Encounter for cosmetic surgery: Secondary | ICD-10-CM | POA: Diagnosis not present

## 2019-11-16 DIAGNOSIS — L719 Rosacea, unspecified: Secondary | ICD-10-CM | POA: Diagnosis not present

## 2019-11-16 DIAGNOSIS — L309 Dermatitis, unspecified: Secondary | ICD-10-CM | POA: Diagnosis not present

## 2019-11-29 ENCOUNTER — Other Ambulatory Visit: Payer: Self-pay | Admitting: Internal Medicine

## 2019-11-29 DIAGNOSIS — E291 Testicular hypofunction: Secondary | ICD-10-CM

## 2019-12-13 ENCOUNTER — Encounter: Payer: Self-pay | Admitting: Counselor

## 2019-12-13 ENCOUNTER — Other Ambulatory Visit: Payer: Self-pay

## 2019-12-13 ENCOUNTER — Ambulatory Visit (INDEPENDENT_AMBULATORY_CARE_PROVIDER_SITE_OTHER): Payer: BC Managed Care – PPO | Admitting: Counselor

## 2019-12-13 ENCOUNTER — Ambulatory Visit: Payer: BC Managed Care – PPO

## 2019-12-13 DIAGNOSIS — F329 Major depressive disorder, single episode, unspecified: Secondary | ICD-10-CM | POA: Diagnosis not present

## 2019-12-13 DIAGNOSIS — F09 Unspecified mental disorder due to known physiological condition: Secondary | ICD-10-CM

## 2019-12-13 DIAGNOSIS — F32A Depression, unspecified: Secondary | ICD-10-CM

## 2019-12-13 NOTE — Progress Notes (Signed)
° °  Psychometrist Note   Cognitive testing was administered to Herbert Bryant by Lamar Benes, B.S. (Technician) under the supervision of Alphonzo Severance, Psy.D., ABN. [name] was able to tolerate all test procedures. Dr. Nicole Kindred met with the patient as needed to manage any emotional reactions to the testing procedures (if applicable). Rest breaks were offered.    The battery of tests administered was selected by Dr. Nicole Kindred with consideration to the patient's current level of functioning, the nature of his symptoms, emotional and behavioral responses during the interview, level of literacy, observed level of motivation/effort, and the nature of the referral question. This battery was communicated to the psychometrist. Communication between Dr. Nicole Kindred and the psychometrist was ongoing throughout the evaluation and Dr. Nicole Kindred was immediately accessible at all times. Dr. Nicole Kindred provided supervision to the technician on the date of this service, to the extent necessary to assure the quality of all services provided.    [name] will return in approximately one week for an interactive feedback session with Dr. Nicole Kindred, at which time male test performance, clinical impressions, and treatment recommendations will be reviewed in detail. The patient understands he can contact our office should he require our assistance before this time.   A total of 135 minutes of billable time were spent with Herbert Bryant by the technician, including test administration and scoring time. Billing for these services is reflected in Dr. Les Pou note.   This note reflects time spent with the psychometrician and does not include test scores, clinical history, or any interpretations made by Dr. Nicole Kindred. The full report will follow in a separate note.

## 2019-12-13 NOTE — Progress Notes (Signed)
NEUROPSYCHOLOGICAL EVALUATION [facility]  Patient Name: MANNIX KROEKER MRN: [id number] Date of Birth: 1981/02/15 Age: 39 y.o. Education: 16 years  Referral Circumstances and Background Information  Mr. Boody is a 39 y.o., right-hand dominant, single man with a history of memory and thinking problems that he has noticed since ECT. He had a total of 7 ECT treatments (bilateral), with Dr. Toni Amend, which was eventually discontinued related to no improvement as per chart review. As per Mr. Conery, he thinks he may have gotten some improvement from ECT although "it wasn't worth the costs." He is reporting fairly significant symptoms since then and feels like he "exists in the moment" and his short term memory is "massively impaired." He has specifically noticed things such as difficulties having conversations and following the thread of a conversation, problems storytelling when Live Action Roleplaying (LARP), and he thinks there have been changes in his level of skill with respect to videogames. He feels as though he is quite debilitated, as a result. He feels that his long term memory is ok. He also reported that he feels like his handwriting is sloppier than it used to be, he feels like he has vision loss in his left eye (he has consulted with [facility] and they don't know what it was). He stated that anyone who he has regular contact with him has noticed the changes. He stated that he has a slight feeling of dissociation since ECT, as though he is "back from himself" a little bit. 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   With respect to mood, the patient stated that he "can't imagine living the rest of my life like this," and he feels sad. It sounds like he has persistent negative feelings about himself. He has had issues with depression for most of his life, although it seems now that these issues have found a focus in terms of  his dissatisfaction with his perceived current cognitive functioning. He has been following with Dr. Evelene Croon for many years. He has intermittently been involved in therapy; he was last involved in August of last year, although he discontinued two months ago after a disagreement with his therapist that lead him to feel like therapy was no longer a safe place for him. He has a fair amount of regret that he decided to get ECT although he was unclear about whether he was angry and stated he doesn't have good access to anger. It sounds like he feels hopeless and helpless about his current circumstances. He was unable to summarize his sleep and stated he sleeps anywhere from 2 to 30 hours, usually 9 to 11 hours, and it is sporadic. 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 He was evaluated with Dr. Frances Furbish in 2019 who felt he did not have sleep apnea after a sleep study. He has tried a number of different medications for depression in addition to ECT and his condition has been refractory to all the interventions that have been tried. He stated that his energy is "very low." He stated that he doesn't get pleasure out of anything at all and he doesn't have much motivation to do anything.  With respect to functioning, the patient is not currently employed and has not worked in several years. He lives independently. He is still driving and reported that he is doing adequately although he doesn't go  many places. He mainly orders food in and stated that he "absolutely hates cooking." He hasn't been LARPing since [place], although he feels like he would have a hard time doing that now related to his cognitive difficulties. He did try to return to [facility] after the ECT and had a very hard time doing so as per him. He stated that if you asked the people who play video games with him, they would think that he is doing fine, but he doesn't feel like he is  doing well. 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 He is paying all his own bills and has his taxes prepared by a service.   Past Medical History and Review of Relevant Studies   Patient Active Problem List   Diagnosis Date Noted  . Hypogonadism male 02/22/2018  . HTN (hypertension) 02/22/2018  . Chronic venous insufficiency 02/22/2018  . Moderate tetrahydrocannabinol (THC) dependence ([place]) 01/25/2018  . Sleep disorder 01/25/2018  . Tinea pedis 01/25/2018  . Edema 01/22/2018  . Bipolar depression ([place]) 01/22/2018  . Rash 01/22/2018  . Delirium of mixed origin 07/22/2017  . Acute cystitis with positive culture 01/05/2017  . 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 OSA (obstructive sleep apnea) 03/24/2014  . Morbid obesity ([place]) 12/14/2013  . Allergic rhinitis 12/14/2013   Review of Neuroimaging and Relevant Medical History: :  The patient has an MRI of the brain from 2019, which was normal.   Mr. Winchell consulted with Dr. Leta Baptist with [facility] in 2019, who noted "pulsing disorientation" sensations from his shoulders, neck, face, and head.   Mr. Keirsey consulted with Dr. Rexene Alberts in 2019 also, who did a home sleep study, and did not feel he had sleep apnea.   Previous MMSE of 27/30 noted with Pychiatry at the end of ECT treatment, with points off for sentence reading and delayed recall.    Current Outpatient Medications  Medication Sig Dispense Refill  . Acetylcysteine (NAC PO) Take by mouth.    . ALPRAZolam (XANAX) 0.5 MG tablet Take 0.25-0.5 mg by mouth 3 (three) times daily as needed. Anxiety     . buPROPion (WELLBUTRIN XL) 300 MG 24 hr tablet Take 300 mg by mouth every morning.    . cetirizine (ZYRTEC) 10 MG tablet Take 10 mg by mouth daily.    . Cyanocobalamin (VITAMIN B-12) 500 MCG SUBL Place 1 tablet (500 mcg total) under the  tongue 1 day or 1 dose. 100 tablet 3  . doxycycline (VIBRA-TABS) 100 MG tablet Take 100 mg by mouth 2 (two) times daily.    Marland Kitchen escitalopram (LEXAPRO) 20 MG tablet     . finasteride (PROPECIA) 1 MG tablet One quarter tablet daily 90 tablet 3  . fish oil-omega-3 fatty acids 1000 MG capsule Take 1 g by mouth daily.     . furosemide (LASIX) 20 MG tablet Take 1 tablet (20 mg total) by mouth daily. 90 tablet 3  . MINOXIDIL, TOPICAL, (ROGAINE EXTRA STRENGTH) 5 % SOLN Apply topically 2 (two) times a day.    . Multiple Minerals-Vitamins (CITRACAL PLUS PO) Take by mouth daily.    Marland Kitchen omeprazole (PRILOSEC) 20 MG capsule Take 20 mg by mouth daily.    Marland Kitchen oxymetazoline (AFRIN) 0.05 % nasal spray Place 1 spray into both nostrils 3 (three) times daily.    . Tasimelteon (HETLIOZ) 20 MG CAPS  Take by mouth at bedtime.     . Testosterone 20.25 MG/ACT (1.62%) GEL PLACE 3 PUMPS ONTO THE SKIN EVERY MORNING. 150 g 0  . traZODone (DESYREL) 50 MG tablet Take 150 mg by mouth at bedtime.  1  . Triamcinolone Acetonide (NASACORT AQ NA) Place into the nose.     No current facility-administered medications for this visit.   Family History  Problem Relation Age of Onset  . Arthritis Maternal Grandmother   . Stroke Maternal Grandmother   . Hypertension Maternal Grandmother   . Alzheimer's disease Maternal Grandmother   . Arthritis Maternal Grandfather   . Alzheimer's disease Maternal Grandfather   . Emphysema Paternal Grandfather   . Alcohol abuse Mother   . Depression Mother   . Skin cancer Mother   . Skin cancer Father    There is a family history of dementia, the patient's mother, maternal grandmother, and grandfather have dementia. He stated that his mother developed what sounds like alcoholic dementia vs. Wernicke korsakoff (had hepatorenal syndrome and a number of other issues throughout her drinking history). His grandmother developed the condition in her 31s. There is a family history of psychiatric illness, his  mother had fairly significant alcoholism it sounds like.  Psychosocial History  Developmental, Educational and Employment History: The patient grew up in [place], he stated that he was bullied in school, starting around 3rd grade. He also had a hard time with bullying in junior high school. The patient described himself as a fairly good student who earned good grades until his senior year of high school, at which point he decided that school was "pointless and meaningless." Nevertheless, he did graduate and went on to attend college. He did two semesters at [employer]. He then went to a community college to improve his grades and ended up going to [employer] and got a degree in Business Management. 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 He has not held a traditional job in some time, perhaps 15 years, and stated that it is virtually impossible for him to have a job with his sleep disorder. He supports himself with funds that he inherited from his mother and investments.   Psychiatric History: Mr. Rosamond doesn't remember much of his psychiatric history. It sounds like he first sought treatment for depression and other issues in high school, followed for a while with medication and counseling, and then left treatment. He then went back to treatment in his late 65s and has been more or less continually involved since then. He has never been a psychiatric inpatient. He denied any suicide attempts, although he has struggled with intermittent suicidal ideation for many years. He denied any suicidal intent or plan today. It sounds like some of this is tied to his perception of his cognitive ability, because he has a hard time picturing living the rest of his life the way that he feels now.   Substance Use History: The patient doesn't drink alcohol currently. He used  to smoke cannabis but has not been smoking lately, over the past two weeks. It sounds like he is trying to improve his overall health and has a hard time losing weight when he is smoking.    Relationship History and Living Cimcumstances: The patient has been involved in romantic relationships previously, his last relationship was in 2005 - 2007.  He identifies as gay and has primarily been involved with male partners.   Mental Status and Behavioral Observations  Sensorium/Arousal: The patient's level of arousal was awake and alert. Hearing and vision were adequate for testing purposes. Orientation: The patient was fully oriented to person, place, time, and was well grounded in situation.  Appearance: Dressed in appropriate, casual clothing with reasonable grooming and hygiene.  Behavior: The patient was pleasant and appropriate Speech/language: Normal in rate, rhythm, volume, and prosody.  Gait/Posture: Gait was not well observed Movement: No overt signs/symptoms of movement disorder such as hypokinesia, bradykinesia, or adventitious movements.  Social Comportment: The patient was pleasant and appropriate.  Mood: Sad Affect: Mainly neutral Thought process/content: The patient's thought process was logical, linear, and goal-oriented. He thought quickly and provided a detailed and reliable history, as cross referenced with information in his chart. Thought content was appropriate to the topics discussed.  Safety: Mr. Cassandria SanteeRilee reported ongoing, chronic passive suicidal ideation for several years. He denied having any intent or plan. He responded affirmatively to a question indicating that he has thoughts of killing himself but would not carry them out, which was queried and again he clarified he has no intent of acting on his thoughts and they are passive.  Insight: Christin BachFair  Montreal Cognitive Assessment  12/13/2019  Visuospatial/ Executive (0/5) 4  Naming (0/3) 3  Attention: Read list of digits (0/2) 2   Attention: Read list of letters (0/1) 1  Attention: Serial 7 subtraction starting at 100 (0/3) 3  Language: Repeat phrase (0/2) 2  Language : Fluency (0/1) 1  Abstraction (0/2) 2  Delayed Recall (0/5) 5  Orientation (0/6) 5  Total 28  Adjusted Score (based on education) 28   Test Procedures  Wide Range Achievement Test - 4 Word Reading Reynolds' Intellectual Screening Test Wechsler Adult Intelligence Scale - IV  Digit Span  Arithmetic  Symbol Search  Coding Neuropsychological Assessment Battery  Memory Module  Visual Journalist, newspaperDiscrimination  Design Construction ACS Word Choice The Dot Counting Test Controlled Oral Word Association (F-A-S) Physicist, medicalemantic Fluency (Animals) Trail Making Test A & B [employer] CorporationWisconsin Card Sorting Test - [id number] Delis-Kaplan Executive Functions System  Color-word Interference Beck Depression Inventory - II [facility] View HospitalBeck Anxiety Inventory  Plan  Melany GuernseyMichael O Gripp was seen for a psychiatric diagnostic evaluation and neuropsychological testing. He is a pleasant 39 year old, right-hand dominant man with a history of treatment refractory depression. He tried ECT in 2019 and feels that he has not been the same since then, with respect to cognitive functioning. 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 He is doing quite well on mental status screening but also presents as a bright individual and as such, neuropsychological testing will be helpful to better interrogate his cognitive status relative to his age and education matched cohort. Full and complete note with impressions, recommendations, and interpretation of test data to follow.   Bettye BoeckPeter V. Roseanne RenoStewart, PsyD, ABN Clinical Neuropsychologist  Informed Consent and Coding/Compliance  Risks and benefits of the evaluation were discussed with the patient prior to all testing procedures. I conducted a clinical interview and neuropsychological testing (at least two  tests) with Melany GuernseyMichael O Currie and Clare CharonKimberly Shuttleworth, B.S. (Technician) assisted me in administering additional test procedures. The patient was able to tolerate the testing procedures and the patient (and/or family if applicable) is likely to benefit from further follow up to receive the diagnosis and treatment recommendations,  which will be rendered at the next encounter. Billing below reflects technician time, my direct face-to-face time with the patient, time spent in test administration, and time spent in professional activities including but not limited to: neuropsychological test interpretation, integration of neuropsychological test data with clinical history, report preparation, treatment planning, care coordination, and review of diagnostically pertinent medical history or studies.   Services associated with this encounter: Clinical Interview [phone]) plus 60 minutes ([id number]; Neuropsychological Evaluation by Professional)  160 minutes ([id number]; Neuropsychological Evaluation by Professional, Adl.) 30 minutes (64680; Test Administration by Professional) 30 minutes (32122; Neuropsychological Testing by Technician) 105 minutes ([id number]; Neuropsychological Testing by Technician, Adl.)

## 2019-12-20 ENCOUNTER — Encounter: Payer: Self-pay | Admitting: Counselor

## 2019-12-20 ENCOUNTER — Ambulatory Visit (INDEPENDENT_AMBULATORY_CARE_PROVIDER_SITE_OTHER): Payer: BC Managed Care – PPO | Admitting: Counselor

## 2019-12-20 ENCOUNTER — Other Ambulatory Visit: Payer: Self-pay

## 2019-12-20 DIAGNOSIS — G4720 Circadian rhythm sleep disorder, unspecified type: Secondary | ICD-10-CM

## 2019-12-20 DIAGNOSIS — R4189 Other symptoms and signs involving cognitive functions and awareness: Secondary | ICD-10-CM

## 2019-12-20 DIAGNOSIS — F329 Major depressive disorder, single episode, unspecified: Secondary | ICD-10-CM

## 2019-12-20 DIAGNOSIS — F32A Depression, unspecified: Secondary | ICD-10-CM

## 2019-12-20 NOTE — Progress Notes (Signed)
NEUROPSYCHOLOGICAL EVALUATION [facility]  Patient Name: Herbert Bryant MRN: [id number] Date of Birth: 06/26/1981 Age: 39 y.o. Education: 16 years  Clinical Impressions  Herbert Bryant is a 39 y.o., right-hand dominant, single man with a history of significant depression that has been treatment refractory to psychotherapy and a number of different medications. He tried ECT with Dr. Toni Amend and completed 7 sessions of treatment (07/20/2017-08/05/2017) before discontinuing because it was not efficacious. He also reported having an adverse reaction in the form of an agitated confusional event. 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 He is still functionally independent but does not feel like he is functioning at the level he did previously.   Herbert Bryant generally demonstrated extremely strong neuropsychological performance relative to his age and education matched cohort in nearly all areas assessed. He achieved a superior score on a measure of overall intellectual ability, and performance commensurate with that expectation in nearly all areas assessed. Working memory was high average and speed of processing was superior at an index level. He did have some marginal findings with respect to verbal memory encoding on short story and word list learning that may be picking up on some of the changes he has noticed but his overall memory performance was average.   Relative to his age cohort, Herbert Bryant is thus performing very well in essentially all areas. He may have some mild difficulties with memory encoding related to executive control problems but he performed at a reasonable, average level overall and his memory is not clearly impaired. Normal neuropsychological performance does not rule out the possibility of subtle decline. My sense is that his day-to-day issues are multifactorial and while  some contribution from his history of ECT cannot be excluded, I also think his affective issues, history of ADHD, sleep disorder, and medication side effects should be considered.   Diagnostic Impressions: Other symptoms and signs involving cognitive functions and awareness Depressive disorder Attention-Deficit/Hyperactivity Disorder (by history)  Recommendations to be discussed with patient  Your performance and presentation on neuropsychological assessment were consistent with very good performance in nearly all areas assessed. This means that relative to other individuals of your age and education level with no known neurological impairment, you were able to perform well. Specifically, you demonstrated superior intellectual abilities, very superior processing speed, high average working memory, and average memory performance. You did have two low scores on more challenging measures of memory encoding, which may suggest some subtle cognitive inefficiencies and/or executive control problems. Nevertheless, your memory fell at a reasonable average level overall and is not clearly "impaired."   As we discussed at your initial appointment, normal neuropsychological performance does not rule out the possibility of some cognitive problems. Neuropsychological testing is admittedly imperfect and subtle difficulties may not be detectable by neuropsychological means. The real world also comes with many complicating factors that neuropsychological testing does not including distractions, unclear expectations, having to make decisions with incomplete information, and unrealistic expectations that may be placed on individuals by themselves or others.   As for ECT, I can certainly not rule out that this has had some effect on your cognitive functioning. I can tell you that there is a large literature on ECT suggesting that the most important predictor of ongoing cognitive symptoms following ECT is time. Most  individuals experience measurable cognitive difficulties for some period of time following ECT, although the vast majority  of studies suggest that these problems remit and sometimes improve to better than baseline given enough time. A minority of individuals do continue to report retrograde memory problems and other cognitive problems, and the mechanism responsible for these symptoms is not clear.   In terms of getting you back on track, I think there are several things that may be helpful going forward. Cognitive rehabilitation involves working one-on-one with a rehabilitation specialist (most often a Psychologist, clinical) who can use the information from this assessment and from their own assessment to tailor a set of activities to your unique needs. These activities are designed to strengthen your cognitive abilities, similar to strength training for the brain.   We discussed cognitive rehabilitation and you wanted something you could do on your own schedule. I can recommend the website [email or web address], which is well thought out and has a good level of scientific support in terms of results. They have some free content and you can also sign up for a membership if desired.   There are few things that are as disruptive to the brain as insufficient sleep and sleep disorders. Non-24 circadian rhythm disorder is most frequently associated with blindness but as you know, some seeing individuals also develop this problem. You are already on Tasimelteon and reported that it helps to some extent. You might also consider consulting with a sleep specialist who is familiar with this disorder and can treat this issue.   You should know that some of the medications you are on, including Alprazolam, are known to cause cognitive side effects including memory problems. There is a cost benefit analysis that must be carefully considered by you and your prescribing provider because anxiety can also cause cognitive  problems, but you may wish to consider discussing this medication if peak cognitive performance is a desire for you.   We discussed ADHD as a potentially contributory to the issues you are noticing.  Perhaps most importantly, be patient, go easy on yourself, and give yourself credit for the things you are getting right. You are clearly an extremely bright, capable individual with a lot to offer the world. Know that normal people lose their train of thought, have working memory difficulties, and sometimes have difficulties remembering the specific things they did days or weeks ago in the past. Having an injury, illness, or procedure that affects cognitive functioning can put you at risk for overattributing normal cognitive problems to the injury, illness, or treatment.   Test Findings  Test scores are summarized in additional documentation associated with this encounter. Test scores are relative to age, gender, and educational history as available and appropriate. There were no concerns about performance validity as all findings fell within normal expectations.   General Intellectual Functioning/Achievement:  Performance on single word reading was high average. Mr. Bail achieved an even better, superior range score on the RIST index with comparable high average performance on the verbally and visually oriented subtests.   Attention and Processing Efficiency: Performance on measures of attention and working memory was very strong with a high average score on the Working Memory Index from the [place]. Average range scores were demonstrated on measures of digit repetition forward, backward, and digit resequencing in ascending order. Mental solving of arithmetical word problems without paper and pencil was very strong and fell at a superior level.   With respect to processing efficiency, Mr. Releford achieved a very superior score. Very superior performance was demonstrated on a symbol matching to sample  task and  his score was superior on timed number-symbol coding. He performed in the high average range for color naming under time pressure and in the average range for word reading.   Language: Language findings reflected normal performance with average visual object confrontation naming and phonemic verbal fluency.   Visuospatial Function: Performance was normal on measures of visuospatial and constructional abilities with average performance on constructional and perceptual tasks.   Learning and Memory: Learning and memory measures generated an average score overall despite a couple of low subtest scores. Specifically, performance was marginally below expectations on measures of word list and short story learning, which could reflect some subtle inefficiencies due to executive control factors.   In the verbal realm, Mr. Hackman demonstrated low average immediate recall for information including a 12-item word list and unusually low immediate recall for a short story, which is marginally below expectations for him. Nevertheless, he retained that information well and had average scores at short and long delayed recall for the word list and at long delayed recall for the short story. Yes/no recognition for the words from the list versus false choices was very good and fell at a high average level. Memory for brief daily living memory was within normal limits with high average immediate and low average delayed recall. Delayed recognition was average.   In the visual realm, Mr. Finlay achieved a high average score on immediate recognition of a series of designs that are difficult to verbally encode and comparable high average delayed recognition. Delayed forced choice (I.e., yes/no) recognition was high average.   Executive Functions: Performance was normal on all executive measures. Specifically, Mr. Bozzi demonstrated adequate average scores for categories completed, perseverative errors, and total  errors on a rule-based categorization procedure involving card sorting. His alternating sequencing of numbers and letters of the alphabet was high average. Generation of words in response to the letters F-A-S was average. Performance was average when suppressing a habitual response in favor of a novel response and when switching between response sets on the Stroop test. Clock drawing was within normal limits with good formation of the face, number, and hand placement.   Rating Scale(s): Mr. Gironda reported severe levels of depressive symptoms, many of the focused on the self, including feelings of guilt, disliking himself, and thinking that things won't work out for him. He reported a moderate level of anxiety symptoms.   Bettye Boeck Roseanne Reno PsyD, ABN Clinical Neuropsychologist

## 2019-12-20 NOTE — Progress Notes (Signed)
NEUROPSYCHOLOGICAL TEST SCORES [facility]  Patient Name: Herbert Bryant MRN: [id number] Date of Birth: 10-11-1980 Age: 39 y.o. Education: 16 years  Measurement properties of test scores: IQ, Index, and Standard Scores (SS): Mean = 100; Standard Deviation = 15 Scaled Scores (Ss): Mean = 10; Standard Deviation = 3 Z scores (Z): Mean = 0; Standard Deviation = 1 T scores (T); Mean = 50; Standard Deviation = 10  TEST SCORES:    Note: This summary of test scores accompanies the interpretive report and should not be interpreted by unqualified individuals or in isolation without reference to the report. Test scores are relative to age, gender, and educational history as available and appropriate.   Performance Validity        ACS: Raw  Descriptor      Word Choice 48 Within Expectation       Raw  Descriptor  The Dot Counting Test: 7 Within Expectation      Embedded Measures: Raw  Descriptor      RBANS Effort Index 12 Below Expectation      WAIS-IV Reliable Digit Span 10 Within Expectation      WAIS-IV Reliable Digit Span Revised 16 Within Expectation      Expected Functioning        Wide Range Achievement Test: Standard/Scaled Score Percentile      Word Reading 114 82      Reynolds Intellectual Screening Test Standard/T-score Percentile      Guess What 61 87      Odd Item Out 62 89  RIST Index 122 93      Attention/Processing Speed        Wechsler Adult Intelligence Scale - IV: Standard/Scaled Score Percentile  Working Memory Index 117 87      Digit Span 11 63          Digit Span Forward 10 50          Digit Span Backward 11 63          Digit Span Sequencing 11 63      Arithmetic 15 95  Processing Speed Index 135 99      Symbol Search 19 >99      Coding 14 91      Language        Neuropsychological Assessment Battery (Language Module, Form 1): T-score Percentile      Naming   (31) 53 62      Verbal Fluency:         Controlled Oral Word Association  (F-A-S) 51 54      Memory:        Neuropsychological Assessment Battery (Memory Module, Form 1): T-score/Standard Score Percentile  Memory Index (MEM): 94 34      List Learning           List A Immediate Recall   (6 , 8 , 8) 38 12         List B Immediate Recall   (3) 31 3         List A Short Delayed Recall   (9) 49 46         List A Long Delayed Recall   (8) 45 31         List A Long Delayed Yes/No Recognition Hits   (12) --- 79         List A Long Delayed Yes/No Recognition False Alarms   (0) --- 66  List A Recognition Discriminability Index --- 79      Shape Learning           Immediate Recognition   (6 , 8 , 9) 59 82         Delayed Recognition   (8) 58 79         Delayed Forced-Choice Recognition Hits   (9) --- 79         Delayed Forced-Choice Recognition False Alarms   (0) --- 62         Delayed Forced-Choice Recognition Discriminability --- 79     Story Learning           Immediate Recall   (14, 35) 34 5         Delayed Recall   (33) 45 31      Daily Living Memory            Immediate Recall   (27, 22) 60 84          Delayed Recall   (8, 7) 43 25          Recognition Hits   (9) --- 31      Visuospatial/Constructional Functioning        Neuropsychological Assessment Battery (Visuospatial Module) T-score Percentile      Visual Discrimination 56 73      Design Construction 51 54      Executive Functioning        [place] Card Sorting Test - 64: T-score Percentile      Categories --- >16 %ile      Total Errors 53 62      Perseverative Errors 46 35      Nonperseverative Errors 49 47      Conceptual Level Responses 49 47      D-KEFS Color-Word Interference Test: Raw (Scaled Score) Percentile     Color Naming 22 secs. (13) 84     Word Reading 19 secs. (12) 75     Inhibition 45 secs. (12) 75        Total Errors 0 errors (12) 75     Inhibition/Switching 48 secs. (12) 75        Total Errors 0 errors (12) 75      Trail Making Test: T-Score Percentile      Part  A 70 98      Part B 58 79      Clock Drawing Raw Score Descriptor      Command 10 WNL      Rating Scales         Raw Score Descriptor  Beck Depression Inventory - II 30 Severe Depression  Beck Anxiety Inventory 24 Moderate Anxiety   Zoey Gilkeson V. Nicole Kindred PsyD, [place] Clinical Neuropsychologist

## 2019-12-20 NOTE — Patient Instructions (Signed)
Your performance and presentation on neuropsychological assessment were consistent with very good performance in nearly all areas assessed. This means that relative to other individuals of your age and education level with no known neurological impairment, you were able to perform well. Specifically, you demonstrated superior intellectual abilities, very superior processing speed, high average working memory, and average memory performance. You did have two low scores on more challenging measures of memory encoding, which may suggest some subtle cognitive inefficiencies and/or executive control problems. Nevertheless, your memory fell at a reasonable average level overall and is not clearly "impaired."   As we discussed at your initial appointment, normal neuropsychological performance does not rule out the possibility of some cognitive problems. Neuropsychological testing is admittedly imperfect and subtle difficulties may not be detectable by neuropsychological means. The real world also comes with many complicating factors that neuropsychological testing does not including distractions, unclear expectations, having to make decisions with incomplete information, and unrealistic expectations that may be placed on individuals by themselves or others.   As for ECT, I can certainly not rule out that this has had some effect on your cognitive functioning. I can tell you that there is a large literature on ECT suggesting that the most important predictor of ongoing cognitive symptoms following ECT is time. Most individuals experience measurable cognitive difficulties for some period of time following ECT, although the vast majority of studies suggest that these problems remit and sometimes improve to better than baseline given enough time. A minority of individuals do continue to report retrograde memory problems and other cognitive problems, and the mechanism responsible for these symptoms is not clear.   In  terms of getting you back on track, I think there are several things that may be helpful going forward. Cognitive rehabilitation involves working one-on-one with a rehabilitation specialist (most often a Warehouse manager) who can use the information from this assessment and from their own assessment to tailor a set of activities to your unique needs. These activities are designed to strengthen your cognitive abilities, similar to strength training for the brain.   We discussed cognitive rehabilitation and you wanted something you could do on your own schedule. I can recommend the website http://www.perez.com/, which is well thought out and has a good level of scientific support in terms of results. They have some free content and you can also sign up for a membership if desired.   There are few things that are as disruptive to the brain as insufficient sleep and sleep disorders. Non-24 circadian rhythm disorder is most frequently associated with blindness but as you know, some seeing individuals also develop this problem. You are already on Tasimelteon and reported that it helps to some extent. You might also consider consulting with a sleep specialist who is familiar with this disorder and can treat this issue.   You should know that some of the medications you are on, including Alprazolam, are known to cause cognitive side effects including memory problems. There is a cost benefit analysis that must be carefully considered by you and your prescribing provider because anxiety can also cause cognitive problems, but you may wish to consider discussing this medication if peak cognitive performance is a desire for you.   We discussed ADHD as a potentially contributory to the issues you are noticing.  Perhaps most importantly, be patient, go easy on yourself, and give yourself credit for the things you are getting right. You are clearly an extremely bright, capable individual with a lot  to offer the  world. Know that normal people lose their train of thought, have working memory difficulties, and sometimes have difficulties remembering the specific things they did days or weeks ago in the past. Having an injury, illness, or procedure that affects cognitive functioning can put you at risk for overattributing normal cognitive problems to the injury, illness, or treatment.

## 2019-12-20 NOTE — Progress Notes (Addendum)
Telemedicine statement:  I discussed the limitations of neuropsychological care via telemedicine and the availability of in person appointments. The patient expressed understanding and agreed to proceed. The patient was verified with two identifiers.  The visit modality was: telephonic The patient location was: home The provider location was: office  The following individuals participated: Herbert Bryant  I met with Herbert Bryant to review the findings resulting from his neuropsychological evaluation. Since the last appointment, he has been about the same. He is not sleeping well. Time was spent reviewing the impressions and recommendations that are detailed in the evaluation report. We discussed impression of essentially normal overall cognitive performance, albeit with some low scores that may be picking up on subtle executive control issues. We had a long discussion about potential contributing factors, including expectancy as etiology. Herbert Bryant is also disturbed by a subjective sense of dissociation and feeling as though he has lost the part of himself that he liked most (i.e., his cognitive abilities). I recommended that he establish in psychotherapy and he accepted a referral, to talk about those feelings. He might also benefit from CBTi. I took time to explain the findings and answer all the patient's questions. I encouraged Herbert Bryant to contact me should he have any further questions or if further follow up is desired.   Current Medications and Medical History   Current Outpatient Medications  Medication Sig Dispense Refill  . Acetylcysteine (NAC PO) Take by mouth.    . ALPRAZolam (XANAX) 0.5 MG tablet Take 0.25-0.5 mg by mouth 3 (three) times daily as needed. Anxiety     . buPROPion (WELLBUTRIN XL) 300 MG 24 hr tablet Take 300 mg by mouth every morning.    . cetirizine (ZYRTEC) 10 MG tablet Take 10 mg by mouth daily.    .  Cyanocobalamin (VITAMIN B-12) 500 MCG SUBL Place 1 tablet (500 mcg total) under the tongue 1 day or 1 dose. 100 tablet 3  . doxycycline (VIBRA-TABS) 100 MG tablet Take 100 mg by mouth 2 (two) times daily.    Marland Kitchen escitalopram (LEXAPRO) 20 MG tablet     . finasteride (PROPECIA) 1 MG tablet One quarter tablet daily 90 tablet 3  . fish oil-omega-3 fatty acids 1000 MG capsule Take 1 g by mouth daily.     . furosemide (LASIX) 20 MG tablet Take 1 tablet (20 mg total) by mouth daily. 90 tablet 3  . MINOXIDIL, TOPICAL, (ROGAINE EXTRA STRENGTH) 5 % SOLN Apply topically 2 (two) times a day.    . Multiple Minerals-Vitamins (CITRACAL PLUS PO) Take by mouth daily.    Marland Kitchen omeprazole (PRILOSEC) 20 MG capsule Take 20 mg by mouth daily.    Marland Kitchen oxymetazoline (AFRIN) 0.05 % nasal spray Place 1 spray into both nostrils 3 (three) times daily.    . Tasimelteon (HETLIOZ) 20 MG CAPS Take by mouth at bedtime.     . Testosterone 20.25 MG/ACT (1.62%) GEL PLACE 3 PUMPS ONTO THE SKIN EVERY MORNING. 150 g 0  . traZODone (DESYREL) 50 MG tablet Take 150 mg by mouth at bedtime.  1  . Triamcinolone Acetonide (NASACORT AQ NA) Place into the nose.     No current facility-administered medications for this visit.    Patient Active Problem List   Diagnosis Date Noted  . Hypogonadism male 02/22/2018  . HTN (hypertension) 02/22/2018  . Chronic venous insufficiency 02/22/2018  . Moderate tetrahydrocannabinol (THC) dependence ([place]) 01/25/2018  . Sleep disorder  01/25/2018  . Tinea pedis 01/25/2018  . Edema 01/22/2018  . Bipolar depression ([place]) 01/22/2018  . Rash 01/22/2018  . Delirium of mixed origin 07/22/2017  . Acute cystitis with positive culture 01/05/2017  . 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 OSA (obstructive sleep apnea) 03/24/2014  . Morbid obesity ([place]) 12/14/2013  . Allergic rhinitis 12/14/2013    Mental Status and Behavioral Observations  Herbert Bryant was available at the prespecified time for this telephonic  appointment and was alert and generally oriented (orientation not formally assessed). Speech was normal in rate, rhythm, volume, and prosody. Self-reported mood was depressed and affect as assessed by vocal quality was mainly neutral. Thought process was logical, linear, and goal oriented and thought content was appropriate to the topics discussed. Herbert Bryant continues to have ongoing passive suicidal ideation, somewhat better since the previous appointment, but as before has no suicidal intent, or plan, and feels in control. He is future oriented and is interested in cognitive remediation to treat the changes he has noticed  Plan  Feedback provided regarding the patient's neuropsychological evaluation. I provided my strong recommendation for psychotherapy and Herbert Bryant accepted a referral. He has a longstanding history of depressive issues, negative feelings about the self, and feels as though he has lost the part of himself that he liked the most following ECT. He would likely benefit from exploring some of these themes. Herbert Bryant was encouraged to contact me if any questions arise or if further follow up is desired.   Herbert Simas Nicole Kindred, PsyD, ABN Clinical Neuropsychologist  Service(s) Provided at This Encounter: 12 minutes [phone]; Psychotherapy with patient/family)

## 2019-12-20 NOTE — Progress Notes (Signed)
Note faxed to Dr. Evelene Croon 12/20/2019 via Epic.

## 2020-01-02 DIAGNOSIS — H04123 Dry eye syndrome of bilateral lacrimal glands: Secondary | ICD-10-CM | POA: Diagnosis not present

## 2020-01-06 ENCOUNTER — Other Ambulatory Visit: Payer: Self-pay

## 2020-01-06 ENCOUNTER — Ambulatory Visit (INDEPENDENT_AMBULATORY_CARE_PROVIDER_SITE_OTHER): Payer: BC Managed Care – PPO | Admitting: Podiatry

## 2020-01-06 ENCOUNTER — Encounter: Payer: Self-pay | Admitting: Podiatry

## 2020-01-06 DIAGNOSIS — L600 Ingrowing nail: Secondary | ICD-10-CM

## 2020-01-06 DIAGNOSIS — I872 Venous insufficiency (chronic) (peripheral): Secondary | ICD-10-CM

## 2020-01-06 NOTE — Patient Instructions (Signed)
Use urea cream to soften the rough skin on the toes. This may not change the discoloration. Look for 20-40% cream, such as [place] Smooth or another brand. You can buy this here or on [employer]

## 2020-01-06 NOTE — Progress Notes (Signed)
Subjective:  Patient ID: Herbert Bryant, male    DOB: Sep 10, 1980,  MRN: [id number]  Chief Complaint  Patient presents with  . Ingrown Toenail    left great toenail feeels ingrown    39 y.o. male presents with the above complaint.  Left hallux nail medial border is painful for him and is ingrown.  He would like the offending border removed.  Denies redness or drainage.  Requests to do this under local anesthesia.  He previously saw a podiatrist for heel pain in the left foot, as well as discoloration on his toes.  He was sent to a dermatologist who told him that he had stasis dermatitis, and prescribed him a topical steroid.  He wears compression garments.   Review of Systems: Negative except as noted in the HPI. Denies N/V/F/Ch.  Past Medical History:  Diagnosis Date  . Acne   . ADD (attention deficit disorder)   . ADHD (attention deficit hyperactivity disorder)   . Allergy   . Anxiety   . Bipolar 1 disorder ([place])   . Depression   . GERD (gastroesophageal reflux disease)   . Hypertension   . IBS (irritable bowel syndrome)     Current Outpatient Medications:  .  Acetylcysteine (NAC PO), Take by mouth., Disp: , Rfl:  .  ALPRAZolam (XANAX) 0.5 MG tablet, Take 0.25-0.5 mg by mouth 3 (three) times daily as needed. Anxiety , Disp: , Rfl:  .  buPROPion (WELLBUTRIN XL) 150 MG 24 hr tablet, Take 150 mg by mouth every morning., Disp: , Rfl:  .  buPROPion (WELLBUTRIN XL) 300 MG 24 hr tablet, Take 300 mg by mouth every morning., Disp: , Rfl:  .  cetirizine (ZYRTEC) 10 MG tablet, Take 10 mg by mouth daily., Disp: , Rfl:  .  Cyanocobalamin (VITAMIN B-12) 500 MCG SUBL, Place 1 tablet (500 mcg total) under the tongue 1 day or 1 dose., Disp: 100 tablet, Rfl: 3 .  finasteride (PROPECIA) 1 MG tablet, One quarter tablet daily, Disp: 90 tablet, Rfl: 3 .  fish oil-omega-3 fatty acids 1000 MG capsule, Take 1 g by mouth daily. , Disp: , Rfl:  .  furosemide (LASIX) 20 MG tablet, Take 1 tablet (20 mg  total) by mouth daily., Disp: 90 tablet, Rfl: 3 .  GuanFACINE HCl 3 MG TB24, Take 1 tablet by mouth daily., Disp: , Rfl:  .  MINOXIDIL, TOPICAL, (ROGAINE EXTRA STRENGTH) 5 % SOLN, Apply topically 2 (two) times a day., Disp: , Rfl:  .  Multiple Minerals-Vitamins (CITRACAL PLUS PO), Take by mouth daily., Disp: , Rfl:  .  omeprazole (PRILOSEC) 20 MG capsule, Take 20 mg by mouth daily., Disp: , Rfl:  .  oxymetazoline (AFRIN) 0.05 % nasal spray, Place 1 spray into both nostrils 3 (three) times daily., Disp: , Rfl:  .  Tasimelteon (HETLIOZ) 20 MG CAPS, Take by mouth at bedtime. , Disp: , Rfl:  .  Testosterone 20.25 MG/ACT (1.62%) GEL, PLACE 3 PUMPS ONTO THE SKIN EVERY MORNING., Disp: 150 g, Rfl: 0 .  traZODone (DESYREL) 50 MG tablet, Take 150 mg by mouth at bedtime., Disp: , Rfl: 1 .  tretinoin (RETIN-A) 0.025 % cream, SMARTSIG:Topical Every Evening, Disp: , Rfl:  .  Triamcinolone Acetonide (NASACORT AQ NA), Place into the nose., Disp: , Rfl:  .  triamcinolone ointment (KENALOG) 0.1 %, Apply 1 application topically 2 (two) times daily., Disp: , Rfl:   Social History   Tobacco Use  Smoking Status Former Smoker  . Packs/day:  0.50  . Types: Cigarettes  . Quit date: 06/25/2012  . Years since quitting: 7.5  Smokeless Tobacco Never Used    Allergies  Allergen Reactions  . Losartan     Diarrhea, CP, fatigue   Objective:  There were no vitals filed for this visit. There is no height or weight on file to calculate BMI. Constitutional Well developed. Well nourished.  Vascular Dorsalis pedis pulses palpable bilaterally. Posterior tibial pulses palpable bilaterally. Capillary refill normal to all digits.  No cyanosis or clubbing noted. Pedal hair growth normal. Hemosiderosis noticed on the medial arch, as well as the dorsal toes.  With evidence of previous stasis dermatitis with lichenification of the skin.  Neurologic Normal speech. Oriented to person, place, and time. Epicritic sensation to  light touch grossly present bilaterally.  Dermatologic Painful ingrowing nail at medial nail borders of the hallux nail left without paronychia. No other open wounds. No skin lesions.  Orthopedic: Normal joint ROM without pain or crepitus bilaterally. No visible deformities. No bony tenderness.   Radiographs: None Assessment:   1. Ingrowing nail   2. Chronic venous insufficiency   3. Hemosiderosis   4. Venous stasis dermatitis of both lower extremities    Plan:  Patient was evaluated and treated and all questions answered.  1. Ingrown Nail, left -Patient elects to proceed with minor surgery to remove ingrown toenail today. Consent reviewed and signed by patient. -Ingrown nail excised. See procedure note. -Educated on post-procedure care including soaking. Written instructions provided and reviewed. -Patient to follow up in 2 weeks for nail check.  Procedure: Excision of Ingrown Toenail Location: Left 1st toe medial nail borders. Anesthesia: Lidocaine 1% plain; 1.5 mL and Marcaine 0.5% plain; 1.5 mL, digital block. Skin Prep: Betadine. Dressing: Silvadene; telfa; dry, sterile, compression dressing. Technique: Following skin prep, the toe was exsanguinated and a tourniquet was secured at the base of the toe. The affected nail border was freed, split with a nail splitter, and excised. The tourniquet was then removed and sterile dressing applied. Disposition: Patient tolerated procedure well. Patient to return in 2 weeks for follow-up.   2. 3. 4.  Chronic venous insufficiency with hemosiderosis and venous stasis dermatitis -Recommended he use urea cream to address the lichenification of the skin. -Topical steroid creams were previously not helpful, and he does not currently have any pain or itching -He would like to see a vein specialist to see if they can help with his edema and hemosiderosis.  Referral to be placed to [facility]    Sharl Ma,  DPM 01/06/2020   Return if symptoms worsen or fail to improve.

## 2020-01-09 ENCOUNTER — Telehealth: Payer: Self-pay | Admitting: *Deleted

## 2020-01-09 DIAGNOSIS — I872 Venous insufficiency (chronic) (peripheral): Secondary | ICD-10-CM

## 2020-01-09 NOTE — Telephone Encounter (Signed)
-----   Message from Edwin Cap, DPM sent at 01/06/2020  6:30 PM EDT ----- Regarding: [facility] referral Hi Herbert Bryant, [place] has a history of chronic venous insufficiency, edema and hemosiderosis.  He would like to see a vein specialist I told him we would refer him to [facility].  Thanks! Sharl Ma, DPM 01/06/2020

## 2020-01-09 NOTE — Telephone Encounter (Signed)
Faxed referral to [facility].

## 2020-01-11 ENCOUNTER — Other Ambulatory Visit: Payer: Self-pay

## 2020-01-11 ENCOUNTER — Ambulatory Visit (INDEPENDENT_AMBULATORY_CARE_PROVIDER_SITE_OTHER): Payer: BC Managed Care – PPO | Admitting: Internal Medicine

## 2020-01-11 ENCOUNTER — Encounter: Payer: Self-pay | Admitting: Internal Medicine

## 2020-01-11 DIAGNOSIS — I10 Essential (primary) hypertension: Secondary | ICD-10-CM

## 2020-01-11 DIAGNOSIS — F319 Bipolar disorder, unspecified: Secondary | ICD-10-CM | POA: Diagnosis not present

## 2020-01-11 DIAGNOSIS — G4733 Obstructive sleep apnea (adult) (pediatric): Secondary | ICD-10-CM

## 2020-01-11 DIAGNOSIS — E291 Testicular hypofunction: Secondary | ICD-10-CM

## 2020-01-11 DIAGNOSIS — R5383 Other fatigue: Secondary | ICD-10-CM | POA: Insufficient documentation

## 2020-01-11 NOTE — Assessment & Plan Note (Signed)
No need for a CPAP per Neurology 2019 per Dr Frances Furbish

## 2020-01-11 NOTE — Assessment & Plan Note (Signed)
Labs

## 2020-01-11 NOTE — Progress Notes (Signed)
Subjective:  Patient ID: Herbert Bryant, male    DOB: August 27, 1980  Age: 39 y.o. MRN: [id number]  CC: No chief complaint on file.   HPI Herbert Bryant presents for fatigue x 5 months (worse), sleep disorder, depression S/p ECT 2019 x 4 sessions  Outpatient Medications Prior to Visit  Medication Sig Dispense Refill   Acetylcysteine (NAC PO) Take by mouth.     ALPRAZolam (XANAX) 0.5 MG tablet Take 0.25-0.5 mg by mouth 3 (three) times daily as needed. Anxiety      cetirizine (ZYRTEC) 10 MG tablet Take 10 mg by mouth daily.     Cyanocobalamin (VITAMIN B-12) 500 MCG SUBL Place 1 tablet (500 mcg total) under the tongue 1 day or 1 dose. 100 tablet 3   finasteride (PROPECIA) 1 MG tablet One quarter tablet daily 90 tablet 3   fish oil-omega-3 fatty acids 1000 MG capsule Take 1 g by mouth daily.      GuanFACINE HCl 3 MG TB24 Take 1 tablet by mouth daily.     Lifitegrast (XIIDRA) 5 % SOLN Apply 1 drop to eye in the morning and at bedtime.     MINOXIDIL, TOPICAL, (ROGAINE EXTRA STRENGTH) 5 % SOLN Apply topically 2 (two) times a day.     Multiple Minerals-Vitamins (CITRACAL PLUS PO) Take by mouth daily.     omeprazole (PRILOSEC) 20 MG capsule Take 20 mg by mouth daily.     oxymetazoline (AFRIN) 0.05 % nasal spray Place 1 spray into both nostrils 3 (three) times daily.     Tasimelteon (HETLIOZ) 20 MG CAPS Take by mouth at bedtime.      Testosterone 20.25 MG/ACT (1.62%) GEL PLACE 3 PUMPS ONTO THE SKIN EVERY MORNING. 150 g 0   traZODone (DESYREL) 50 MG tablet Take 150 mg by mouth at bedtime.  1   tretinoin (RETIN-A) 0.025 % cream SMARTSIG:Topical Every Evening     buPROPion (WELLBUTRIN XL) 150 MG 24 hr tablet Take 150 mg by mouth every morning. (Patient not taking: Reported on 01/11/2020)     buPROPion (WELLBUTRIN XL) 300 MG 24 hr tablet Take 300 mg by mouth every morning. (Patient not taking: Reported on 01/11/2020)     furosemide (LASIX) 20 MG tablet Take 1 tablet (20 mg total) by  mouth daily. 90 tablet 3   Triamcinolone Acetonide (NASACORT AQ NA) Place into the nose. (Patient not taking: Reported on 01/11/2020)     triamcinolone ointment (KENALOG) 0.1 % Apply 1 application topically 2 (two) times daily. (Patient not taking: Reported on 01/11/2020)     No facility-administered medications prior to visit.    ROS: Review of Systems  Constitutional: Positive for fatigue. Negative for appetite change and unexpected weight change.  HENT: Negative for congestion, nosebleeds, sneezing, sore throat and trouble swallowing.   Eyes: Negative for itching and visual disturbance.  Respiratory: Negative for cough.   Cardiovascular: Negative for chest pain, palpitations and leg swelling.  Gastrointestinal: Negative for abdominal distention, blood in stool, diarrhea and nausea.  Genitourinary: Negative for frequency and hematuria.  Musculoskeletal: Negative for back pain, gait problem, joint swelling and neck pain.  Skin: Negative for rash.  Neurological: Negative for dizziness, tremors, speech difficulty and weakness.  Psychiatric/Behavioral: Positive for sleep disturbance. Negative for agitation and dysphoric mood. The patient is nervous/anxious.     Objective:  BP 140/90 (BP Location: Left Arm, Patient Position: Sitting, Cuff Size: Large)    Pulse 92    Temp 99.5 F (37.5 C) (Oral)  Ht 5\' 10"  (1.778 m)    Wt (!) 309 lb (140.2 kg)    SpO2 96%    BMI 44.34 kg/m   BP Readings from Last 3 Encounters:  01/11/20 140/90  07/06/19 (!) 135/91  05/31/19 134/86    Wt Readings from Last 3 Encounters:  01/11/20 (!) 309 lb (140.2 kg)  07/06/19 299 lb (135.6 kg)  05/31/19 297 lb (134.7 kg)    Physical Exam Constitutional:      General: He is not in acute distress.    Appearance: He is well-developed. He is obese.     Comments: NAD  Eyes:     Conjunctiva/sclera: Conjunctivae normal.     Pupils: Pupils are equal, round, and reactive to light.  Neck:     Thyroid: No  thyromegaly.     Vascular: No JVD.  Cardiovascular:     Rate and Rhythm: Normal rate and regular rhythm.     Heart sounds: Normal heart sounds. No murmur heard.  No friction rub. No gallop.   Pulmonary:     Effort: Pulmonary effort is normal. No respiratory distress.     Breath sounds: Normal breath sounds. No wheezing or rales.  Chest:     Chest wall: No tenderness.  Abdominal:     General: Bowel sounds are normal. There is no distension.     Palpations: Abdomen is soft. There is no mass.     Tenderness: There is no abdominal tenderness. There is no guarding or rebound.  Musculoskeletal:        General: No tenderness. Normal range of motion.     Cervical back: Normal range of motion.  Lymphadenopathy:     Cervical: No cervical adenopathy.  Skin:    General: Skin is warm and dry.     Findings: No rash.  Neurological:     Mental Status: He is alert and oriented to person, place, and time.     Cranial Nerves: No cranial nerve deficit.     Motor: No abnormal muscle tone.     Coordination: Coordination normal.     Gait: Gait normal.     Deep Tendon Reflexes: Reflexes are normal and symmetric.  Psychiatric:        Behavior: Behavior normal.        Thought Content: Thought content normal.        Judgment: Judgment normal.     Lab Results  Component Value Date   WBC 10.7 (H) 06/03/2019   HGB 15.3 06/03/2019   HCT 45.9 06/03/2019   PLT 225.0 06/03/2019   GLUCOSE 112 (H) 06/30/2019   CHOL 185 06/30/2019   TRIG 123.0 06/30/2019   HDL 37.30 (L) 06/30/2019   LDLCALC 123 (H) 06/30/2019   ALT 15 06/03/2019   AST 11 06/03/2019   NA 138 06/30/2019   K 3.7 06/30/2019   CL 105 06/30/2019   CREATININE 0.90 06/30/2019   BUN 13 06/30/2019   CO2 25 06/30/2019   TSH 2.93 06/03/2019   PSA 0.47 06/03/2019   HGBA1C 5.3 11/23/2018    Exercise Tolerance Test  Result Date: 05/11/2019  There was no ST segment deviation noted during stress.  Normal study     Assessment & Plan:       Follow-up: No follow-ups on file.  05/13/2019, MD

## 2020-01-11 NOTE — Assessment & Plan Note (Signed)
On gel Labs

## 2020-01-11 NOTE — Assessment & Plan Note (Signed)
x 6 mo Likely multifactorial Labs

## 2020-01-11 NOTE — Assessment & Plan Note (Signed)
F/u w/Dr Kaur ?

## 2020-01-13 ENCOUNTER — Other Ambulatory Visit (INDEPENDENT_AMBULATORY_CARE_PROVIDER_SITE_OTHER): Payer: Self-pay

## 2020-01-13 DIAGNOSIS — R5383 Other fatigue: Secondary | ICD-10-CM

## 2020-01-13 DIAGNOSIS — E291 Testicular hypofunction: Secondary | ICD-10-CM

## 2020-01-13 DIAGNOSIS — F319 Bipolar disorder, unspecified: Secondary | ICD-10-CM

## 2020-01-13 LAB — URINALYSIS
Hgb urine dipstick: NEGATIVE
Ketones, ur: NEGATIVE
Leukocytes,Ua: NEGATIVE
Nitrite: NEGATIVE
Specific Gravity, Urine: 1.025 (ref 1.000–1.030)
Total Protein, Urine: NEGATIVE
Urine Glucose: NEGATIVE
Urobilinogen, UA: 0.2 (ref 0.0–1.0)
pH: 6 (ref 5.0–8.0)

## 2020-01-13 LAB — HEPATIC FUNCTION PANEL
ALT: 18 U/L (ref 0–53)
AST: 14 U/L (ref 0–37)
Albumin: 4.7 g/dL (ref 3.5–5.2)
Alkaline Phosphatase: 78 U/L (ref 39–117)
Bilirubin, Direct: 0.1 mg/dL (ref 0.0–0.3)
Total Bilirubin: 0.6 mg/dL (ref 0.2–1.2)
Total Protein: 7.4 g/dL (ref 6.0–8.3)

## 2020-01-13 LAB — BASIC METABOLIC PANEL
BUN: 14 mg/dL (ref 6–23)
CO2: 28 mEq/L (ref 19–32)
Calcium: 9.5 mg/dL (ref 8.4–10.5)
Chloride: 103 mEq/L (ref 96–112)
Creatinine, Ser: 0.98 mg/dL (ref 0.40–1.50)
GFR: 85.05 mL/min (ref >60.00)
Glucose, Bld: 83 mg/dL (ref 70–99)
Potassium: 4.2 mEq/L (ref 3.5–5.1)
Sodium: 142 mEq/L (ref 135–145)

## 2020-01-13 LAB — CBC WITH DIFFERENTIAL/PLATELET
Basophils Absolute: 0 10*3/uL (ref 0.0–0.1)
Basophils Relative: 0.2 % (ref 0.0–3.0)
Eosinophils Absolute: 0.3 10*3/uL (ref 0.0–0.7)
Eosinophils Relative: 3.8 % (ref 0.0–5.0)
HCT: 45.9 % (ref 39.0–52.0)
Hemoglobin: 15.5 g/dL (ref 13.0–17.0)
Lymphocytes Relative: 32.3 % (ref 12.0–46.0)
Lymphs Abs: 2.3 10*3/uL (ref 0.7–4.0)
MCHC: 33.8 g/dL (ref 30.0–36.0)
MCV: 85.9 fl (ref 78.0–100.0)
Monocytes Absolute: 0.5 10*3/uL (ref 0.1–1.0)
Monocytes Relative: 7.6 % (ref 3.0–12.0)
Neutro Abs: 4 10*3/uL (ref 1.4–7.7)
Neutrophils Relative %: 56.1 % (ref 43.0–77.0)
Platelets: 185 10*3/uL (ref 150.0–400.0)
RBC: 5.35 Mil/uL (ref 4.22–5.81)
RDW: 13.8 % (ref 11.5–15.5)
WBC: 7.1 10*3/uL (ref 4.0–10.5)

## 2020-01-13 LAB — LIPID PANEL
Cholesterol: 237 mg/dL — ABNORMAL HIGH (ref 0–200)
HDL: 41 mg/dL (ref >39.00)
LDL Cholesterol: 168 mg/dL — ABNORMAL HIGH (ref 0–99)
NonHDL: 196.34
Total CHOL/HDL Ratio: 6
Triglycerides: 140 mg/dL (ref 0.0–149.0)
VLDL: 28 mg/dL (ref 0.0–40.0)

## 2020-01-13 LAB — VITAMIN B12: Vitamin B-12: 683 pg/mL (ref 211–911)

## 2020-01-13 LAB — PSA: PSA: 0.46 ng/mL (ref 0.10–4.00)

## 2020-01-13 LAB — TSH: TSH: 1.73 u[IU]/mL (ref 0.35–4.50)

## 2020-01-13 LAB — HEMOGLOBIN A1C: Hgb A1c MFr Bld: 5.4 % (ref 4.6–6.5)

## 2020-01-13 LAB — TESTOSTERONE: Testosterone: 1145.08 ng/dL — ABNORMAL HIGH (ref 300.00–890.00)

## 2020-01-13 LAB — VITAMIN D 25 HYDROXY (VIT D DEFICIENCY, FRACTURES): VITD: 26.84 ng/mL — ABNORMAL LOW (ref 30.00–100.00)

## 2020-01-13 LAB — T4, FREE: Free T4: 0.95 ng/dL (ref 0.60–1.60)

## 2020-01-13 LAB — CORTISOL: Cortisol, Plasma: 10.2 ug/dL

## 2020-01-15 ENCOUNTER — Other Ambulatory Visit: Payer: Self-pay | Admitting: Cardiology

## 2020-01-15 ENCOUNTER — Other Ambulatory Visit: Payer: Self-pay | Admitting: Internal Medicine

## 2020-01-15 DIAGNOSIS — R600 Localized edema: Secondary | ICD-10-CM

## 2020-01-15 MED ORDER — VITAMIN D3 50 MCG (2000 UT) PO CAPS
2000.0000 [IU] | ORAL_CAPSULE | Freq: Every day | ORAL | 3 refills | Status: AC
Start: 2020-01-15 — End: ?

## 2020-01-15 MED ORDER — VITAMIN D3 1.25 MG (50000 UT) PO CAPS: 1.0000 | ORAL_CAPSULE | ORAL | 0 refills | Status: DC

## 2020-01-20 ENCOUNTER — Ambulatory Visit (INDEPENDENT_AMBULATORY_CARE_PROVIDER_SITE_OTHER): Payer: Self-pay | Admitting: Otolaryngology

## 2020-01-20 ENCOUNTER — Other Ambulatory Visit: Payer: Self-pay

## 2020-01-20 DIAGNOSIS — J342 Deviated nasal septum: Secondary | ICD-10-CM

## 2020-01-20 DIAGNOSIS — J310 Chronic rhinitis: Secondary | ICD-10-CM

## 2020-01-20 NOTE — Progress Notes (Signed)
HPI: Herbert Bryant is a 39 y.o. male who presents for evaluation of chronic nasal obstruction.  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  He also has history of allergies and uses Nasacort and Zyrtec.  He has already used Afrin today.  Today complains slightly more congestion on the left side but it alternates from side to side.  Past Medical History:  Diagnosis Date  . Acne   . ADD (attention deficit disorder)   . ADHD (attention deficit hyperactivity disorder)   . Allergy   . Anxiety   . Bipolar 1 disorder (HCC)   . Depression   . GERD (gastroesophageal reflux disease)   . Hypertension   . IBS (irritable bowel syndrome)    Past Surgical History:  Procedure Laterality Date  . MOUTH SURGERY  1993   skin graft  . WISDOM TOOTH EXTRACTION     Social History   Socioeconomic History  . Marital status: Single    Spouse name: Not on file  . Number of children: 0  . Years of education: Not on file  . Highest education level: Bachelor's degree (e.g., BA, AB, BS)  Occupational History  . Not on file  Tobacco Use  . Smoking status: Former Smoker    Packs/day: 0.50    Types: Cigarettes    Quit date: 06/25/2012    Years since quitting: 7.5  . Smokeless tobacco: Never Used  Vaping Use  . Vaping Use: Never used  Substance and Sexual Activity  . Alcohol use: Yes    Alcohol/week: 0.0 standard drinks    Comment: occasional  . Drug use: Yes    Types: Marijuana    Comment: use on daily basis  . Sexual activity: Yes    Birth control/protection: None  Other Topics Concern  . Not on file  Social History Narrative   Lives at home alone.  He is unemployed.  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     Social Determinants of Health   Financial Resource Strain:   . Difficulty of Paying Living Expenses:   Food Insecurity:   . Worried About Programme researcher, broadcasting/film/video in the Last Year:   . Barista in the Last Year:    Transportation Needs:   . Freight forwarder (Medical):   Marland Kitchen Lack of Transportation (Non-Medical):   Physical Activity:   . Days of Exercise per Week:   . Minutes of Exercise per Session:   Stress:   . Feeling of Stress :   Social Connections:   . Frequency of Communication with Friends and Family:   . Frequency of Social Gatherings with Friends and Family:   . Attends Religious Services:   . Active Member of Clubs or Organizations:   . Attends Banker Meetings:   Marland Kitchen Marital Status:    Family History  Problem Relation Age of Onset  . Arthritis Maternal Grandmother   . Stroke Maternal Grandmother   . Hypertension Maternal Grandmother   . Alzheimer's disease Maternal Grandmother   . Arthritis Maternal Grandfather   . Alzheimer's disease Maternal Grandfather   . Emphysema Paternal Grandfather   . Alcohol abuse Mother   . Depression Mother   . Skin cancer Mother   . Skin cancer Father    Allergies  Allergen Reactions  . Losartan     Diarrhea, CP, fatigue   Prior to Admission medications  Medication Sig Start Date End Date Taking? Authorizing Provider  Acetylcysteine (NAC PO) Take by mouth.    [provider]  ALPRAZolam Prudy Feeler) 0.5 MG tablet Take 0.25-0.5 mg by mouth 3 (three) times daily as needed. Anxiety     [provider]  buPROPion (WELLBUTRIN XL) 150 MG 24 hr tablet Take 150 mg by mouth every morning. Patient not taking: Reported on 01/11/2020 08/29/19   [provider]  buPROPion (WELLBUTRIN XL) 300 MG 24 hr tablet Take 300 mg by mouth every morning. Patient not taking: Reported on 01/11/2020 06/16/19   [provider]  cetirizine (ZYRTEC) 10 MG tablet Take 10 mg by mouth daily.    [provider]  Cholecalciferol (VITAMIN D3) 1.25 MG (50000 UT) CAPS Take 1 capsule by mouth once a week. 01/15/20   Plotnikov, Georgina Quint, MD  Cholecalciferol (VITAMIN D3) 50 MCG (2000 UT) capsule Take 1 capsule (2,000 Units total)  by mouth daily. 01/15/20   Plotnikov, Georgina Quint, MD  Cyanocobalamin (VITAMIN B-12) 500 MCG SUBL Place 1 tablet (500 mcg total) under the tongue 1 day or 1 dose. 01/26/18   Plotnikov, Georgina Quint, MD  finasteride (PROPECIA) 1 MG tablet One quarter tablet daily 01/20/19   Plotnikov, Georgina Quint, MD  fish oil-omega-3 fatty acids 1000 MG capsule Take 1 g by mouth daily.     [provider]  furosemide (LASIX) 20 MG tablet TAKE 1 TABLET BY MOUTH EVERY DAY 01/16/20   Lewayne Bunting, MD  GuanFACINE HCl 3 MG TB24 Take 1 tablet by mouth daily. 12/28/19   [provider]  Lifitegrast Benay Spice) 5 % SOLN Apply 1 drop to eye in the morning and at bedtime.    [provider]  MINOXIDIL, TOPICAL, (ROGAINE EXTRA STRENGTH) 5 % SOLN Apply topically 2 (two) times a day.    [provider]  Multiple Minerals-Vitamins (CITRACAL PLUS PO) Take by mouth daily.    [provider]  omeprazole (PRILOSEC) 20 MG capsule Take 20 mg by mouth daily.    [provider]  oxymetazoline (AFRIN) 0.05 % nasal spray Place 1 spray into both nostrils 3 (three) times daily.    [provider]  Tasimelteon (HETLIOZ) 20 MG CAPS Take by mouth at bedtime.     [provider]  Testosterone 20.25 MG/ACT (1.62%) GEL PLACE 3 PUMPS ONTO THE SKIN EVERY MORNING. 11/29/19   Plotnikov, Georgina Quint, MD  traZODone (DESYREL) 50 MG tablet Take 150 mg by mouth at bedtime. 03/14/18   [provider]  tretinoin (RETIN-A) 0.025 % cream SMARTSIG:Topical Every Evening 11/17/19   [provider]  Triamcinolone Acetonide (NASACORT AQ NA) Place into the nose. Patient not taking: Reported on 01/11/2020    [provider]  triamcinolone ointment (KENALOG) 0.1 % Apply 1 application topically 2 (two) times daily. Patient not taking: Reported on 01/11/2020 11/16/19   [provider]     Positive ROS: Otherwise negative  All other systems have been reviewed and were otherwise  negative with the exception of those mentioned in the HPI and as above.  Physical Exam: Constitutional: Alert, well-appearing, no acute distress Ears: External ears without lesions or tenderness. Ear canals are clear bilaterally with intact, clear TMs.  Nasal: External nose without lesions. Septum deviated anteriorly to the right and more posteriorly to the left.  On nasal endoscopy both middle meatus regions were clear with no signs of infection or polyps.  Posterior nasal cavity was clear with no polyps and  no nasopharyngeal abnormality noted..  No signs of infection Oral: Lips and gums without lesions. Tongue and palate mucosa without lesions. Posterior oropharynx clear. Neck: No palpable adenopathy or masses Respiratory: Breathing comfortably  Skin: No facial/neck lesions or rash noted.  Nasal/sinus endoscopy  Date/Time: 01/20/2020 4:30 PM Performed by: Drema Halon, MD Authorized by: Drema Halon, MD   Consent:    Consent obtained:  Verbal   Consent given by:  Patient Procedure details:    Indications: sino-nasal symptoms     Instrument: flexible fiberoptic nasal endoscope     Scope location: bilateral nare   Septum:    Deviation: deviated to the left     Severity of deviation: intermediate   Sinus:    Right middle meatus: normal     Left middle meatus: normal     Right nasopharynx: normal     Left nasopharynx: normal   Comments:     On nasal endoscopy patient had mild to moderate nasal septal deviation.  No polyps noted.  Nasopharynx was clear of both middle meatus regions were clear.  Turbinates are presently small and not obstructing as he is used Afrin recently.    Assessment: Addition to Afrin use Moderate septal deviation  Plan: Discussed with him initially trying to get off the Afrin.  Would recommend regular use of Nasacort 2 sprays each nostril twice daily as well as use of saline irrigation.  He can try saline rinse with a little bit of Afrin  to taper off the Afrin. Can also use Zyrtec D or other decongestant for a week or 2 to help with congestion. When he is off the Afrin if he has persistent congestion could consider surgical intervention which would be septoplasty and turbinate reductions but I would like for him to get off the Afrin first.  Narda Bonds, MD

## 2020-01-23 ENCOUNTER — Encounter: Payer: Self-pay | Admitting: General Practice

## 2020-01-31 ENCOUNTER — Encounter: Payer: Self-pay | Admitting: Podiatry

## 2020-02-02 MED ORDER — BETAMETHASONE DIPROPIONATE 0.05 % EX CREA
TOPICAL_CREAM | Freq: Every day | CUTANEOUS | 1 refills | Status: DC
Start: 2020-02-02 — End: 2020-12-28

## 2020-02-07 ENCOUNTER — Ambulatory Visit: Payer: Self-pay | Admitting: Psychology

## 2020-02-16 ENCOUNTER — Ambulatory Visit (INDEPENDENT_AMBULATORY_CARE_PROVIDER_SITE_OTHER): Payer: BC Managed Care – PPO | Admitting: Psychology

## 2020-02-16 DIAGNOSIS — F481 Depersonalization-derealization syndrome: Secondary | ICD-10-CM

## 2020-02-16 DIAGNOSIS — F332 Major depressive disorder, recurrent severe without psychotic features: Secondary | ICD-10-CM | POA: Diagnosis not present

## 2020-02-19 ENCOUNTER — Other Ambulatory Visit: Payer: Self-pay | Admitting: Internal Medicine

## 2020-02-22 DIAGNOSIS — F4322 Adjustment disorder with anxiety: Secondary | ICD-10-CM | POA: Diagnosis not present

## 2020-02-24 DIAGNOSIS — N411 Chronic prostatitis: Secondary | ICD-10-CM | POA: Diagnosis not present

## 2020-02-28 ENCOUNTER — Telehealth: Payer: Self-pay | Admitting: Internal Medicine

## 2020-02-28 ENCOUNTER — Ambulatory Visit (INDEPENDENT_AMBULATORY_CARE_PROVIDER_SITE_OTHER): Payer: BC Managed Care – PPO | Admitting: Internal Medicine

## 2020-02-28 ENCOUNTER — Encounter: Payer: Self-pay | Admitting: Internal Medicine

## 2020-02-28 ENCOUNTER — Other Ambulatory Visit: Payer: Self-pay

## 2020-02-28 DIAGNOSIS — R5383 Other fatigue: Secondary | ICD-10-CM

## 2020-02-28 DIAGNOSIS — F319 Bipolar disorder, unspecified: Secondary | ICD-10-CM

## 2020-02-28 DIAGNOSIS — E291 Testicular hypofunction: Secondary | ICD-10-CM

## 2020-02-28 DIAGNOSIS — I10 Essential (primary) hypertension: Secondary | ICD-10-CM | POA: Diagnosis not present

## 2020-02-28 MED ORDER — PSEUDOEPHEDRINE HCL ER 120 MG PO TB12: 120.0000 mg | ORAL_TABLET | Freq: Two times a day (BID) | ORAL | 1 refills | Status: DC | PRN

## 2020-02-28 MED ORDER — LIOTHYRONINE SODIUM 5 MCG PO TABS: 5.0000 ug | ORAL_TABLET | Freq: Every day | ORAL | 3 refills | Status: DC

## 2020-02-28 NOTE — Assessment & Plan Note (Addendum)
He just saw Dr Evelene Croon - fatigue was discussed Pristiq was new. Xanax was decreased.Intunive was decreased. Will add cytomel

## 2020-02-28 NOTE — Telephone Encounter (Signed)
New message:   Pt is calling and states Dr. Macario Golds wants him to start on some thyroid medication. Pt states since he is needing medication for his thyroid he is wondering is it good for him to see a Endocrinologist. If so the pt would like to have a referral to a Dr. Rosie Fate. Please advise if and when the referral has been placed.

## 2020-02-28 NOTE — Assessment & Plan Note (Signed)
Wt Readings from Last 3 Encounters:  02/28/20 (!) 302 lb (137 kg)  01/11/20 (!) 309 lb (140.2 kg)  07/06/19 299 lb (135.6 kg)

## 2020-02-28 NOTE — Assessment & Plan Note (Signed)
He just saw Dr Evelene Croon - fatigue was discussed Pristiq was new. Xanax was decreased.Intunive was decreased.

## 2020-02-28 NOTE — Telephone Encounter (Signed)
Pt wanting referral to endocrinologist- Dr. Aurora Mask

## 2020-02-28 NOTE — Progress Notes (Signed)
Subjective:  Patient ID: Herbert Bryant, male    DOB: 11/18/1980  Age: 39 y.o. MRN: [id number]  CC: No chief complaint on file.   HPI [name] presents for fatigue, forgetfulness, insomnia f/u He just saw Dr Evelene Croon - fatigue was discussed Pristiq was new. Xanax was decreased.Intunive was decreased. C/o congestion  Outpatient Medications Prior to Visit  Medication Sig Dispense Refill  . Acetylcysteine (NAC PO) Take by mouth.    . ALPRAZolam (XANAX) 0.5 MG tablet Take 0.25-0.5 mg by mouth 3 (three) times daily as needed. Anxiety     . betamethasone dipropionate 0.05 % cream Apply topically daily. 45 g 1  . cetirizine (ZYRTEC) 10 MG tablet Take 10 mg by mouth daily.    . Cholecalciferol (VITAMIN D3) 50 MCG (2000 UT) capsule Take 1 capsule (2,000 Units total) by mouth daily. 100 capsule 3  . Cyanocobalamin (VITAMIN B-12) 500 MCG SUBL Place 1 tablet (500 mcg total) under the tongue 1 day or 1 dose. 100 tablet 3  . Desvenlafaxine Succinate ER (PRISTIQ) 25 MG TB24 Take 25 mg by mouth.    . finasteride (PROPECIA) 1 MG tablet One quarter tablet daily 90 tablet 3  . fish oil-omega-3 fatty acids 1000 MG capsule Take 1 g by mouth daily.     . GuanFACINE HCl 3 MG TB24 Take 2 mg by mouth daily.     Marland Kitchen Lifitegrast (XIIDRA) 5 % SOLN Apply 1 drop to eye in the morning and at bedtime.    Marland Kitchen MINOXIDIL, TOPICAL, (ROGAINE EXTRA STRENGTH) 5 % SOLN Apply topically 2 (two) times a day.    . Multiple Minerals-Vitamins (CITRACAL PLUS PO) Take by mouth daily.    Marland Kitchen omeprazole (PRILOSEC) 20 MG capsule Take 20 mg by mouth daily.    Marland Kitchen oxymetazoline (AFRIN) 0.05 % nasal spray Place 1 spray into both nostrils 3 (three) times daily.    . Tasimelteon (HETLIOZ) 20 MG CAPS Take by mouth at bedtime.     . Testosterone 20.25 MG/ACT (1.62%) GEL PLACE 3 PUMPS ONTO THE SKIN EVERY MORNING. 150 g 0  . traZODone (DESYREL) 50 MG tablet Take 150 mg by mouth at bedtime.  1  . tretinoin (RETIN-A) 0.025 % cream  SMARTSIG:Topical Every Evening    . Triamcinolone Acetonide (NASACORT AQ NA) Place into the nose.     Marland Kitchen buPROPion (WELLBUTRIN XL) 150 MG 24 hr tablet Take 150 mg by mouth every morning.  (Patient not taking: Reported on 02/28/2020)    . buPROPion (WELLBUTRIN XL) 300 MG 24 hr tablet Take 300 mg by mouth every morning.  (Patient not taking: Reported on 02/28/2020)    . Cholecalciferol (VITAMIN D3) 1.25 MG (50000 UT) CAPS TAKE 1 CAPSULE BY MOUTH ONE TIME PER WEEK (Patient not taking: Reported on 02/28/2020) 6 capsule 0  . furosemide (LASIX) 20 MG tablet TAKE 1 TABLET BY MOUTH EVERY DAY (Patient not taking: Reported on 02/28/2020) 90 tablet 3  . triamcinolone ointment (KENALOG) 0.1 % Apply 1 application topically 2 (two) times daily.  (Patient not taking: Reported on 02/28/2020)     No facility-administered medications prior to visit.    ROS: Review of Systems  Constitutional: Positive for fatigue. Negative for appetite change and unexpected weight change.  HENT: Negative for congestion, nosebleeds, sneezing, sore throat and trouble swallowing.   Eyes: Negative for itching and visual disturbance.  Respiratory: Negative for cough.   Cardiovascular: Negative for chest pain, palpitations and leg swelling.  Gastrointestinal: Negative for abdominal  distention, blood in stool, diarrhea and nausea.  Genitourinary: Negative for frequency and hematuria.  Musculoskeletal: Negative for back pain, gait problem, joint swelling and neck pain.  Skin: Negative for rash.  Neurological: Negative for dizziness, tremors, speech difficulty and weakness.  Psychiatric/Behavioral: Positive for dysphoric mood and sleep disturbance. Negative for agitation and suicidal ideas. The patient is not nervous/anxious.     Objective:  BP 138/90 (BP Location: Right Arm, Patient Position: Sitting, Cuff Size: Large)   Pulse 82   Temp 98.5 F (36.9 C) (Oral)   Ht 5\' 10"  (1.778 m)   Wt (!) 302 lb (137 kg)   SpO2 97%   BMI 43.33  kg/m   BP Readings from Last 3 Encounters:  02/28/20 138/90  01/11/20 140/90  07/06/19 (!) 135/91    Wt Readings from Last 3 Encounters:  02/28/20 (!) 302 lb (137 kg)  01/11/20 (!) 309 lb (140.2 kg)  07/06/19 299 lb (135.6 kg)    Physical Exam Constitutional:      General: He is not in acute distress.    Appearance: He is well-developed. He is obese.     Comments: NAD  Eyes:     Conjunctiva/sclera: Conjunctivae normal.     Pupils: Pupils are equal, round, and reactive to light.  Neck:     Thyroid: No thyromegaly.     Vascular: No JVD.  Cardiovascular:     Rate and Rhythm: Normal rate and regular rhythm.     Heart sounds: Normal heart sounds. No murmur heard.  No friction rub. No gallop.   Pulmonary:     Effort: Pulmonary effort is normal. No respiratory distress.     Breath sounds: Normal breath sounds. No wheezing or rales.  Chest:     Chest wall: No tenderness.  Abdominal:     General: Bowel sounds are normal. There is no distension.     Palpations: Abdomen is soft. There is no mass.     Tenderness: There is no abdominal tenderness. There is no guarding or rebound.  Musculoskeletal:        General: No tenderness. Normal range of motion.     Cervical back: Normal range of motion.  Lymphadenopathy:     Cervical: No cervical adenopathy.  Skin:    General: Skin is warm and dry.     Findings: No rash.  Neurological:     Mental Status: He is alert and oriented to person, place, and time.     Cranial Nerves: No cranial nerve deficit.     Motor: No abnormal muscle tone.     Coordination: Coordination normal.     Gait: Gait normal.     Deep Tendon Reflexes: Reflexes are normal and symmetric.  Psychiatric:        Behavior: Behavior normal.        Thought Content: Thought content normal.        Judgment: Judgment normal.     Lab Results  Component Value Date   WBC 7.1 01/13/2020   HGB 15.5 01/13/2020   HCT 45.9 01/13/2020   PLT 185.0 01/13/2020   GLUCOSE 83  01/13/2020   CHOL 237 (H) 01/13/2020   TRIG 140.0 01/13/2020   HDL 41.00 01/13/2020   LDLCALC 168 (H) 01/13/2020   ALT 18 01/13/2020   AST 14 01/13/2020   NA 142 01/13/2020   K 4.2 01/13/2020   CL 103 01/13/2020   CREATININE 0.98 01/13/2020   BUN 14 01/13/2020   CO2 28 01/13/2020  TSH 1.73 01/13/2020   PSA 0.46 01/13/2020   HGBA1C 5.4 01/13/2020    Exercise Tolerance Test  Result Date: 05/11/2019  There was no ST segment deviation noted during stress.  Normal study     Assessment & Plan:    Sonda Primes, MD

## 2020-02-28 NOTE — Assessment & Plan Note (Signed)
Stress test and ECHO were ok in 2020 Furosemide

## 2020-02-29 ENCOUNTER — Other Ambulatory Visit (INDEPENDENT_AMBULATORY_CARE_PROVIDER_SITE_OTHER): Payer: BC Managed Care – PPO

## 2020-02-29 DIAGNOSIS — R5383 Other fatigue: Secondary | ICD-10-CM

## 2020-02-29 DIAGNOSIS — F319 Bipolar disorder, unspecified: Secondary | ICD-10-CM

## 2020-02-29 LAB — BASIC METABOLIC PANEL
BUN: 16 mg/dL (ref 6–23)
CO2: 26 mEq/L (ref 19–32)
Calcium: 9.1 mg/dL (ref 8.4–10.5)
Chloride: 104 mEq/L (ref 96–112)
Creatinine, Ser: 0.89 mg/dL (ref 0.40–1.50)
GFR: 94.98 mL/min (ref >60.00)
Glucose, Bld: 94 mg/dL (ref 70–99)
Potassium: 4 mEq/L (ref 3.5–5.1)
Sodium: 139 mEq/L (ref 135–145)

## 2020-02-29 LAB — VITAMIN D 25 HYDROXY (VIT D DEFICIENCY, FRACTURES): VITD: 51.3 ng/mL (ref 30.00–100.00)

## 2020-02-29 LAB — TESTOSTERONE: Testosterone: 361.11 ng/dL (ref 300.00–890.00)

## 2020-02-29 LAB — T3, FREE: T3, Free: 4.2 pg/mL (ref 2.3–4.2)

## 2020-02-29 NOTE — Telephone Encounter (Signed)
Will refer.  Thank you.

## 2020-03-01 ENCOUNTER — Other Ambulatory Visit: Payer: Self-pay | Admitting: Internal Medicine

## 2020-03-01 ENCOUNTER — Telehealth: Payer: Self-pay | Admitting: Neurology

## 2020-03-01 DIAGNOSIS — E291 Testicular hypofunction: Secondary | ICD-10-CM

## 2020-03-01 NOTE — Telephone Encounter (Signed)
Pt has not been seen since 2019 office visit would be needed to address these questions.  Pt was sent a my chart message asking if he could come to the office on 04/23/2020 at 1 pm. This is Dr. Teofilo Pod next available at present.

## 2020-03-01 NOTE — Telephone Encounter (Signed)
Pt called stating that he is having trouble falling asleep and staying asleep and he is needing to discuss with RN. Please advise.

## 2020-03-02 NOTE — Telephone Encounter (Signed)
F/u  Checking on the status of prescription  

## 2020-03-02 NOTE — Telephone Encounter (Signed)
New message:   Pt is calling in reference to his Testosterone 20.25 MG/ACT (1.62%) GEL and pt states he will be out before the end of the weekend. Pt would like to know if another physician can authorize this refill. Please advise.

## 2020-03-06 ENCOUNTER — Telehealth: Payer: Self-pay | Admitting: Internal Medicine

## 2020-03-06 NOTE — Telephone Encounter (Signed)
New message:   Pt is calling to see if we got the PA request from the pharmacy for his Testosterone 20.25 MG/ACT (1.62%) GEL. Please advise.

## 2020-03-07 NOTE — Telephone Encounter (Signed)
Patient calling for status of prior auth  

## 2020-03-09 NOTE — Telephone Encounter (Signed)
PA needed for testosterone prescription.

## 2020-03-12 NOTE — Telephone Encounter (Signed)
PA initiated Key: [id number]

## 2020-03-12 NOTE — Telephone Encounter (Signed)
Prescription for 3 pumps already In and PA initiated

## 2020-03-15 NOTE — Telephone Encounter (Signed)
Prior Authorization has been approved  from 03/12/2020 through 07/13/2038

## 2020-03-20 DIAGNOSIS — G4700 Insomnia, unspecified: Secondary | ICD-10-CM | POA: Diagnosis not present

## 2020-03-20 DIAGNOSIS — F3341 Major depressive disorder, recurrent, in partial remission: Secondary | ICD-10-CM | POA: Diagnosis not present

## 2020-04-10 ENCOUNTER — Other Ambulatory Visit: Payer: Self-pay | Admitting: Internal Medicine

## 2020-04-10 DIAGNOSIS — L649 Androgenic alopecia, unspecified: Secondary | ICD-10-CM

## 2020-04-12 ENCOUNTER — Other Ambulatory Visit: Payer: Self-pay | Admitting: Internal Medicine

## 2020-04-12 DIAGNOSIS — L649 Androgenic alopecia, unspecified: Secondary | ICD-10-CM

## 2020-04-23 ENCOUNTER — Ambulatory Visit: Payer: Self-pay | Admitting: Neurology

## 2020-05-03 ENCOUNTER — Ambulatory Visit: Payer: Self-pay | Admitting: Neurology

## 2020-05-03 ENCOUNTER — Encounter: Payer: Self-pay | Admitting: Neurology

## 2020-05-03 ENCOUNTER — Telehealth: Payer: Self-pay | Admitting: *Deleted

## 2020-05-03 NOTE — Telephone Encounter (Addendum)
Patient was no show for follow up today. Of note, he was late cancellation for follow up on 04/23/20.

## 2020-05-15 ENCOUNTER — Ambulatory Visit: Payer: BC Managed Care – PPO | Admitting: Endocrinology

## 2020-05-28 DIAGNOSIS — F410 Panic disorder [episodic paroxysmal anxiety] without agoraphobia: Secondary | ICD-10-CM | POA: Diagnosis not present

## 2020-05-28 DIAGNOSIS — G4700 Insomnia, unspecified: Secondary | ICD-10-CM | POA: Diagnosis not present

## 2020-05-28 DIAGNOSIS — F341 Dysthymic disorder: Secondary | ICD-10-CM | POA: Diagnosis not present

## 2020-06-11 ENCOUNTER — Encounter: Payer: Self-pay | Admitting: Endocrinology

## 2020-06-21 DIAGNOSIS — F3341 Major depressive disorder, recurrent, in partial remission: Secondary | ICD-10-CM | POA: Diagnosis not present

## 2020-08-08 ENCOUNTER — Other Ambulatory Visit: Payer: Self-pay | Admitting: Internal Medicine

## 2020-08-08 DIAGNOSIS — E291 Testicular hypofunction: Secondary | ICD-10-CM

## 2020-08-09 NOTE — Telephone Encounter (Signed)
Check [place] registry last filled 07/10/2020../[email or web address]  

## 2020-10-01 DIAGNOSIS — N453 Epididymo-orchitis: Secondary | ICD-10-CM | POA: Diagnosis not present

## 2020-10-01 DIAGNOSIS — N5082 Scrotal pain: Secondary | ICD-10-CM | POA: Diagnosis not present

## 2020-10-02 DIAGNOSIS — N50812 Left testicular pain: Secondary | ICD-10-CM | POA: Diagnosis not present

## 2020-11-19 DIAGNOSIS — G4700 Insomnia, unspecified: Secondary | ICD-10-CM | POA: Diagnosis not present

## 2020-11-19 DIAGNOSIS — F3342 Major depressive disorder, recurrent, in full remission: Secondary | ICD-10-CM | POA: Diagnosis not present

## 2020-11-19 DIAGNOSIS — F341 Dysthymic disorder: Secondary | ICD-10-CM | POA: Diagnosis not present

## 2020-12-28 ENCOUNTER — Other Ambulatory Visit: Payer: Self-pay

## 2020-12-28 ENCOUNTER — Ambulatory Visit (INDEPENDENT_AMBULATORY_CARE_PROVIDER_SITE_OTHER): Payer: BC Managed Care – PPO | Admitting: Internal Medicine

## 2020-12-28 ENCOUNTER — Encounter: Payer: Self-pay | Admitting: Internal Medicine

## 2020-12-28 DIAGNOSIS — R21 Rash and other nonspecific skin eruption: Secondary | ICD-10-CM

## 2020-12-28 MED ORDER — NYSTATIN 100000 UNIT/GM EX OINT: 1.0000 "application " | TOPICAL_OINTMENT | Freq: Three times a day (TID) | CUTANEOUS | 2 refills | Status: DC

## 2020-12-28 NOTE — Assessment & Plan Note (Signed)
Rx nystatin ointment to use on the feet. He has tried several steroid creams without improvement.

## 2020-12-28 NOTE — Patient Instructions (Signed)
We have sent in the nystatin ointment to use 2-3 times a day on the rash area which should help.

## 2020-12-28 NOTE — Progress Notes (Signed)
   Subjective:   Patient ID: Herbert Bryant, male    DOB: 08/29/1980, 40 y.o.   MRN: [id number]  HPI The patient is a 40 YO man coming in for concerns about his feet and fingers. Concerned about fungal changes. Started months ago and has seen podiatry and dermatology. They were not particularly interested and gave him several different steroid creams which have not helped. Occasionally itching and not hurting.   Review of Systems  Constitutional: Negative.   HENT: Negative.    Respiratory:  Negative for cough, chest tightness and shortness of breath.   Cardiovascular:  Negative for chest pain, palpitations and leg swelling.  Gastrointestinal:  Negative for abdominal distention, abdominal pain, constipation, diarrhea, nausea and vomiting.  Musculoskeletal: Negative.   Skin:  Positive for rash.   Objective:  Physical Exam Constitutional:      Appearance: He is well-developed.  HENT:     Head: Normocephalic and atraumatic.  Cardiovascular:     Rate and Rhythm: Normal rate and regular rhythm.  Pulmonary:     Effort: Pulmonary effort is normal. No respiratory distress.     Breath sounds: Normal breath sounds. No wheezing or rales.  Abdominal:     General: Bowel sounds are normal. There is no distension.     Palpations: Abdomen is soft.     Tenderness: There is no abdominal tenderness. There is no rebound.  Musculoskeletal:     Cervical back: Normal range of motion.  Skin:    General: Skin is warm and dry.     Findings: Rash present.     Comments: Rash bilateral on the 1-3rd toes, nails without fungal changes, red and raised  Neurological:     Mental Status: He is alert and oriented to person, place, and time.     Coordination: Coordination normal.    Vitals:   12/28/20 0802  BP: (!) 150/80  Pulse: 96  Temp: 99 F (37.2 C)  TempSrc: Oral  SpO2: 97%  Weight: 295 lb 9.6 oz (134.1 kg)  Height: 5\' 10"  (1.778 m)    This visit occurred during the SARS-CoV-2 public health  emergency.  Safety protocols were in place, including screening questions prior to the visit, additional usage of staff PPE, and extensive cleaning of exam room while observing appropriate contact time as indicated for disinfecting solutions.   Assessment & Plan:

## 2021-02-08 DIAGNOSIS — F900 Attention-deficit hyperactivity disorder, predominantly inattentive type: Secondary | ICD-10-CM | POA: Diagnosis not present

## 2021-02-08 DIAGNOSIS — F341 Dysthymic disorder: Secondary | ICD-10-CM | POA: Diagnosis not present

## 2021-04-02 DIAGNOSIS — G4720 Circadian rhythm sleep disorder, unspecified type: Secondary | ICD-10-CM | POA: Diagnosis not present

## 2021-04-02 DIAGNOSIS — F900 Attention-deficit hyperactivity disorder, predominantly inattentive type: Secondary | ICD-10-CM | POA: Diagnosis not present

## 2021-04-02 DIAGNOSIS — F3341 Major depressive disorder, recurrent, in partial remission: Secondary | ICD-10-CM | POA: Diagnosis not present

## 2021-04-11 ENCOUNTER — Other Ambulatory Visit: Payer: Self-pay | Admitting: Internal Medicine

## 2021-04-11 DIAGNOSIS — E291 Testicular hypofunction: Secondary | ICD-10-CM

## 2021-04-24 DIAGNOSIS — F3341 Major depressive disorder, recurrent, in partial remission: Secondary | ICD-10-CM | POA: Diagnosis not present

## 2021-05-09 DIAGNOSIS — L301 Dyshidrosis [pompholyx]: Secondary | ICD-10-CM | POA: Diagnosis not present

## 2021-05-09 DIAGNOSIS — L719 Rosacea, unspecified: Secondary | ICD-10-CM | POA: Diagnosis not present

## 2021-05-09 DIAGNOSIS — L817 Pigmented purpuric dermatosis: Secondary | ICD-10-CM | POA: Diagnosis not present

## 2021-05-09 DIAGNOSIS — L821 Other seborrheic keratosis: Secondary | ICD-10-CM | POA: Diagnosis not present

## 2021-05-16 ENCOUNTER — Encounter: Payer: Self-pay | Admitting: Internal Medicine

## 2021-05-16 ENCOUNTER — Other Ambulatory Visit: Payer: Self-pay

## 2021-05-16 ENCOUNTER — Ambulatory Visit (INDEPENDENT_AMBULATORY_CARE_PROVIDER_SITE_OTHER): Payer: BC Managed Care – PPO | Admitting: Internal Medicine

## 2021-05-16 VITALS — BP 148/80 | HR 92 | Temp 98.6°F | Ht 70.0 in | Wt 284.4 lb

## 2021-05-16 DIAGNOSIS — E291 Testicular hypofunction: Secondary | ICD-10-CM

## 2021-05-16 DIAGNOSIS — F319 Bipolar disorder, unspecified: Secondary | ICD-10-CM | POA: Diagnosis not present

## 2021-05-16 DIAGNOSIS — I10 Essential (primary) hypertension: Secondary | ICD-10-CM

## 2021-05-16 DIAGNOSIS — G479 Sleep disorder, unspecified: Secondary | ICD-10-CM | POA: Diagnosis not present

## 2021-05-16 LAB — COMPREHENSIVE METABOLIC PANEL
ALT: 28 U/L (ref 0–53)
AST: 19 U/L (ref 0–37)
Albumin: 4.6 g/dL (ref 3.5–5.2)
Alkaline Phosphatase: 91 U/L (ref 39–117)
BUN: 17 mg/dL (ref 6–23)
CO2: 27 mEq/L (ref 19–32)
Calcium: 9.4 mg/dL (ref 8.4–10.5)
Chloride: 101 mEq/L (ref 96–112)
Creatinine, Ser: 1.13 mg/dL (ref 0.40–1.50)
GFR: 81.27 mL/min (ref >60.00)
Glucose, Bld: 84 mg/dL (ref 70–99)
Potassium: 3.9 mEq/L (ref 3.5–5.1)
Sodium: 138 mEq/L (ref 135–145)
Total Bilirubin: 0.4 mg/dL (ref 0.2–1.2)
Total Protein: 7.2 g/dL (ref 6.0–8.3)

## 2021-05-16 LAB — LIPID PANEL
Cholesterol: 193 mg/dL (ref 0–200)
HDL: 46 mg/dL (ref >39.00)
LDL Cholesterol: 109 mg/dL — ABNORMAL HIGH (ref 0–99)
NonHDL: 146.73
Total CHOL/HDL Ratio: 4
Triglycerides: 190 mg/dL — ABNORMAL HIGH (ref 0.0–149.0)
VLDL: 38 mg/dL (ref 0.0–40.0)

## 2021-05-16 LAB — CBC WITH DIFFERENTIAL/PLATELET
Basophils Absolute: 0 10*3/uL (ref 0.0–0.1)
Basophils Relative: 0.3 % (ref 0.0–3.0)
Eosinophils Absolute: 0.2 10*3/uL (ref 0.0–0.7)
Eosinophils Relative: 2.2 % (ref 0.0–5.0)
HCT: 46.3 % (ref 39.0–52.0)
Hemoglobin: 15.4 g/dL (ref 13.0–17.0)
Lymphocytes Relative: 20.3 % (ref 12.0–46.0)
Lymphs Abs: 1.8 10*3/uL (ref 0.7–4.0)
MCHC: 33.2 g/dL (ref 30.0–36.0)
MCV: 87.1 fl (ref 78.0–100.0)
Monocytes Absolute: 0.4 10*3/uL (ref 0.1–1.0)
Monocytes Relative: 4 % (ref 3.0–12.0)
Neutro Abs: 6.5 10*3/uL (ref 1.4–7.7)
Neutrophils Relative %: 73.2 % (ref 43.0–77.0)
Platelets: 172 10*3/uL (ref 150.0–400.0)
RBC: 5.31 Mil/uL (ref 4.22–5.81)
RDW: 14.2 % (ref 11.5–15.5)
WBC: 8.9 10*3/uL (ref 4.0–10.5)

## 2021-05-16 LAB — URINALYSIS
Bilirubin Urine: NEGATIVE
Hgb urine dipstick: NEGATIVE
Ketones, ur: NEGATIVE
Leukocytes,Ua: NEGATIVE
Nitrite: NEGATIVE
Specific Gravity, Urine: 1.02 (ref 1.000–1.030)
Total Protein, Urine: NEGATIVE
Urine Glucose: NEGATIVE
Urobilinogen, UA: 0.2 (ref 0.0–1.0)
pH: 6 (ref 5.0–8.0)

## 2021-05-16 LAB — TSH: TSH: 1.7 u[IU]/mL (ref 0.35–5.50)

## 2021-05-16 LAB — PSA: PSA: 0.57 ng/mL (ref 0.10–4.00)

## 2021-05-16 LAB — TESTOSTERONE: Testosterone: 1163.03 ng/dL — ABNORMAL HIGH (ref 300.00–890.00)

## 2021-05-16 MED ORDER — LIOTHYRONINE SODIUM 5 MCG PO TABS: 5.0000 ug | ORAL_TABLET | Freq: Every day | ORAL | 5 refills | Status: DC

## 2021-05-16 MED ORDER — TESTOSTERONE 20.25 MG/ACT (1.62%) TD GEL: TRANSDERMAL | 5 refills | Status: DC

## 2021-05-16 NOTE — Assessment & Plan Note (Signed)
On Deplin, [place] Re-start Cytomel

## 2021-05-16 NOTE — Progress Notes (Signed)
Subjective:  Patient ID: Herbert Bryant, male    DOB: [birth year]  Age: 40 y.o. MRN: [id number]  CC: Follow-up (Need to discuss meds & refills)   HPI Herbert Bryant presents for depression, sleep disorder, hypogonadism  Outpatient Medications Prior to Visit  Medication Sig Dispense Refill   ALPRAZolam (XANAX) 0.5 MG tablet Take 0.25-0.5 mg by mouth 3 (three) times daily as needed. Anxiety      cetirizine (ZYRTEC) 10 MG tablet Take 10 mg by mouth daily.     Cholecalciferol (VITAMIN D3) 50 MCG (2000 UT) capsule Take 1 capsule (2,000 Units total) by mouth daily. 100 capsule 3   clobetasol ointment (TEMOVATE) 0.05 % Apply 1 application topically 2 (two) times daily. Apply topically twice a day     Cyanocobalamin (VITAMIN B-12) 500 MCG SUBL Place 1 tablet (500 mcg total) under the tongue 1 day or 1 dose. 100 tablet 3   finasteride (PROPECIA) 1 MG tablet ONE QUARTER TABLET DAILY 23 tablet 11   fish oil-omega-3 fatty acids 1000 MG capsule Take 1 g by mouth daily.      L-Methylfolate-Algae (DEPLIN 7.5 PO) Take 1 capsule by mouth daily.     Lifitegrast (XIIDRA) 5 % SOLN Apply 1 drop to eye in the morning and at bedtime.     MINOXIDIL, TOPICAL, 5 % SOLN Apply topically 2 (two) times a day.     MIRVASO 0.33 % GEL Apply topically. Apply topically every day     Multiple Minerals-Vitamins (CITRACAL PLUS PO) Take by mouth daily.     omeprazole (PRILOSEC) 20 MG capsule Take 20 mg by mouth daily.     Tasimelteon 20 MG CAPS Take by mouth at bedtime.      traZODone (DESYREL) 50 MG tablet Take 50-150 mg by mouth at bedtime as needed for sleep.     viloxazine ER (QELBREE) 200 MG 24 hr capsule Take 400 mg by mouth daily.     Testosterone 20.25 MG/ACT (1.62%) GEL PLACE 3 PUMPS ONTO THE SKIN EVERY MORNING. 150 g 0   Acetylcysteine (NAC PO) Take by mouth.     Cholecalciferol (VITAMIN D3) 1.25 MG (50000 UT) CAPS TAKE 1 CAPSULE BY MOUTH ONE TIME PER WEEK (Patient not taking: No sig reported) 6 capsule 0    Desvenlafaxine Succinate ER 25 MG TB24 Take 25 mg by mouth. (Patient not taking: Reported on 05/16/2021)     furosemide (LASIX) 20 MG tablet TAKE 1 TABLET BY MOUTH EVERY DAY (Patient not taking: No sig reported) 90 tablet 3   GuanFACINE HCl 3 MG TB24 Take 2 mg by mouth daily.  (Patient not taking: No sig reported)     liothyronine (CYTOMEL) 5 MCG tablet Take 1 tablet (5 mcg total) by mouth daily. (Patient not taking: No sig reported) 30 tablet 3   nystatin ointment (MYCOSTATIN) Apply 1 application topically 3 (three) times daily. (Patient not taking: Reported on 05/16/2021) 100 g 2   ramelteon (ROZEREM) 8 MG tablet Take 8 mg by mouth at bedtime. (Patient not taking: Reported on 05/16/2021)     tretinoin (RETIN-A) 0.025 % cream SMARTSIG:Topical Every Evening (Patient not taking: No sig reported)     Triamcinolone Acetonide (NASACORT AQ NA) Place into the nose.  (Patient not taking: Reported on 05/16/2021)     No facility-administered medications prior to visit.    ROS: Review of Systems  Constitutional:  Positive for fatigue. Negative for appetite change and unexpected weight change.  HENT:  Negative for congestion, nosebleeds,  sneezing, sore throat and trouble swallowing.   Eyes:  Negative for itching and visual disturbance.  Respiratory:  Negative for cough.   Cardiovascular:  Negative for chest pain, palpitations and leg swelling.  Gastrointestinal:  Negative for abdominal distention, blood in stool, diarrhea and nausea.  Genitourinary:  Negative for frequency and hematuria.  Musculoskeletal:  Negative for back pain, gait problem, joint swelling and neck pain.  Skin:  Negative for rash.  Neurological:  Negative for dizziness, tremors, speech difficulty and weakness.  Psychiatric/Behavioral:  Positive for decreased concentration and dysphoric mood. Negative for agitation, sleep disturbance and suicidal ideas. The patient is nervous/anxious.    Objective:  BP (!) 148/80 (BP Location: Left Arm)    Pulse 92   Temp 98.6 F (37 C) (Oral)   Ht 5\' 10"  (1.778 m)   Wt 284 lb 6.4 oz (129 kg)   SpO2 96%   BMI 40.81 kg/m   BP Readings from Last 3 Encounters:  05/16/21 (!) 148/80  12/28/20 (!) 150/80  02/28/20 138/90    Wt Readings from Last 3 Encounters:  05/16/21 284 lb 6.4 oz (129 kg)  12/28/20 295 lb 9.6 oz (134.1 kg)  02/28/20 (!) 302 lb (137 kg)    Physical Exam Constitutional:      General: He is not in acute distress.    Appearance: He is well-developed. He is obese.     Comments: NAD  Eyes:     Conjunctiva/sclera: Conjunctivae normal.     Pupils: Pupils are equal, round, and reactive to light.  Neck:     Thyroid: No thyromegaly.     Vascular: No JVD.  Cardiovascular:     Rate and Rhythm: Normal rate and regular rhythm.     Heart sounds: Normal heart sounds. No murmur heard.   No friction rub. No gallop.  Pulmonary:     Effort: Pulmonary effort is normal. No respiratory distress.     Breath sounds: Normal breath sounds. No wheezing or rales.  Chest:     Chest wall: No tenderness.  Abdominal:     General: Bowel sounds are normal. There is no distension.     Palpations: Abdomen is soft. There is no mass.     Tenderness: There is no abdominal tenderness. There is no guarding or rebound.  Musculoskeletal:        General: No tenderness. Normal range of motion.     Cervical back: Normal range of motion.  Lymphadenopathy:     Cervical: No cervical adenopathy.  Skin:    General: Skin is warm and dry.     Findings: No rash.  Neurological:     Mental Status: He is alert and oriented to person, place, and time.     Cranial Nerves: No cranial nerve deficit.     Motor: No abnormal muscle tone.     Coordination: Coordination normal.     Gait: Gait normal.     Deep Tendon Reflexes: Reflexes are normal and symmetric.  Psychiatric:        Behavior: Behavior normal.        Thought Content: Thought content normal.        Judgment: Judgment normal.    Lab Results   Component Value Date   WBC 7.1 01/13/2020   HGB 15.5 01/13/2020   HCT 45.9 01/13/2020   PLT 185.0 01/13/2020   GLUCOSE 94 02/29/2020   CHOL 237 (H) 01/13/2020   TRIG 140.0 01/13/2020   HDL 41.00 01/13/2020   LDLCALC 168 (  H) 01/13/2020   ALT 18 01/13/2020   AST 14 01/13/2020   NA 139 02/29/2020   K 4.0 02/29/2020   CL 104 02/29/2020   CREATININE 0.89 02/29/2020   BUN 16 02/29/2020   CO2 26 02/29/2020   TSH 1.73 01/13/2020   PSA 0.46 01/13/2020   HGBA1C 5.4 01/13/2020    Exercise Tolerance Test  Result Date: 05/11/2019  There was no ST segment deviation noted during stress.  Normal study     Assessment & Plan:   Problem List Items Addressed This Visit     Bipolar depression ([place])    On Deplin, Qelbree Re-start Cytomel      Relevant Orders   TSH   Urinalysis   CBC with Differential/Platelet   Lipid panel   PSA   Comprehensive metabolic panel   Testosterone   HTN (hypertension)    Diet/salt restriction controlled      Relevant Orders   TSH   Urinalysis   CBC with Differential/Platelet   Lipid panel   PSA   Comprehensive metabolic panel   Testosterone   Hypogonadism male - Primary    On testosterone      Relevant Medications   Testosterone 20.25 MG/ACT (1.62%) GEL   Other Relevant Orders   TSH   Urinalysis   CBC with Differential/Platelet   Lipid panel   PSA   Comprehensive metabolic panel   Testosterone   Sleep disorder    30 h awake - 15 h sleep cycle         Meds ordered this encounter  Medications   Testosterone 20.25 MG/ACT (1.62%) GEL    Sig: PLACE 3 PUMPS ONTO THE SKIN EVERY MORNING.    Dispense:  150 g    Refill:  5    .  Needs office visit   liothyronine (CYTOMEL) 5 MCG tablet    Sig: Take 1 tablet (5 mcg total) by mouth daily.    Dispense:  30 tablet    Refill:  5      Follow-up: Return in about 3 months (around 08/16/2021) for Wellness Exam.  Walker Kehr, MD

## 2021-05-16 NOTE — Assessment & Plan Note (Signed)
Diet/salt restriction controlled

## 2021-05-16 NOTE — Assessment & Plan Note (Signed)
On testosterone 

## 2021-05-16 NOTE — Assessment & Plan Note (Signed)
30 h awake - 15 h sleep cycle

## 2021-05-16 NOTE — Addendum Note (Signed)
Addended by: Waldemar Dickens B on: 05/16/2021 09:17 AM   Modules accepted: Orders

## 2021-06-14 DIAGNOSIS — G4724 Circadian rhythm sleep disorder, free running type: Secondary | ICD-10-CM | POA: Diagnosis not present

## 2021-06-14 DIAGNOSIS — F341 Dysthymic disorder: Secondary | ICD-10-CM | POA: Diagnosis not present

## 2021-06-14 DIAGNOSIS — F900 Attention-deficit hyperactivity disorder, predominantly inattentive type: Secondary | ICD-10-CM | POA: Diagnosis not present

## 2021-06-19 ENCOUNTER — Encounter: Payer: Self-pay | Admitting: Internal Medicine

## 2021-06-21 ENCOUNTER — Encounter: Payer: Self-pay | Admitting: Internal Medicine

## 2021-06-21 ENCOUNTER — Other Ambulatory Visit (INDEPENDENT_AMBULATORY_CARE_PROVIDER_SITE_OTHER): Payer: BC Managed Care – PPO

## 2021-06-21 ENCOUNTER — Other Ambulatory Visit: Payer: Self-pay

## 2021-06-21 ENCOUNTER — Ambulatory Visit (INDEPENDENT_AMBULATORY_CARE_PROVIDER_SITE_OTHER): Payer: BC Managed Care – PPO | Admitting: Internal Medicine

## 2021-06-21 VITALS — BP 158/74 | HR 104 | Temp 99.8°F | Ht 70.0 in | Wt 275.0 lb

## 2021-06-21 DIAGNOSIS — R5383 Other fatigue: Secondary | ICD-10-CM

## 2021-06-21 DIAGNOSIS — G479 Sleep disorder, unspecified: Secondary | ICD-10-CM

## 2021-06-21 DIAGNOSIS — I10 Essential (primary) hypertension: Secondary | ICD-10-CM

## 2021-06-21 MED ORDER — MIRTAZAPINE 15 MG PO TABS: 15.0000 mg | ORAL_TABLET | Freq: Every day | ORAL | 2 refills | Status: DC

## 2021-06-21 NOTE — Progress Notes (Signed)
Patient ID: Herbert Bryant, male   DOB: [birth year], 40 y.o.   MRN: [id number]        Chief Complaint: follow up chronic insomnia       HPI:  Herbert Bryant is a 40 y.o. male here with c/o unable to sleep; has long hx of chronic insomnia and psychiatric do, admits to trying every sleep med on the market and many other psychiatric meds over the years, sees psychiatry but unable to access today; believes he cannot get through the weekend; has tried combination meds but most recently combination rozerem and seroquel did not help, nor did trazodone and xanax.  Has these meds currently;   recalls at one point he did do well with remeron at bedtime but after some time it stopped working, but willing to try again.  Pt denies chest pain, increased sob or doe, wheezing, orthopnea, PND, increased LE swelling, palpitations, dizziness or syncope.   Pt denies polydipsia, polyuria, or new focal nuro s/s.  Denies worsening depressive symptoms, suicidal ideation, or panic; has ongoing anxiety,      Does c/o ongoing fatigue, but denies signficant daytime hypersomnolence. Wt Readings from Last 3 Encounters:  06/21/21 275 lb (124.7 kg)  05/16/21 284 lb 6.4 oz (129 kg)  12/28/20 295 lb 9.6 oz (134.1 kg)   BP Readings from Last 3 Encounters:  06/21/21 (!) 158/74  05/16/21 (!) 148/80  12/28/20 (!) 150/80         Past Medical History:  Diagnosis Date   Acne    ADD (attention deficit disorder)    ADHD (attention deficit hyperactivity disorder)    Allergy    Anxiety    Bipolar 1 disorder (HCC)    Depression    GERD (gastroesophageal reflux disease)    Hypertension    IBS (irritable bowel syndrome)    Past Surgical History:  Procedure Laterality Date   MOUTH SURGERY  1993   skin graft   WISDOM TOOTH EXTRACTION      reports that he quit smoking about 8 years ago. His smoking use included cigarettes. He smoked an average of .5 packs per day. He has never used smokeless tobacco. He reports current alcohol  use. He reports current drug use. Drug: Marijuana. family history includes Alcohol abuse in his mother; Alzheimer's disease in his maternal grandfather and maternal grandmother; Arthritis in his maternal grandfather and maternal grandmother; Depression in his mother; Emphysema in his paternal grandfather; Hypertension in his maternal grandmother; Skin cancer in his father and mother; Stroke in his maternal grandmother. Allergies  Allergen Reactions   Geodon [Ziprasidone]    Losartan     Diarrhea, CP, fatigue   Current Outpatient Medications on File Prior to Visit  Medication Sig Dispense Refill   ALPRAZolam (XANAX) 0.5 MG tablet Take 0.25-0.5 mg by mouth 3 (three) times daily as needed. Anxiety      cetirizine (ZYRTEC) 10 MG tablet Take 10 mg by mouth daily.     Cholecalciferol (VITAMIN D3) 50 MCG (2000 UT) capsule Take 1 capsule (2,000 Units total) by mouth daily. 100 capsule 3   clobetasol ointment (TEMOVATE) 0.05 % Apply 1 application topically 2 (two) times daily. Apply topically twice a day     Cyanocobalamin (VITAMIN B-12) 500 MCG SUBL Place 1 tablet (500 mcg total) under the tongue 1 day or 1 dose. 100 tablet 3   finasteride (PROPECIA) 1 MG tablet ONE QUARTER TABLET DAILY 23 tablet 11   fish oil-omega-3 fatty acids 1000 MG capsule  Take 1 g by mouth daily.      L-Methylfolate-Algae (DEPLIN 7.5 PO) Take 1 capsule by mouth daily.     Lifitegrast (XIIDRA) 5 % SOLN Apply 1 drop to eye in the morning and at bedtime.     MINOXIDIL, TOPICAL, 5 % SOLN Apply topically 2 (two) times a day.     MIRVASO 0.33 % GEL Apply topically. Apply topically every day     Multiple Minerals-Vitamins (CITRACAL PLUS PO) Take by mouth daily.     omeprazole (PRILOSEC) 20 MG capsule Take 20 mg by mouth daily.     Tasimelteon 20 MG CAPS Take by mouth at bedtime.      Testosterone 20.25 MG/ACT (1.62%) GEL PLACE 3 PUMPS ONTO THE SKIN EVERY MORNING. 150 g 5   traZODone (DESYREL) 50 MG tablet Take 50-150 mg by mouth at  bedtime as needed for sleep.     liothyronine (CYTOMEL) 5 MCG tablet Take 1 tablet (5 mcg total) by mouth daily. (Patient not taking: Reported on 06/21/2021) 30 tablet 5   viloxazine ER (QELBREE) 200 MG 24 hr capsule Take 400 mg by mouth daily. (Patient not taking: Reported on 06/21/2021)     No current facility-administered medications on file prior to visit.        ROS:  All others reviewed and negative.  Objective        PE:  BP (!) 158/74 (BP Location: Left Arm, Patient Position: Sitting, Cuff Size: Large)   Pulse (!) 104   Temp 99.8 F (37.7 C) (Oral)   Ht 5\' 10"  (1.778 m)   Wt 275 lb (124.7 kg)   SpO2 98%   BMI 39.46 kg/m                 Constitutional: Pt appears in NAD               HENT: Head: NCAT.                Right Ear: External ear normal.                 Left Ear: External ear normal.                Eyes: . Pupils are equal, round, and reactive to light. Conjunctivae and EOM are normal               Nose: without d/c or deformity               Neck: Neck supple. Gross normal ROM               Cardiovascular: Normal rate and regular rhythm.                 Pulmonary/Chest: Effort normal and breath sounds without rales or wheezing.                               Neurological: Pt is alert. At baseline orientation, motor grossly intact               Skin: Skin is warm. No rashes, no other new lesions, LE edema - none               Psychiatric: Pt behavior is normal without agitation , nervous depressed affect  Micro: none  Cardiac tracings I have personally interpreted today:  none  Pertinent Radiological findings (summarize): none   Lab Results  Component  Value Date   WBC 8.9 05/16/2021   HGB 15.4 05/16/2021   HCT 46.3 05/16/2021   PLT 172.0 05/16/2021   GLUCOSE 84 05/16/2021   CHOL 193 05/16/2021   TRIG 190.0 (H) 05/16/2021   HDL 46.00 05/16/2021   LDLCALC 109 (H) 05/16/2021   ALT 28 05/16/2021   AST 19 05/16/2021   NA 138 05/16/2021   K 3.9 05/16/2021    CL 101 05/16/2021   CREATININE 1.13 05/16/2021   BUN 17 05/16/2021   CO2 27 05/16/2021   TSH 1.70 05/16/2021   PSA 0.57 05/16/2021   HGBA1C 5.4 01/13/2020   Assessment/Plan:  Herbert Bryant is a 40 y.o. White or Caucasian [1] White or Caucasian [1] male with  has a past medical history of Acne, ADD (attention deficit disorder), ADHD (attention deficit hyperactivity disorder), Allergy, Anxiety, Bipolar 1 disorder (HCC), Depression, GERD (gastroesophageal reflux disease), Hypertension, and IBS (irritable bowel syndrome).  Fatigue Etiology unclear, for TFTs with lab per pt request  Sleep disorder Chronic persistent, ok for trial xanax/seroquel 25/remeron 30 qhs,  to f/u any worsening symptoms or concerns    HTN (hypertension) BP Readings from Last 3 Encounters:  06/21/21 (!) 158/74  05/16/21 (!) 148/80  12/28/20 (!) 150/80   Chronic uncontrolled, declines med change, pt to continue medical treatment for now  Followup: No follow-ups on file.  Oliver Barre, MD 06/22/2021 8:21 PM [facility] - [facility]

## 2021-06-21 NOTE — Patient Instructions (Signed)
Please take all new medication as prescribed- the remeron  Ok to take with the xanax 0.5 and the seroquel 25 mg at bedtime as needed  Please continue all other medications as before, and refills have been done if requested.  Please have the pharmacy call with any other refills you may need.  Please keep your appointments with your specialists as you may have planned  Please go to the LAB at the blood drawing area for the tests to be done - at the [facility] lab  You will be contacted by phone if any changes need to be made immediately.  Otherwise, you will receive a letter about your results with an explanation, but please check with MyChart first.  Please remember to sign up for MyChart if you have not done so, as this will be important to you in the future with finding out test results, communicating by private email, and scheduling acute appointments online when needed.

## 2021-06-22 NOTE — Assessment & Plan Note (Signed)
BP Readings from Last 3 Encounters:  06/21/21 (!) 158/74  05/16/21 (!) 148/80  12/28/20 (!) 150/80   Chronic uncontrolled, declines med change, pt to continue medical treatment for now

## 2021-06-22 NOTE — Assessment & Plan Note (Signed)
Etiology unclear, for TFTs with lab per pt request

## 2021-06-22 NOTE — Assessment & Plan Note (Signed)
Chronic persistent, ok for trial xanax/seroquel 25/remeron 30 qhs,  to f/u any worsening symptoms or concerns

## 2021-06-24 ENCOUNTER — Encounter: Payer: Self-pay | Admitting: Internal Medicine

## 2021-06-24 LAB — TSH: TSH: 1.07 u[IU]/mL (ref 0.35–5.50)

## 2021-06-24 LAB — T3, FREE: T3, Free: 3.9 pg/mL (ref 2.3–4.2)

## 2021-06-24 LAB — T4, FREE: Free T4: 1.07 ng/dL (ref 0.60–1.60)

## 2021-06-25 ENCOUNTER — Telehealth: Payer: Self-pay | Admitting: Neurology

## 2021-06-25 ENCOUNTER — Encounter: Payer: Self-pay | Admitting: Internal Medicine

## 2021-06-25 NOTE — Telephone Encounter (Signed)
Pt called wanting some help. He has not slept more than 2 hours at night since thanksgiving.

## 2021-06-25 NOTE — Telephone Encounter (Signed)
Pt saw Dr. Jonny Ruiz on 06/21/21. Pls advise on email.Marland KitchenRaechel Chute

## 2021-06-25 NOTE — Telephone Encounter (Signed)
Please call patient back and let him know that it has been over 3 years since we have seen him. Our office policy is that he would need a new referral from his primary care provider. Please have him call his PCP to discuss and then they can refer back to our office.

## 2021-06-25 NOTE — Telephone Encounter (Signed)
Duplicate msg..,pt sent msg to Dr. Posey Rea. Waiting on his advisement.Marland KitchenRaechel Chute

## 2021-06-26 ENCOUNTER — Other Ambulatory Visit: Payer: Self-pay | Admitting: Internal Medicine

## 2021-06-26 DIAGNOSIS — G479 Sleep disorder, unspecified: Secondary | ICD-10-CM

## 2021-06-26 NOTE — Telephone Encounter (Signed)
Patient calling to check mychart response, patient requesting a call back  *see below*

## 2021-06-26 NOTE — Progress Notes (Signed)
Subjective:    Patient ID: Herbert Bryant, male    DOB: 29-Dec-1980, 40 y.o.   MRN: [id number]  This visit occurred during the SARS-CoV-2 public health emergency.  Safety protocols were in place, including screening questions prior to the visit, additional usage of staff PPE, and extensive cleaning of exam room while observing appropriate contact time as indicated for disinfecting solutions.    HPI The patient is here for an acute visit.   Sleep disorder - The day after thanksgiving he has not slept more than 1-3 hours a day.  None of his sleep medications are working.  He has seen Neurology in the past and has a referral in again to see neuro.  He follows with Dr Evelene Croon.    In past could do remeron and Seroquel or Trazodone and xanax and it would work -- nothing is working now.    Has been on every medication for sleep.    Has tried phenergan years ago. This is the only thing he can think of possibly trying again.     Medications and allergies reviewed with patient and updated if appropriate.  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 Anxiety      cetirizine (ZYRTEC) 10 MG tablet Take 10 mg by mouth daily.      clobetasol ointment (TEMOVATE) 0.05 % Apply 1 application topically 2 (two) times daily. Apply topically twice a day     Cyanocobalamin (VITAMIN B-12) 500 MCG SUBL Place 1 tablet (500 mcg total) under the tongue 1 day or 1 dose. 100 tablet 3   finasteride (PROPECIA) 1 MG tablet ONE QUARTER TABLET DAILY 23 tablet 11   fish oil-omega-3 fatty acids 1000 MG capsule Take 1 g by mouth daily.      L-Methylfolate-Algae (DEPLIN 7.5 PO) Take 1 capsule by mouth daily.     Lifitegrast (XIIDRA) 5 % SOLN Apply 1 drop to eye in the morning and at bedtime.     liothyronine (CYTOMEL) 5 MCG tablet Take 1 tablet (5 mcg total) by mouth daily. 30 tablet 5   MINOXIDIL, TOPICAL, 5 % SOLN Apply topically 2 (two) times a day.     mirtazapine (REMERON) 15 MG tablet Take 1 tablet (15 mg total) by mouth at bedtime. 30 tablet 2   MIRVASO 0.33 % GEL Apply topically. Apply topically every day     Multiple Minerals-Vitamins (CITRACAL PLUS PO) Take by mouth daily.     omeprazole (PRILOSEC) 20 MG capsule Take 20 mg by mouth daily.     Tasimelteon 20 MG CAPS Take by mouth at bedtime.      Testosterone 20.25 MG/ACT (1.62%) GEL PLACE 3 PUMPS ONTO THE SKIN EVERY MORNING. 150 g 5   traZODone (  DESYREL) 50 MG tablet Take 50-150 mg by mouth at bedtime as needed for sleep.     viloxazine ER (QELBREE) 200 MG 24 hr capsule Take 400 mg by mouth daily.     Cholecalciferol (VITAMIN D3) 50 MCG (2000 UT) capsule Take 1 capsule (2,000 Units total) by mouth daily. (Patient not taking: Reported on 06/27/2021) 100 capsule 3   No current facility-administered medications on file prior to visit.    Past Medical History:  Diagnosis Date   Acne    ADD (attention deficit disorder)    ADHD (attention deficit hyperactivity disorder)    Allergy    Anxiety    Bipolar 1 disorder (HCC)    Depression    GERD (gastroesophageal reflux disease)    Hypertension    IBS (irritable bowel syndrome)     Past Surgical History:  Procedure Laterality Date    MOUTH SURGERY  1993   skin graft   WISDOM TOOTH EXTRACTION      Social History   Socioeconomic History   Marital status: Single    Spouse name: Not on file   Number of children: 0   Years of education: Not on file   Highest education level: Bachelor's degree (e.g., BA, AB, BS)  Occupational History   Not on file  Tobacco Use   Smoking status: Former    Packs/day: 0.50    Types: Cigarettes    Quit date: 06/25/2012    Years since quitting: 9.0   Smokeless tobacco: Never  Vaping Use   Vaping Use: Never used  Substance and Sexual Activity   Alcohol use: Yes    Alcohol/week: 0.0 standard drinks    Comment: occasional   Drug use: Yes    Types: Marijuana    Comment: use on daily basis   Sexual activity: Yes    Birth control/protection: None  Other Topics Concern   Not on file  Social History Narrative   Lives at home alone.  He is unemployed.  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     Social Determinants of Health   Financial Resource Strain: Not on file  Food Insecurity: Not on file  Transportation Needs: Not on file  Physical Activity: Not on file  Stress: Not on file  Social Connections: Not on file    Family History  Problem Relation Age of Onset   Arthritis Maternal Grandmother    Stroke Maternal Grandmother    Hypertension Maternal Grandmother    Alzheimer's disease Maternal Grandmother    Arthritis Maternal Grandfather    Alzheimer's disease Maternal Grandfather    Emphysema Paternal Grandfather    Alcohol abuse Mother    Depression Mother    Skin cancer Mother    Skin cancer Father     Review of Systems  Constitutional:  Negative for fever.  Respiratory:  Negative for shortness of breath.   Cardiovascular:  Positive for chest pain (occ). Negative for palpitations and leg swelling.  Neurological:  Positive for headaches. Negative for dizziness.      Objective:   Vitals:   06/27/21 1039  BP: (!) 160/80  Pulse: 96  Temp: 98.4 F (36.9 C)   SpO2: 97%   BP Readings from Last 3 Encounters:  06/27/21 (!) 160/80  06/21/21 (!) 158/74  05/16/21 (!) 148/80   Wt Readings from Last 3 Encounters:  06/27/21 274 lb (124.3 kg)  06/21/21 275 lb (124.7 kg)  05/16/21 284 lb 6.4 oz (129 kg)   Body mass index is 39.31 kg/m.  Physical Exam    Constitutional: Appears well-developed and well-nourished. No distress.  Head: Normocephalic and atraumatic.  Cardiovascular: Normal rate, regular rhythm and normal heart sounds.  No murmur heard. No carotid bruit .  No edema Pulmonary/Chest: Effort normal and breath sounds normal. No respiratory distress. No has no wheezes. No rales.  Skin: Skin is warm and dry. Not diaphoretic.  Psychiatric: Normal mood and affect. Behavior is normal.       Assessment & Plan:    Hypertension: Chronic Agrees to start medication Start amlodipine 5 mg daily  Sleep d/o: Chronic Severe in nature Has tried everything and for 3 weeks nothing is working Will try phenergan 25-50 mg HS  -- likely will need 50 mg He will also consult Dr Evelene Croon what else he can take with it Referral to neuro has been ordered - he will call there to set up appointment.

## 2021-06-27 ENCOUNTER — Encounter: Payer: Self-pay | Admitting: Internal Medicine

## 2021-06-27 ENCOUNTER — Other Ambulatory Visit: Payer: Self-pay | Admitting: Internal Medicine

## 2021-06-27 ENCOUNTER — Other Ambulatory Visit: Payer: Self-pay

## 2021-06-27 ENCOUNTER — Ambulatory Visit (INDEPENDENT_AMBULATORY_CARE_PROVIDER_SITE_OTHER): Payer: BC Managed Care – PPO | Admitting: Internal Medicine

## 2021-06-27 VITALS — BP 160/80 | HR 96 | Temp 98.4°F | Ht 70.0 in | Wt 274.0 lb

## 2021-06-27 DIAGNOSIS — G479 Sleep disorder, unspecified: Secondary | ICD-10-CM

## 2021-06-27 DIAGNOSIS — I10 Essential (primary) hypertension: Secondary | ICD-10-CM

## 2021-06-27 DIAGNOSIS — L649 Androgenic alopecia, unspecified: Secondary | ICD-10-CM

## 2021-06-27 MED ORDER — AMLODIPINE BESYLATE 5 MG PO TABS: 5.0000 mg | ORAL_TABLET | Freq: Every day | ORAL | 5 refills | Status: DC

## 2021-06-27 MED ORDER — PROMETHAZINE HCL 25 MG PO TABS: 25.0000 mg | ORAL_TABLET | Freq: Every day | ORAL | 0 refills | Status: AC

## 2021-06-27 NOTE — Patient Instructions (Addendum)
° ° ° °  Medications changes include :   amlodipine 5 mg daily for BP and phenergan for sleep.  Your prescription(s) have been submitted to your pharmacy. Please take as directed and contact our office if you believe you are having problem(s) with the medication(s).

## 2021-07-13 ENCOUNTER — Other Ambulatory Visit: Payer: Self-pay | Admitting: Internal Medicine

## 2021-07-17 ENCOUNTER — Telehealth: Payer: Self-pay | Admitting: Neurology

## 2021-07-17 NOTE — Telephone Encounter (Signed)
I called pt and relayed that per Dr. Frances Furbish she did not have anything else to offer him.  She thought him best managed by sleep psychology.   He may get referral to another sleep specialist ([facility]) that may have more to offer , and this referral from his pcp or psychiatrist.  Cognitive behavioral therapy was option.  I did not relay that to pt has did not see her not until after I had spoken to him.

## 2021-07-17 NOTE — Telephone Encounter (Signed)
Thank you for the updates. I really don't have anything else to offer him.  I think he is best managed with psychiatry and he can ask for a sleep psychology referral to consider cognitive behavioral therapy.  He can talk to his psychiatrist or primary care about a referral to psychology for this.  I would recommend we forego his appointment with me.  He can also talk to his primary care physician about a referral to a different sleep specialist.  Unfortunately, there is not much else I can offer.

## 2021-07-17 NOTE — Telephone Encounter (Signed)
Spoke to pt.  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 ( Day or night does not rest).  He is on Hetloz prescribed by psychiatry.  He is looking for guidance.  Has had 3 sleep study's. (Dr Teofilo Pod pt did not have OSA).  He had episode last 05/2021  that caused him not to sleep only 1-3 hours / day/ month.  His HR/BP elevated.  He states that needs psychiatry and sleep neurologist to work as a team to manage it.  He is not asking for medications to help him sleep.  I relayed will let her know and get back with him.  I relayed to him that she would be happy to assist with any organic issue with sleep (and get another sleep study).

## 2021-07-17 NOTE — Telephone Encounter (Signed)
Please reach out to patient, he is on the schedule as a sleep referral from his primary care physician.  If there is concern for chronic insomnia, please advise patient that I will not be able to treat him for chronic insomnia with medications.  This is best addressed through primary care or psychiatry or psychology.  He had a home sleep test some 3 years ago. He was originally referred to me by Dr. Marjory Lies.  If he is agreeable, we can certainly try to repeat a sleep study if insurance covers it, to rule out an underlying cause for his difficulty sleeping such as sleep apnea but previous home sleep test was negative for sleep apnea.  If he is willing to pursue another sleep study to rule out an organic cause for his sleep disturbance, I am happy to see him again. However, I am not going to be able to address chronic insomnia with any prescription medications.  I just wanted to clarify before he comes in to the clinic.   For any neurological concerns he can follow-up with Dr. Marjory Lies.

## 2021-07-17 NOTE — Telephone Encounter (Signed)
LMVm for pt to return call regarding appt that is scheduled for tomorrow.

## 2021-07-18 ENCOUNTER — Institutional Professional Consult (permissible substitution): Payer: Self-pay | Admitting: Neurology

## 2021-07-26 DIAGNOSIS — M791 Myalgia, unspecified site: Secondary | ICD-10-CM | POA: Diagnosis not present

## 2021-07-26 DIAGNOSIS — M5414 Radiculopathy, thoracic region: Secondary | ICD-10-CM | POA: Diagnosis not present

## 2021-07-26 DIAGNOSIS — M9902 Segmental and somatic dysfunction of thoracic region: Secondary | ICD-10-CM | POA: Diagnosis not present

## 2021-07-27 DIAGNOSIS — G4724 Circadian rhythm sleep disorder, free running type: Secondary | ICD-10-CM | POA: Diagnosis not present

## 2021-07-27 DIAGNOSIS — F331 Major depressive disorder, recurrent, moderate: Secondary | ICD-10-CM | POA: Diagnosis not present

## 2021-07-27 DIAGNOSIS — F900 Attention-deficit hyperactivity disorder, predominantly inattentive type: Secondary | ICD-10-CM | POA: Diagnosis not present

## 2021-07-30 DIAGNOSIS — M791 Myalgia, unspecified site: Secondary | ICD-10-CM | POA: Diagnosis not present

## 2021-07-30 DIAGNOSIS — M5414 Radiculopathy, thoracic region: Secondary | ICD-10-CM | POA: Diagnosis not present

## 2021-07-30 DIAGNOSIS — M9902 Segmental and somatic dysfunction of thoracic region: Secondary | ICD-10-CM | POA: Diagnosis not present

## 2021-08-06 DIAGNOSIS — L817 Pigmented purpuric dermatosis: Secondary | ICD-10-CM | POA: Diagnosis not present

## 2021-08-06 DIAGNOSIS — Z411 Encounter for cosmetic surgery: Secondary | ICD-10-CM | POA: Diagnosis not present

## 2021-08-06 DIAGNOSIS — L810 Postinflammatory hyperpigmentation: Secondary | ICD-10-CM | POA: Diagnosis not present

## 2021-08-06 DIAGNOSIS — L820 Inflamed seborrheic keratosis: Secondary | ICD-10-CM | POA: Diagnosis not present

## 2021-08-06 DIAGNOSIS — L301 Dyshidrosis [pompholyx]: Secondary | ICD-10-CM | POA: Diagnosis not present

## 2021-08-09 DIAGNOSIS — M791 Myalgia, unspecified site: Secondary | ICD-10-CM | POA: Diagnosis not present

## 2021-08-09 DIAGNOSIS — M5414 Radiculopathy, thoracic region: Secondary | ICD-10-CM | POA: Diagnosis not present

## 2021-08-09 DIAGNOSIS — M9902 Segmental and somatic dysfunction of thoracic region: Secondary | ICD-10-CM | POA: Diagnosis not present

## 2021-08-16 DIAGNOSIS — M9902 Segmental and somatic dysfunction of thoracic region: Secondary | ICD-10-CM | POA: Diagnosis not present

## 2021-08-16 DIAGNOSIS — M791 Myalgia, unspecified site: Secondary | ICD-10-CM | POA: Diagnosis not present

## 2021-08-16 DIAGNOSIS — M5414 Radiculopathy, thoracic region: Secondary | ICD-10-CM | POA: Diagnosis not present

## 2021-08-21 ENCOUNTER — Institutional Professional Consult (permissible substitution): Payer: Self-pay | Admitting: Neurology

## 2021-08-27 DIAGNOSIS — M5414 Radiculopathy, thoracic region: Secondary | ICD-10-CM | POA: Diagnosis not present

## 2021-08-27 DIAGNOSIS — F900 Attention-deficit hyperactivity disorder, predominantly inattentive type: Secondary | ICD-10-CM | POA: Diagnosis not present

## 2021-08-27 DIAGNOSIS — M791 Myalgia, unspecified site: Secondary | ICD-10-CM | POA: Diagnosis not present

## 2021-08-27 DIAGNOSIS — G4724 Circadian rhythm sleep disorder, free running type: Secondary | ICD-10-CM | POA: Diagnosis not present

## 2021-08-27 DIAGNOSIS — M9902 Segmental and somatic dysfunction of thoracic region: Secondary | ICD-10-CM | POA: Diagnosis not present

## 2021-08-27 DIAGNOSIS — F331 Major depressive disorder, recurrent, moderate: Secondary | ICD-10-CM | POA: Diagnosis not present

## 2021-09-06 DIAGNOSIS — M5414 Radiculopathy, thoracic region: Secondary | ICD-10-CM | POA: Diagnosis not present

## 2021-09-06 DIAGNOSIS — M9902 Segmental and somatic dysfunction of thoracic region: Secondary | ICD-10-CM | POA: Diagnosis not present

## 2021-09-06 DIAGNOSIS — M791 Myalgia, unspecified site: Secondary | ICD-10-CM | POA: Diagnosis not present

## 2021-09-10 DIAGNOSIS — M791 Myalgia, unspecified site: Secondary | ICD-10-CM | POA: Diagnosis not present

## 2021-09-10 DIAGNOSIS — M9902 Segmental and somatic dysfunction of thoracic region: Secondary | ICD-10-CM | POA: Diagnosis not present

## 2021-09-10 DIAGNOSIS — M5414 Radiculopathy, thoracic region: Secondary | ICD-10-CM | POA: Diagnosis not present

## 2021-09-13 DIAGNOSIS — M5414 Radiculopathy, thoracic region: Secondary | ICD-10-CM | POA: Diagnosis not present

## 2021-09-13 DIAGNOSIS — M9902 Segmental and somatic dysfunction of thoracic region: Secondary | ICD-10-CM | POA: Diagnosis not present

## 2021-09-13 DIAGNOSIS — M791 Myalgia, unspecified site: Secondary | ICD-10-CM | POA: Diagnosis not present

## 2021-10-08 DIAGNOSIS — M791 Myalgia, unspecified site: Secondary | ICD-10-CM | POA: Diagnosis not present

## 2021-10-08 DIAGNOSIS — M9902 Segmental and somatic dysfunction of thoracic region: Secondary | ICD-10-CM | POA: Diagnosis not present

## 2021-10-08 DIAGNOSIS — M5414 Radiculopathy, thoracic region: Secondary | ICD-10-CM | POA: Diagnosis not present

## 2021-11-05 DIAGNOSIS — G4724 Circadian rhythm sleep disorder, free running type: Secondary | ICD-10-CM | POA: Diagnosis not present

## 2021-11-05 DIAGNOSIS — F900 Attention-deficit hyperactivity disorder, predominantly inattentive type: Secondary | ICD-10-CM | POA: Diagnosis not present

## 2021-11-05 DIAGNOSIS — F331 Major depressive disorder, recurrent, moderate: Secondary | ICD-10-CM | POA: Diagnosis not present

## 2021-12-23 ENCOUNTER — Other Ambulatory Visit: Payer: Self-pay | Admitting: Internal Medicine

## 2022-04-08 DIAGNOSIS — Z0271 Encounter for disability determination: Secondary | ICD-10-CM

## 2022-05-21 DIAGNOSIS — L719 Rosacea, unspecified: Secondary | ICD-10-CM | POA: Diagnosis not present

## 2022-05-21 DIAGNOSIS — L309 Dermatitis, unspecified: Secondary | ICD-10-CM | POA: Diagnosis not present

## 2022-07-12 ENCOUNTER — Other Ambulatory Visit: Payer: Self-pay | Admitting: Internal Medicine

## 2022-07-12 DIAGNOSIS — L649 Androgenic alopecia, unspecified: Secondary | ICD-10-CM

## 2022-08-05 ENCOUNTER — Encounter: Payer: Self-pay | Admitting: Internal Medicine

## 2022-08-06 ENCOUNTER — Telehealth (INDEPENDENT_AMBULATORY_CARE_PROVIDER_SITE_OTHER): Payer: BC Managed Care – PPO | Admitting: Internal Medicine

## 2022-08-06 ENCOUNTER — Encounter: Payer: Self-pay | Admitting: Internal Medicine

## 2022-08-06 DIAGNOSIS — I10 Essential (primary) hypertension: Secondary | ICD-10-CM | POA: Diagnosis not present

## 2022-08-06 DIAGNOSIS — U071 COVID-19: Secondary | ICD-10-CM

## 2022-08-06 DIAGNOSIS — J309 Allergic rhinitis, unspecified: Secondary | ICD-10-CM | POA: Diagnosis not present

## 2022-08-06 MED ORDER — HYDROCODONE BIT-HOMATROP MBR 5-1.5 MG/5ML PO SOLN: 5.0000 mL | Freq: Four times a day (QID) | ORAL | 0 refills | Status: DC | PRN

## 2022-08-06 MED ORDER — NIRMATRELVIR/RITONAVIR (PAXLOVID)TABLET: 3.0000 | ORAL_TABLET | Freq: Two times a day (BID) | ORAL | 0 refills | Status: AC

## 2022-08-06 NOTE — Assessment & Plan Note (Signed)
Stable overall, cont zyrtec 10 qd

## 2022-08-06 NOTE — Assessment & Plan Note (Signed)
Mild to mod, higher risk due to multiple comorbids; for paxlovid course, and cough med prn,  to f/u any worsening symptoms or concerns

## 2022-08-06 NOTE — Assessment & Plan Note (Signed)
BP Readings from Last 3 Encounters:  06/27/21 (!) 160/80  06/21/21 (!) 158/74  05/16/21 (!) 148/80   Uncontrolled, pt encouraged to f/u BP at home and next visit, continue clonidine

## 2022-08-06 NOTE — Patient Instructions (Signed)
Please take all new medication as prescribed 

## 2022-08-06 NOTE — Progress Notes (Signed)
Patient ID: Herbert Bryant, male   DOB: May 13, 1981, 42 y.o.   MRN: [id number]  Virtual Visit via Video Note  I connected with Lottie Mussel Sweet on 08/06/22 at  1:20 PM EST by a video enabled telemedicine application and verified that I am speaking with the correct person using two identifiers.  Location of all participnats today Patient: at home Provider: at office   I discussed the limitations of evaluation and management by telemedicine and the availability of in person appointments. The patient expressed understanding and agreed to proceed.  History of Present Illness: Here with 2 days onset symptoms and testing + for covid at home, after known exposure 3 days ago; now with feverish, fatigue, sinus congestion, frequent cough but no ST, chills, sob, n/v/d.  Pt denies chest pain, increased sob or doe, wheezing, orthopnea, PND, increased LE swelling, palpitations, dizziness or syncope.   Pt denies polydipsia, polyuria, or new focal neuro s/s.    Past Medical History:  Diagnosis Date   Acne    ADD (attention deficit disorder)    ADHD (attention deficit hyperactivity disorder)    Allergy    Anxiety    Bipolar 1 disorder (HCC)    Depression    GERD (gastroesophageal reflux disease)    Hypertension    IBS (irritable bowel syndrome)    Past Surgical History:  Procedure Laterality Date   MOUTH SURGERY  1993   skin graft   WISDOM TOOTH EXTRACTION      reports that he quit smoking about 10 years ago. His smoking use included cigarettes. He smoked an average of .5 packs per day. He has never used smokeless tobacco. He reports current alcohol use. He reports current drug use. Drug: Marijuana. family history includes Alcohol abuse in his mother; Alzheimer's disease in his maternal grandfather and maternal grandmother; Arthritis in his maternal grandfather and maternal grandmother; Depression in his mother; Emphysema in his paternal grandfather; Hypertension in his maternal grandmother; Skin  cancer in his father and mother; Stroke in his maternal grandmother. Allergies  Allergen Reactions   Geodon [Ziprasidone]    Losartan     Diarrhea, CP, fatigue   Current Outpatient Medications on File Prior to Visit  Medication Sig Dispense Refill   ALPRAZolam (XANAX) 0.5 MG tablet Take 0.25-0.5 mg by mouth 3 (three) times daily as needed. Anxiety      amLODipine (NORVASC) 5 MG tablet TAKE 1 TABLET (5 MG TOTAL) BY MOUTH DAILY. 90 tablet 1   cetirizine (ZYRTEC) 10 MG tablet Take 10 mg by mouth daily.     Cholecalciferol (VITAMIN D3) 50 MCG (2000 UT) capsule Take 1 capsule (2,000 Units total) by mouth daily. (Patient not taking: Reported on 06/27/2021) 100 capsule 3   clobetasol ointment (TEMOVATE) 8.41 % Apply 1 application topically 2 (two) times daily. Apply topically twice a day     Cyanocobalamin (VITAMIN B-12) 500 MCG SUBL Place 1 tablet (500 mcg total) under the tongue 1 day or 1 dose. 100 tablet 3   finasteride (PROPECIA) 1 MG tablet ONE QUARTER TABLET DAILY 23 tablet 5   fish oil-omega-3 fatty acids 1000 MG capsule Take 1 g by mouth daily.      L-Methylfolate-Algae (DEPLIN 7.5 PO) Take 1 capsule by mouth daily.     Lifitegrast (XIIDRA) 5 % SOLN Apply 1 drop to eye in the morning and at bedtime.     liothyronine (CYTOMEL) 5 MCG tablet Take 1 tablet (5 mcg total) by mouth daily. 30 tablet 5  MINOXIDIL, TOPICAL, 5 % SOLN Apply topically 2 (two) times a day.     mirtazapine (REMERON) 15 MG tablet Take 1 tablet (15 mg total) by mouth at bedtime. 30 tablet 2   MIRVASO 0.33 % GEL Apply topically. Apply topically every day     Multiple Minerals-Vitamins (CITRACAL PLUS PO) Take by mouth daily.     omeprazole (PRILOSEC) 20 MG capsule Take 20 mg by mouth daily.     promethazine (PHENERGAN) 25 MG tablet Take 1-2 tablets (25-50 mg total) by mouth at bedtime. 30 tablet 0   Tasimelteon 20 MG CAPS Take by mouth at bedtime.      Testosterone 20.25 MG/ACT (1.62%) GEL PLACE 3 PUMPS ONTO THE SKIN  EVERY MORNING. 150 g 5   traZODone (DESYREL) 50 MG tablet Take 50-150 mg by mouth at bedtime as needed for sleep.     viloxazine ER (QELBREE) 200 MG 24 hr capsule Take 400 mg by mouth daily.     No current facility-administered medications on file prior to visit.   Observations/Objective: Alert, NAD, appropriate mood and affect, resps normal, cn 2-12 intact, moves all 4s, no visible rash or swelling Lab Results  Component Value Date   WBC 8.9 05/16/2021   HGB 15.4 05/16/2021   HCT 46.3 05/16/2021   PLT 172.0 05/16/2021   GLUCOSE 84 05/16/2021   CHOL 193 05/16/2021   TRIG 190.0 (H) 05/16/2021   HDL 46.00 05/16/2021   LDLCALC 109 (H) 05/16/2021   ALT 28 05/16/2021   AST 19 05/16/2021   NA 138 05/16/2021   K 3.9 05/16/2021   CL 101 05/16/2021   CREATININE 1.13 05/16/2021   BUN 17 05/16/2021   CO2 27 05/16/2021   TSH 1.07 06/21/2021   PSA 0.57 05/16/2021   HGBA1C 5.4 01/13/2020   Assessment and Plan: See notes  Follow Up Instructions: See notes   I discussed the assessment and treatment plan with the patient. The patient was provided an opportunity to ask questions and all were answered. The patient agreed with the plan and demonstrated an understanding of the instructions.   The patient was advised to call back or seek an in-person evaluation if the symptoms worsen or if the condition fails to improve as anticipated.  Cathlean Cower, MD

## 2022-08-08 ENCOUNTER — Telehealth: Payer: Self-pay | Admitting: Internal Medicine

## 2022-08-08 NOTE — Telephone Encounter (Signed)
Patient is covid positive and being treated - his testosterone medication will be out by tomorrow - he normally has blood work done prior to new rx.  Patient wants to know what he should do as he will be out of his medication on Sunday.  Please advise.  Patients #  [phone]

## 2022-08-08 NOTE — Telephone Encounter (Signed)
Call Pt  he is overdue for appt w/ [facility]. No labs since 2022. Pt must make cpx appt w/ provider.Marland KitchenJohny Chess

## 2022-08-11 ENCOUNTER — Other Ambulatory Visit: Payer: Self-pay | Admitting: Internal Medicine

## 2022-08-11 DIAGNOSIS — E291 Testicular hypofunction: Secondary | ICD-10-CM

## 2022-08-14 ENCOUNTER — Encounter: Payer: Self-pay | Admitting: Internal Medicine

## 2022-08-14 ENCOUNTER — Ambulatory Visit (INDEPENDENT_AMBULATORY_CARE_PROVIDER_SITE_OTHER): Payer: BC Managed Care – PPO | Admitting: Internal Medicine

## 2022-08-14 VITALS — BP 126/82 | HR 97 | Temp 98.6°F | Ht 70.0 in | Wt 252.0 lb

## 2022-08-14 DIAGNOSIS — Z8616 Personal history of COVID-19: Secondary | ICD-10-CM | POA: Diagnosis not present

## 2022-08-14 DIAGNOSIS — I10 Essential (primary) hypertension: Secondary | ICD-10-CM

## 2022-08-14 DIAGNOSIS — E291 Testicular hypofunction: Secondary | ICD-10-CM | POA: Diagnosis not present

## 2022-08-14 DIAGNOSIS — R5383 Other fatigue: Secondary | ICD-10-CM

## 2022-08-14 MED ORDER — TESTOSTERONE 20.25 MG/ACT (1.62%) TD GEL: TRANSDERMAL | 5 refills | Status: DC

## 2022-08-14 NOTE — Progress Notes (Signed)
Subjective:  Patient ID: Herbert Bryant, male    DOB: 12/29/1980  Age: 42 y.o. MRN: [id number]  CC: Medication Refill   HPI Herbert Bryant presents for hypogonadism, depression, sleep disorder  Pt lost wt on diet  Outpatient Medications Prior to Visit  Medication Sig Dispense Refill   ALPRAZolam (XANAX) 0.5 MG tablet Take 0.25-0.5 mg by mouth 3 (three) times daily as needed. Anxiety      amphetamine-dextroamphetamine (ADDERALL) 20 MG tablet Take 20 mg by mouth 3 (three) times daily.     ARIPiprazole (ABILIFY) 2 MG tablet Take 2 mg by mouth daily.     Brimonidine Tartrate (MIRVASO) 0.33 % GEL APPLY DAILY TOPICALLY Externally Once a day for 30 days     cetirizine (ZYRTEC) 10 MG tablet Take 10 mg by mouth daily.     Cholecalciferol (VITAMIN D3) 50 MCG (2000 UT) capsule Take 1 capsule (2,000 Units total) by mouth daily. 100 capsule 3   clobetasol ointment (TEMOVATE) 0.09 % Apply 1 application topically 2 (two) times daily. Apply topically twice a day     cloNIDine HCl (KAPVAY) 0.1 MG TB12 ER tablet Take by mouth.     Cyanocobalamin (VITAMIN B-12) 500 MCG SUBL Place 1 tablet (500 mcg total) under the tongue 1 day or 1 dose. 100 tablet 3   finasteride (PROPECIA) 1 MG tablet ONE QUARTER TABLET DAILY 23 tablet 5   fish oil-omega-3 fatty acids 1000 MG capsule Take 1 g by mouth daily.      [place] 300 MG CAPS as directed Orally     hydroquinone 4 % cream as directed Externally bid, 2 months on then 1 month break for 60 days     L-Methylfolate-Algae (DEPLIN 7.5 PO) Take 1 capsule by mouth daily.     MINOXIDIL, TOPICAL, 5 % SOLN Apply topically 2 (two) times a day.     Multiple Minerals-Vitamins (CITRACAL PLUS PO) Take by mouth daily.     omeprazole (PRILOSEC) 20 MG capsule Take 20 mg by mouth daily.     Tasimelteon 20 MG CAPS Take by mouth at bedtime.      traZODone (DESYREL) 50 MG tablet Take 50-150 mg by mouth at bedtime as needed for sleep.     tretinoin (RETIN-A) 3.818 % cream 1  application to affected area in the evening to face Externally Once a day for 30 Days     triamcinolone (NASACORT ALLERGY 24HR) 55 MCG/ACT AERO nasal inhaler 1 puff in each nostril Nasally Once a day     Testosterone 20.25 MG/ACT (1.62%) GEL PLACE 3 PUMPS ONTO THE SKIN EVERY MORNING. 150 g 5   Lifitegrast (XIIDRA) 5 % SOLN Apply 1 drop to eye in the morning and at bedtime. (Patient not taking: Reported on 08/14/2022)     liothyronine (CYTOMEL) 5 MCG tablet Take 1 tablet (5 mcg total) by mouth daily. (Patient not taking: Reported on 08/14/2022) 30 tablet 5   promethazine (PHENERGAN) 25 MG tablet Take 1-2 tablets (25-50 mg total) by mouth at bedtime. (Patient not taking: Reported on 08/14/2022) 30 tablet 0   viloxazine ER (QELBREE) 200 MG 24 hr capsule Take 400 mg by mouth daily. (Patient not taking: Reported on 08/14/2022)     amLODipine (NORVASC) 5 MG tablet TAKE 1 TABLET (5 MG TOTAL) BY MOUTH DAILY. (Patient not taking: Reported on 08/14/2022) 90 tablet 1   HYDROcodone bit-homatropine (HYCODAN) 5-1.5 MG/5ML syrup Take 5 mLs by mouth every 6 (six) hours as needed for up to  10 days. (Patient not taking: Reported on 08/14/2022) 180 mL 0   mirtazapine (REMERON) 15 MG tablet Take 1 tablet (15 mg total) by mouth at bedtime. 30 tablet 2   MIRVASO 0.33 % GEL Apply topically. Apply topically every day     No facility-administered medications prior to visit.    ROS: Review of Systems  Constitutional:  Positive for fatigue and unexpected weight change. Negative for appetite change.  HENT:  Negative for congestion, nosebleeds, sneezing, sore throat and trouble swallowing.   Eyes:  Negative for itching and visual disturbance.  Respiratory:  Negative for cough.   Cardiovascular:  Negative for chest pain, palpitations and leg swelling.  Gastrointestinal:  Negative for abdominal distention, blood in stool, diarrhea and nausea.  Genitourinary:  Negative for frequency and hematuria.  Musculoskeletal:  Negative for back  pain, gait problem, joint swelling and neck pain.  Skin:  Negative for rash.  Neurological:  Negative for dizziness, tremors, speech difficulty and weakness.  Psychiatric/Behavioral:  Positive for decreased concentration, dysphoric mood and sleep disturbance. Negative for agitation and suicidal ideas. The patient is nervous/anxious.     Objective:  BP 126/82 (BP Location: Left Arm, Patient Position: Sitting, Cuff Size: Normal)   Pulse 97   Temp 98.6 F (37 C) (Oral)   Ht 5\' 10"  (1.778 m)   Wt 252 lb (114.3 kg)   SpO2 96%   BMI 36.16 kg/m   BP Readings from Last 3 Encounters:  08/14/22 126/82  06/27/21 (!) 160/80  06/21/21 (!) 158/74    Wt Readings from Last 3 Encounters:  08/14/22 252 lb (114.3 kg)  06/27/21 274 lb (124.3 kg)  06/21/21 275 lb (124.7 kg)    Physical Exam Constitutional:      General: He is not in acute distress.    Appearance: He is well-developed. He is obese.     Comments: NAD  Eyes:     Conjunctiva/sclera: Conjunctivae normal.     Pupils: Pupils are equal, round, and reactive to light.  Neck:     Thyroid: No thyromegaly.     Vascular: No JVD.  Cardiovascular:     Rate and Rhythm: Normal rate and regular rhythm.     Heart sounds: Normal heart sounds. No murmur heard.    No friction rub. No gallop.  Pulmonary:     Effort: Pulmonary effort is normal. No respiratory distress.     Breath sounds: Normal breath sounds. No wheezing or rales.  Chest:     Chest wall: No tenderness.  Abdominal:     General: Bowel sounds are normal. There is no distension.     Palpations: Abdomen is soft. There is no mass.     Tenderness: There is no abdominal tenderness. There is no guarding or rebound.  Musculoskeletal:        General: No tenderness. Normal range of motion.     Cervical back: Normal range of motion.  Lymphadenopathy:     Cervical: No cervical adenopathy.  Skin:    General: Skin is warm and dry.     Findings: No rash.  Neurological:     Mental  Status: He is alert and oriented to person, place, and time.     Cranial Nerves: No cranial nerve deficit.     Motor: No abnormal muscle tone.     Coordination: Coordination normal.     Gait: Gait normal.     Deep Tendon Reflexes: Reflexes are normal and symmetric.  Psychiatric:  Behavior: Behavior normal.        Thought Content: Thought content normal.        Judgment: Judgment normal.     Lab Results  Component Value Date   WBC 8.9 05/16/2021   HGB 15.4 05/16/2021   HCT 46.3 05/16/2021   PLT 172.0 05/16/2021   GLUCOSE 84 05/16/2021   CHOL 193 05/16/2021   TRIG 190.0 (H) 05/16/2021   HDL 46.00 05/16/2021   LDLCALC 109 (H) 05/16/2021   ALT 28 05/16/2021   AST 19 05/16/2021   NA 138 05/16/2021   K 3.9 05/16/2021   CL 101 05/16/2021   CREATININE 1.13 05/16/2021   BUN 17 05/16/2021   CO2 27 05/16/2021   TSH 1.07 06/21/2021   PSA 0.57 05/16/2021   HGBA1C 5.4 01/13/2020    Exercise Tolerance Test  Result Date: 05/11/2019  There was no ST segment deviation noted during stress.  Normal study     Assessment & Plan:   Problem List Items Addressed This Visit       Cardiovascular and Mediastinum   HTN (hypertension)    Resolved       Relevant Orders   TSH   Urinalysis   CBC with Differential/Platelet   Lipid panel   PSA   Comprehensive metabolic panel   Testosterone   Vitamin B12   VITAMIN D 25 Hydroxy (Vit-D Deficiency, Fractures)     Endocrine   Hypogonadism male   Relevant Medications   Testosterone 20.25 MG/ACT (1.62%) GEL   Other Relevant Orders   TSH   Urinalysis   CBC with Differential/Platelet   Lipid panel   PSA   Comprehensive metabolic panel   Testosterone   Vitamin B12   VITAMIN D 25 Hydroxy (Vit-D Deficiency, Fractures)     Other   Fatigue   Relevant Orders   Vitamin B12   VITAMIN D 25 Hydroxy (Vit-D Deficiency, Fractures)   History of COVID-19 - Primary   Morbid obesity (HCC)    Pt lost wt on diet BMI 36       Relevant Medications   cloNIDine HCl (KAPVAY) 0.1 MG TB12 ER tablet   amphetamine-dextroamphetamine (ADDERALL) 20 MG tablet   Other Relevant Orders   TSH   Urinalysis   CBC with Differential/Platelet   Lipid panel   PSA   Comprehensive metabolic panel   Testosterone   Vitamin B12   VITAMIN D 25 Hydroxy (Vit-D Deficiency, Fractures)      Meds ordered this encounter  Medications   Testosterone 20.25 MG/ACT (1.62%) GEL    Sig: PLACE 3 PUMPS ONTO THE SKIN EVERY MORNING.    Dispense:  150 g    Refill:  5      Follow-up: No follow-ups on file.  Walker Kehr, MD

## 2022-08-14 NOTE — Assessment & Plan Note (Signed)
Resolved

## 2022-08-14 NOTE — Assessment & Plan Note (Signed)
Pt lost wt on diet BMI 36

## 2022-09-24 ENCOUNTER — Other Ambulatory Visit: Payer: Self-pay | Admitting: Internal Medicine

## 2022-09-24 DIAGNOSIS — L649 Androgenic alopecia, unspecified: Secondary | ICD-10-CM

## 2022-10-23 DIAGNOSIS — G4724 Circadian rhythm sleep disorder, free running type: Secondary | ICD-10-CM | POA: Diagnosis not present

## 2022-10-23 DIAGNOSIS — F900 Attention-deficit hyperactivity disorder, predominantly inattentive type: Secondary | ICD-10-CM | POA: Diagnosis not present

## 2022-10-23 DIAGNOSIS — F3341 Major depressive disorder, recurrent, in partial remission: Secondary | ICD-10-CM | POA: Diagnosis not present

## 2022-11-04 DIAGNOSIS — M9902 Segmental and somatic dysfunction of thoracic region: Secondary | ICD-10-CM | POA: Diagnosis not present

## 2022-11-04 DIAGNOSIS — M9901 Segmental and somatic dysfunction of cervical region: Secondary | ICD-10-CM | POA: Diagnosis not present

## 2022-11-06 DIAGNOSIS — H5310 Unspecified subjective visual disturbances: Secondary | ICD-10-CM | POA: Diagnosis not present

## 2022-11-06 DIAGNOSIS — H5213 Myopia, bilateral: Secondary | ICD-10-CM | POA: Diagnosis not present

## 2022-11-11 DIAGNOSIS — M9901 Segmental and somatic dysfunction of cervical region: Secondary | ICD-10-CM | POA: Diagnosis not present

## 2022-11-11 DIAGNOSIS — M9902 Segmental and somatic dysfunction of thoracic region: Secondary | ICD-10-CM | POA: Diagnosis not present

## 2022-11-17 DIAGNOSIS — M9902 Segmental and somatic dysfunction of thoracic region: Secondary | ICD-10-CM | POA: Diagnosis not present

## 2022-11-17 DIAGNOSIS — M9901 Segmental and somatic dysfunction of cervical region: Secondary | ICD-10-CM | POA: Diagnosis not present

## 2022-11-24 DIAGNOSIS — M9902 Segmental and somatic dysfunction of thoracic region: Secondary | ICD-10-CM | POA: Diagnosis not present

## 2022-11-24 DIAGNOSIS — M9901 Segmental and somatic dysfunction of cervical region: Secondary | ICD-10-CM | POA: Diagnosis not present

## 2022-12-02 DIAGNOSIS — M9902 Segmental and somatic dysfunction of thoracic region: Secondary | ICD-10-CM | POA: Diagnosis not present

## 2022-12-02 DIAGNOSIS — M9901 Segmental and somatic dysfunction of cervical region: Secondary | ICD-10-CM | POA: Diagnosis not present

## 2022-12-26 ENCOUNTER — Ambulatory Visit (INDEPENDENT_AMBULATORY_CARE_PROVIDER_SITE_OTHER): Payer: BC Managed Care – PPO | Admitting: Internal Medicine

## 2022-12-26 VITALS — BP 132/68 | HR 88 | Temp 97.8°F | Ht 70.0 in | Wt 250.0 lb

## 2022-12-26 DIAGNOSIS — I10 Essential (primary) hypertension: Secondary | ICD-10-CM

## 2022-12-26 DIAGNOSIS — M79675 Pain in left toe(s): Secondary | ICD-10-CM | POA: Insufficient documentation

## 2022-12-26 DIAGNOSIS — F319 Bipolar disorder, unspecified: Secondary | ICD-10-CM | POA: Diagnosis not present

## 2022-12-26 MED ORDER — DOXYCYCLINE HYCLATE 100 MG PO TABS: 100.0000 mg | ORAL_TABLET | Freq: Two times a day (BID) | ORAL | 0 refills | Status: DC

## 2022-12-26 NOTE — Progress Notes (Unsigned)
Patient ID: Herbert Bryant, male   DOB: 1980/09/13, 42 y.o.   MRN: [id number]        Chief Complaint: follow up with pain swelling left second toe,htn,        HPI:  Herbert Bryant is a 42 y.o. male here with c/o acute onset 5 days non traumatic second toe redness swelling pain x 3 days, without fever, drainage or red streaks.  Pt denies chest pain, increased sob or doe, wheezing, orthopnea, PND, increased LE swelling, palpitations, dizziness or syncope.   Pt denies polydipsia, polyuria, or new focal neuro s/s.    Pt denies fever, wt loss, night sweats, loss of appetite, or other constitutional symptoms   Denies worsening depressive symptoms, suicidal ideation, or panic; has ongoing anxiety       Wt Readings from Last 3 Encounters:  12/26/22 250 lb (113.4 kg)  08/14/22 252 lb (114.3 kg)  06/27/21 274 lb (124.3 kg)   BP Readings from Last 3 Encounters:  12/26/22 132/68  08/14/22 126/82  06/27/21 (!) 160/80         Past Medical History:  Diagnosis Date   Acne    ADD (attention deficit disorder)    ADHD (attention deficit hyperactivity disorder)    Allergy    Anxiety    Bipolar 1 disorder (HCC)    Depression    GERD (gastroesophageal reflux disease)    Hypertension    IBS (irritable bowel syndrome)    Past Surgical History:  Procedure Laterality Date   MOUTH SURGERY  1993   skin graft   WISDOM TOOTH EXTRACTION      reports that he quit smoking about 10 years ago. His smoking use included cigarettes. He smoked an average of .5 packs per day. He has never used smokeless tobacco. He reports current alcohol use. He reports current drug use. Drug: Marijuana. family history includes Alcohol abuse in his mother; Alzheimer's disease in his maternal grandfather and maternal grandmother; Arthritis in his maternal grandfather and maternal grandmother; Depression in his mother; Emphysema in his paternal grandfather; Hypertension in his maternal grandmother; Skin cancer in his father and  mother; Stroke in his maternal grandmother. Allergies  Allergen Reactions   Geodon [Ziprasidone]    Losartan     Diarrhea, CP, fatigue   Losartan Potassium-Hctz Other (See Comments)   Current Outpatient Medications on File Prior to Visit  Medication Sig Dispense Refill   ALPRAZolam (XANAX) 0.5 MG tablet Take 0.25-0.5 mg by mouth 3 (three) times daily as needed. Anxiety      amphetamine-dextroamphetamine (ADDERALL) 20 MG tablet Take 20 mg by mouth 3 (three) times daily.     cetirizine (ZYRTEC) 10 MG tablet Take 10 mg by mouth daily.     Cholecalciferol (VITAMIN D3) 50 MCG (2000 UT) capsule Take 1 capsule (2,000 Units total) by mouth daily. 100 capsule 3   clobetasol ointment (TEMOVATE) 0.05 % Apply 1 application topically 2 (two) times daily. Apply topically twice a day     cloNIDine HCl (KAPVAY) 0.1 MG TB12 ER tablet Take by mouth.     Cyanocobalamin (VITAMIN B-12) 500 MCG SUBL Place 1 tablet (500 mcg total) under the tongue 1 day or 1 dose. 100 tablet 3   finasteride (PROPECIA) 1 MG tablet TAKE 1/4 (ONE-QUARTER) TABLET BY MOUTH EVERY DAY 23 tablet 5   fish oil-omega-3 fatty acids 1000 MG capsule Take 1 g by mouth daily.      hydroquinone 4 % cream as directed Externally bid, 2  months on then 1 month break for 60 days     L-Methylfolate-Algae (DEPLIN 7.5 PO) Take 1 capsule by mouth daily.     MINOXIDIL, TOPICAL, 5 % SOLN Apply topically 2 (two) times a day.     Multiple Minerals-Vitamins (CITRACAL PLUS PO) Take by mouth daily.     omeprazole (PRILOSEC) 20 MG capsule Take 20 mg by mouth daily.     oxymetazoline (NASAL RELIEF) 0.05 % nasal spray Place 1 spray into both nostrils as needed.     Tasimelteon 20 MG CAPS Take by mouth at bedtime.      Testosterone 20.25 MG/ACT (1.62%) GEL PLACE 3 PUMPS ONTO THE SKIN EVERY MORNING. 150 g 5   traZODone (DESYREL) 50 MG tablet Take 50-150 mg by mouth at bedtime as needed for sleep.     tretinoin (RETIN-A) 0.025 % cream 1 application to affected area  in the evening to face Externally Once a day for 30 Days     triamcinolone (NASACORT ALLERGY 24HR) 55 MCG/ACT AERO nasal inhaler 1 puff in each nostril Nasally Once a day     ARIPiprazole (ABILIFY) 2 MG tablet Take 2 mg by mouth daily. (Patient not taking: Reported on 12/26/2022)     Brimonidine Tartrate (MIRVASO) 0.33 % GEL APPLY DAILY TOPICALLY Externally Once a day for 30 days (Patient not taking: Reported on 12/26/2022)     Horse Chestnut 300 MG CAPS as directed Orally (Patient not taking: Reported on 12/26/2022)     Lifitegrast (XIIDRA) 5 % SOLN Apply 1 drop to eye in the morning and at bedtime. (Patient not taking: Reported on 08/14/2022)     liothyronine (CYTOMEL) 5 MCG tablet Take 1 tablet (5 mcg total) by mouth daily. (Patient not taking: Reported on 08/14/2022) 30 tablet 5   promethazine (PHENERGAN) 25 MG tablet Take 1-2 tablets (25-50 mg total) by mouth at bedtime. (Patient not taking: Reported on 08/14/2022) 30 tablet 0   viloxazine ER (QELBREE) 200 MG 24 hr capsule Take 400 mg by mouth daily. (Patient not taking: Reported on 08/14/2022)     No current facility-administered medications on file prior to visit.        ROS:  All others reviewed and negative.  Objective        PE:  BP 132/68 (BP Location: Left Arm, Patient Position: Sitting, Cuff Size: Normal)   Pulse 88   Temp 97.8 F (36.6 C) (Oral)   Ht 5\' 10"  (1.778 m)   Wt 250 lb (113.4 kg)   SpO2 97%   BMI 35.87 kg/m                 Constitutional: Pt appears in NAD               HENT: Head: NCAT.                Right Ear: External ear normal.                 Left Ear: External ear normal.                Eyes: . Pupils are equal, round, and reactive to light. Conjunctivae and EOM are normal               Nose: without d/c or deformity               Neck: Neck supple. Gross normal ROM  Cardiovascular: Normal rate and regular rhythm.                 Pulmonary/Chest: Effort normal and breath sounds without rales or  wheezing.                               Neurological: Pt is alert. At baseline orientation, motor grossly intact               Skin: Skin is warm. LE edema - none; left second toe with 1-2+ red, tender, swelling without drainage or red streaks               Psychiatric: Pt behavior is normal without agitation , 1+  nervous  Micro: none  Cardiac tracings I have personally interpreted today:  none  Pertinent Radiological findings (summarize): none   Lab Results  Component Value Date   WBC 8.9 05/16/2021   HGB 15.4 05/16/2021   HCT 46.3 05/16/2021   PLT 172.0 05/16/2021   GLUCOSE 84 05/16/2021   CHOL 193 05/16/2021   TRIG 190.0 (H) 05/16/2021   HDL 46.00 05/16/2021   LDLCALC 109 (H) 05/16/2021   ALT 28 05/16/2021   AST 19 05/16/2021   NA 138 05/16/2021   K 3.9 05/16/2021   CL 101 05/16/2021   CREATININE 1.13 05/16/2021   BUN 17 05/16/2021   CO2 27 05/16/2021   TSH 1.07 06/21/2021   PSA 0.57 05/16/2021   HGBA1C 5.4 01/13/2020   Assessment/Plan:  Herbert Bryant is a 42 y.o. White or Caucasian [1] male with  has a past medical history of Acne, ADD (attention deficit disorder), ADHD (attention deficit hyperactivity disorder), Allergy, Anxiety, Bipolar 1 disorder (HCC), Depression, GERD (gastroesophageal reflux disease), Hypertension, and IBS (irritable bowel syndrome).  Toe pain, left Likely cellulitis it seems - Mild to mod, for antibx course doxycycline 100 bid,  to f/u any worsening symptoms or concerns  Bipolar depression (HCC) Chronic persistent, declines need for change in tx at this time, likely situational anxiety it seems  HTN (hypertension) BP Readings from Last 3 Encounters:  12/26/22 132/68  08/14/22 126/82  06/27/21 (!) 160/80   Stable, pt to continue medical treatment  - diet, wt control  Followup: Return if symptoms worsen or fail to improve.  Oliver Barre, MD 12/27/2022 6:20 PM [facility]  Med[facility]

## 2022-12-26 NOTE — Patient Instructions (Signed)
Please take all new medication as prescribed - the antibiotic  Please continue all other medications as before, and refills have been done if requested.  Please have the pharmacy call with any other refills you may need.  Please keep your appointments with your specialists as you may have planned   

## 2022-12-27 ENCOUNTER — Encounter: Payer: Self-pay | Admitting: Internal Medicine

## 2022-12-27 NOTE — Assessment & Plan Note (Signed)
BP Readings from Last 3 Encounters:  12/26/22 132/68  08/14/22 126/82  06/27/21 (!) 160/80   Stable, pt to continue medical treatment  - diet, wt control

## 2022-12-27 NOTE — Assessment & Plan Note (Signed)
Likely cellulitis it seems - Mild to mod, for antibx course doxycycline 100 bid,  to f/u any worsening symptoms or concerns

## 2022-12-27 NOTE — Assessment & Plan Note (Signed)
Chronic persistent, declines need for change in tx at this time, likely situational anxiety it seems

## 2023-02-04 DIAGNOSIS — R635 Abnormal weight gain: Secondary | ICD-10-CM | POA: Diagnosis not present

## 2023-02-04 DIAGNOSIS — Z131 Encounter for screening for diabetes mellitus: Secondary | ICD-10-CM | POA: Diagnosis not present

## 2023-02-04 DIAGNOSIS — Z130 Encounter for screening for diseases of the blood and blood-forming organs and certain disorders involving the immune mechanism: Secondary | ICD-10-CM | POA: Diagnosis not present

## 2023-02-04 DIAGNOSIS — Z125 Encounter for screening for malignant neoplasm of prostate: Secondary | ICD-10-CM | POA: Diagnosis not present

## 2023-02-04 DIAGNOSIS — Z136 Encounter for screening for cardiovascular disorders: Secondary | ICD-10-CM | POA: Diagnosis not present

## 2023-02-04 DIAGNOSIS — Z1329 Encounter for screening for other suspected endocrine disorder: Secondary | ICD-10-CM | POA: Diagnosis not present

## 2023-02-04 DIAGNOSIS — E559 Vitamin D deficiency, unspecified: Secondary | ICD-10-CM | POA: Diagnosis not present

## 2023-02-04 DIAGNOSIS — Z6835 Body mass index (BMI) 35.0-35.9, adult: Secondary | ICD-10-CM | POA: Diagnosis not present

## 2023-02-06 DIAGNOSIS — Z79890 Hormone replacement therapy (postmenopausal): Secondary | ICD-10-CM | POA: Diagnosis not present

## 2023-02-06 DIAGNOSIS — R7309 Other abnormal glucose: Secondary | ICD-10-CM | POA: Diagnosis not present

## 2023-02-06 DIAGNOSIS — E559 Vitamin D deficiency, unspecified: Secondary | ICD-10-CM | POA: Diagnosis not present

## 2023-02-06 DIAGNOSIS — E7800 Pure hypercholesterolemia, unspecified: Secondary | ICD-10-CM | POA: Diagnosis not present

## 2023-02-06 DIAGNOSIS — E291 Testicular hypofunction: Secondary | ICD-10-CM | POA: Diagnosis not present

## 2023-02-06 DIAGNOSIS — E611 Iron deficiency: Secondary | ICD-10-CM | POA: Diagnosis not present

## 2023-02-12 DIAGNOSIS — E559 Vitamin D deficiency, unspecified: Secondary | ICD-10-CM | POA: Diagnosis not present

## 2023-02-12 DIAGNOSIS — E611 Iron deficiency: Secondary | ICD-10-CM | POA: Diagnosis not present

## 2023-02-12 DIAGNOSIS — Z1331 Encounter for screening for depression: Secondary | ICD-10-CM | POA: Diagnosis not present

## 2023-02-12 DIAGNOSIS — Z6836 Body mass index (BMI) 36.0-36.9, adult: Secondary | ICD-10-CM | POA: Diagnosis not present

## 2023-02-12 DIAGNOSIS — E7800 Pure hypercholesterolemia, unspecified: Secondary | ICD-10-CM | POA: Diagnosis not present

## 2023-02-23 ENCOUNTER — Telehealth: Payer: Self-pay | Admitting: Internal Medicine

## 2023-02-23 DIAGNOSIS — E291 Testicular hypofunction: Secondary | ICD-10-CM

## 2023-02-23 NOTE — Telephone Encounter (Signed)
Prescription Request  02/23/2023  LOV: 08/14/2022  What is the name of the medication or equipment?  Testosterone 20.25 MG/ACT (1.62%) GEL  Have you contacted your pharmacy to request a refill? Yes   Which pharmacy would you like this sent to?  [id number] - [place], [place] - [address] AT [facility] [address] [place] [address] Phone: [phone] Fax: [phone]    Patient notified that their request is being sent to the clinical staff for review and that they should receive a response within 2 business days.   Please advise at Mobile [phone] (mobile)

## 2023-02-25 MED ORDER — TESTOSTERONE 20.25 MG/ACT (1.62%) TD GEL: TRANSDERMAL | 0 refills | Status: DC

## 2023-02-25 NOTE — Telephone Encounter (Signed)
I will renew 1 month.  She needs an office visit with me every 6 months.  Thank you

## 2023-02-25 NOTE — Telephone Encounter (Signed)
Called pt no answer LMOM will need ov for next refill.Marland KitchenRaechel Chute

## 2023-03-02 DIAGNOSIS — Z6837 Body mass index (BMI) 37.0-37.9, adult: Secondary | ICD-10-CM | POA: Diagnosis not present

## 2023-03-02 DIAGNOSIS — E559 Vitamin D deficiency, unspecified: Secondary | ICD-10-CM | POA: Diagnosis not present

## 2023-03-17 DIAGNOSIS — E559 Vitamin D deficiency, unspecified: Secondary | ICD-10-CM | POA: Diagnosis not present

## 2023-03-17 DIAGNOSIS — Z6837 Body mass index (BMI) 37.0-37.9, adult: Secondary | ICD-10-CM | POA: Diagnosis not present

## 2023-03-30 DIAGNOSIS — E559 Vitamin D deficiency, unspecified: Secondary | ICD-10-CM | POA: Diagnosis not present

## 2023-03-30 DIAGNOSIS — Z6837 Body mass index (BMI) 37.0-37.9, adult: Secondary | ICD-10-CM | POA: Diagnosis not present

## 2023-03-30 DIAGNOSIS — E611 Iron deficiency: Secondary | ICD-10-CM | POA: Diagnosis not present

## 2023-04-03 ENCOUNTER — Ambulatory Visit: Payer: BC Managed Care – PPO | Admitting: Internal Medicine

## 2023-04-08 DIAGNOSIS — G4720 Circadian rhythm sleep disorder, unspecified type: Secondary | ICD-10-CM | POA: Diagnosis not present

## 2023-04-08 DIAGNOSIS — F900 Attention-deficit hyperactivity disorder, predominantly inattentive type: Secondary | ICD-10-CM | POA: Diagnosis not present

## 2023-04-08 DIAGNOSIS — F331 Major depressive disorder, recurrent, moderate: Secondary | ICD-10-CM | POA: Diagnosis not present

## 2023-04-14 DIAGNOSIS — Z6837 Body mass index (BMI) 37.0-37.9, adult: Secondary | ICD-10-CM | POA: Diagnosis not present

## 2023-04-14 DIAGNOSIS — E7800 Pure hypercholesterolemia, unspecified: Secondary | ICD-10-CM | POA: Diagnosis not present

## 2023-04-20 ENCOUNTER — Ambulatory Visit (INDEPENDENT_AMBULATORY_CARE_PROVIDER_SITE_OTHER): Payer: BC Managed Care – PPO | Admitting: Internal Medicine

## 2023-04-20 ENCOUNTER — Encounter: Payer: Self-pay | Admitting: Internal Medicine

## 2023-04-20 VITALS — BP 130/76 | HR 75 | Temp 98.2°F | Ht 70.0 in | Wt 260.0 lb

## 2023-04-20 DIAGNOSIS — I10 Essential (primary) hypertension: Secondary | ICD-10-CM | POA: Diagnosis not present

## 2023-04-20 DIAGNOSIS — L304 Erythema intertrigo: Secondary | ICD-10-CM | POA: Insufficient documentation

## 2023-04-20 DIAGNOSIS — F909 Attention-deficit hyperactivity disorder, unspecified type: Secondary | ICD-10-CM

## 2023-04-20 DIAGNOSIS — E66812 Obesity, class 2: Secondary | ICD-10-CM

## 2023-04-20 DIAGNOSIS — F988 Other specified behavioral and emotional disorders with onset usually occurring in childhood and adolescence: Secondary | ICD-10-CM | POA: Insufficient documentation

## 2023-04-20 DIAGNOSIS — F319 Bipolar disorder, unspecified: Secondary | ICD-10-CM

## 2023-04-20 DIAGNOSIS — G479 Sleep disorder, unspecified: Secondary | ICD-10-CM | POA: Diagnosis not present

## 2023-04-20 DIAGNOSIS — E291 Testicular hypofunction: Secondary | ICD-10-CM

## 2023-04-20 DIAGNOSIS — R5383 Other fatigue: Secondary | ICD-10-CM | POA: Diagnosis not present

## 2023-04-20 DIAGNOSIS — Z6837 Body mass index (BMI) 37.0-37.9, adult: Secondary | ICD-10-CM

## 2023-04-20 LAB — COMPREHENSIVE METABOLIC PANEL
ALT: 13 U/L (ref 0–53)
AST: 13 U/L (ref 0–37)
Albumin: 4.2 g/dL (ref 3.5–5.2)
Alkaline Phosphatase: 66 U/L (ref 39–117)
BUN: 15 mg/dL (ref 6–23)
CO2: 27 meq/L (ref 19–32)
Calcium: 8.8 mg/dL (ref 8.4–10.5)
Chloride: 103 meq/L (ref 96–112)
Creatinine, Ser: 1.05 mg/dL (ref 0.40–1.50)
GFR: 87.56 mL/min (ref >60.00)
Glucose, Bld: 118 mg/dL — ABNORMAL HIGH (ref 70–99)
Potassium: 4 meq/L (ref 3.5–5.1)
Sodium: 140 meq/L (ref 135–145)
Total Bilirubin: 0.5 mg/dL (ref 0.2–1.2)
Total Protein: 6.5 g/dL (ref 6.0–8.3)

## 2023-04-20 LAB — URINALYSIS
Bilirubin Urine: NEGATIVE
Hgb urine dipstick: NEGATIVE
Ketones, ur: NEGATIVE
Leukocytes,Ua: NEGATIVE
Nitrite: NEGATIVE
Specific Gravity, Urine: >=1.03 — AB (ref 1.000–1.030)
Total Protein, Urine: NEGATIVE
Urine Glucose: NEGATIVE
Urobilinogen, UA: 0.2 (ref 0.0–1.0)
pH: 6 (ref 5.0–8.0)

## 2023-04-20 LAB — LIPID PANEL
Cholesterol: 190 mg/dL (ref 0–200)
HDL: 53 mg/dL (ref >39.00)
LDL Cholesterol: 106 mg/dL — ABNORMAL HIGH (ref 0–99)
NonHDL: 137.34
Total CHOL/HDL Ratio: 4
Triglycerides: 156 mg/dL — ABNORMAL HIGH (ref 0.0–149.0)
VLDL: 31.2 mg/dL (ref 0.0–40.0)

## 2023-04-20 LAB — TESTOSTERONE: Testosterone: 1086.96 ng/dL — ABNORMAL HIGH (ref 300.00–890.00)

## 2023-04-20 LAB — CBC WITH DIFFERENTIAL/PLATELET
Basophils Absolute: 0 10*3/uL (ref 0.0–0.1)
Basophils Relative: 0.3 % (ref 0.0–3.0)
Eosinophils Absolute: 0.2 10*3/uL (ref 0.0–0.7)
Eosinophils Relative: 3.1 % (ref 0.0–5.0)
HCT: 45.6 % (ref 39.0–52.0)
Hemoglobin: 15.1 g/dL (ref 13.0–17.0)
Lymphocytes Relative: 25.1 % (ref 12.0–46.0)
Lymphs Abs: 1.6 10*3/uL (ref 0.7–4.0)
MCHC: 33.1 g/dL (ref 30.0–36.0)
MCV: 89.9 fL (ref 78.0–100.0)
Monocytes Absolute: 0.4 10*3/uL (ref 0.1–1.0)
Monocytes Relative: 5.9 % (ref 3.0–12.0)
Neutro Abs: 4.3 10*3/uL (ref 1.4–7.7)
Neutrophils Relative %: 65.6 % (ref 43.0–77.0)
Platelets: 182 10*3/uL (ref 150.0–400.0)
RBC: 5.08 Mil/uL (ref 4.22–5.81)
RDW: 13.6 % (ref 11.5–15.5)
WBC: 6.6 10*3/uL (ref 4.0–10.5)

## 2023-04-20 LAB — PSA: PSA: 0.51 ng/mL (ref 0.10–4.00)

## 2023-04-20 LAB — VITAMIN D 25 HYDROXY (VIT D DEFICIENCY, FRACTURES): VITD: 35.93 ng/mL (ref 30.00–100.00)

## 2023-04-20 LAB — TSH: TSH: 0.77 u[IU]/mL (ref 0.35–5.50)

## 2023-04-20 LAB — VITAMIN B12: Vitamin B-12: 496 pg/mL (ref 211–911)

## 2023-04-20 MED ORDER — KETOCONAZOLE 2 % EX CREA: 1.0000 | TOPICAL_CREAM | Freq: Two times a day (BID) | CUTANEOUS | 1 refills | Status: AC

## 2023-04-20 NOTE — Assessment & Plan Note (Signed)
Getting labs [facility] - Semaglutite 10 mg?

## 2023-04-20 NOTE — Assessment & Plan Note (Addendum)
Circadian rhythm reversal - 30 h awake - 15 h sleep cycle - it may vary OSA for CPAP

## 2023-04-20 NOTE — Progress Notes (Signed)
Subjective:  Patient ID: Herbert Bryant, male    DOB: [birth year]  Age: 42 y.o. MRN: [id number]  CC: Rash (3-4 weeks. Itching, Bleeding, dry skin with flakes, red )   HPI Herbert Bryant presents for hypogonadism, depression, sleep disorder C/o rash x x 3 wks  Getting labs [facility] - Semaglutite 10 mg?    Outpatient Medications Prior to Visit  Medication Sig Dispense Refill   ALPRAZolam (XANAX) 0.5 MG tablet Take 0.25-0.5 mg by mouth 3 (three) times daily as needed. Anxiety      buPROPion (WELLBUTRIN XL) 150 MG 24 hr tablet Take 150 mg by mouth every morning.     cetirizine (ZYRTEC) 10 MG tablet Take 10 mg by mouth daily.     Cholecalciferol (VITAMIN D3) 50 MCG (2000 UT) capsule Take 1 capsule (2,000 Units total) by mouth daily. (Patient taking differently: Take 2,000 Units by mouth 2 (two) times daily.) 100 capsule 3   cloNIDine HCl (KAPVAY) 0.1 MG TB12 ER tablet Take by mouth.     Cyanocobalamin (VITAMIN B-12) 500 MCG SUBL Place 1 tablet (500 mcg total) under the tongue 1 day or 1 dose. 100 tablet 3   finasteride (PROPECIA) 1 MG tablet TAKE 1/4 (ONE-QUARTER) TABLET BY MOUTH EVERY DAY 23 tablet 5   fish oil-omega-3 fatty acids 1000 MG capsule Take 1 g by mouth daily.      L-Methylfolate-Algae (DEPLIN 7.5 PO) Take 1 capsule by mouth daily.     methylphenidate (RITALIN) 20 MG tablet Take 20 mg by mouth 3 (three) times daily.     MINOXIDIL, TOPICAL, 5 % SOLN Apply topically 2 (two) times a day.     Multiple Minerals-Vitamins (CITRACAL PLUS PO) Take by mouth daily.     omeprazole (PRILOSEC) 20 MG capsule Take 20 mg by mouth daily.     oxymetazoline (NASAL RELIEF) 0.05 % nasal spray Place 1 spray into both nostrils as needed.     Tasimelteon 20 MG CAPS Take by mouth at bedtime.      Testosterone 20.25 MG/ACT (1.62%) GEL PLACE 3 PUMPS ONTO THE SKIN EVERY MORNING. 75 g 0   traZODone (DESYREL) 50 MG tablet Take 50-150 mg by mouth at bedtime as needed for sleep.     tretinoin (RETIN-A)  0.025 % cream 1 application to affected area in the evening to face Externally Once a day for 30 Days     triamcinolone (NASACORT ALLERGY 24HR) 55 MCG/ACT AERO nasal inhaler 1 puff in each nostril Nasally Once a day     Vilazodone HCl 20 MG TABS Take 20 mg by mouth daily.     Brimonidine Tartrate (MIRVASO) 0.33 % GEL APPLY DAILY TOPICALLY Externally Once a day for 30 days (Patient not taking: Reported on 12/26/2022)     clobetasol ointment (TEMOVATE) 0.05 % Apply 1 application topically 2 (two) times daily. Apply topically twice a day (Patient not taking: Reported on 04/20/2023)     Horse Chestnut 300 MG CAPS as directed Orally (Patient not taking: Reported on 12/26/2022)     hydroquinone 4 % cream as directed Externally bid, 2 months on then 1 month break for 60 days (Patient not taking: Reported on 04/20/2023)     Lifitegrast (XIIDRA) 5 % SOLN Apply 1 drop to eye in the morning and at bedtime. (Patient not taking: Reported on 08/14/2022)     promethazine (PHENERGAN) 25 MG tablet Take 1-2 tablets (25-50 mg total) by mouth at bedtime. (Patient not taking: Reported on 08/14/2022) 30 tablet  0   amphetamine-dextroamphetamine (ADDERALL) 20 MG tablet Take 20 mg by mouth 3 (three) times daily. (Patient not taking: Reported on 04/20/2023)     ARIPiprazole (ABILIFY) 2 MG tablet Take 2 mg by mouth daily. (Patient not taking: Reported on 12/26/2022)     doxycycline (VIBRA-TABS) 100 MG tablet Take 1 tablet (100 mg total) by mouth 2 (two) times daily. (Patient not taking: Reported on 04/20/2023) 20 tablet 0   liothyronine (CYTOMEL) 5 MCG tablet Take 1 tablet (5 mcg total) by mouth daily. (Patient not taking: Reported on 08/14/2022) 30 tablet 5   viloxazine ER (QELBREE) 200 MG 24 hr capsule Take 400 mg by mouth daily. (Patient not taking: Reported on 08/14/2022)     No facility-administered medications prior to visit.    ROS: Review of Systems  Constitutional:  Positive for fatigue. Negative for appetite change and  unexpected weight change.  HENT:  Negative for congestion, nosebleeds, sneezing, sore throat and trouble swallowing.   Eyes:  Negative for itching and visual disturbance.  Respiratory:  Negative for cough.   Cardiovascular:  Negative for chest pain, palpitations and leg swelling.  Gastrointestinal:  Negative for abdominal distention, blood in stool, diarrhea and nausea.  Genitourinary:  Negative for frequency and hematuria.  Musculoskeletal:  Negative for back pain, gait problem, joint swelling and neck pain.  Skin:  Positive for color change and rash.  Neurological:  Negative for dizziness, tremors, speech difficulty and weakness.  Psychiatric/Behavioral:  Negative for agitation, dysphoric mood, sleep disturbance and suicidal ideas. The patient is not nervous/anxious.     Objective:  BP 130/76 (BP Location: Left Arm, Patient Position: Sitting, Cuff Size: Large)   Pulse 75   Temp 98.2 F (36.8 C) (Oral)   Ht 5\' 10"  (1.778 m)   Wt 260 lb (117.9 kg)   SpO2 98%   BMI 37.31 kg/m   BP Readings from Last 3 Encounters:  04/20/23 130/76  12/26/22 132/68  08/14/22 126/82    Wt Readings from Last 3 Encounters:  04/20/23 260 lb (117.9 kg)  12/26/22 250 lb (113.4 kg)  08/14/22 252 lb (114.3 kg)    Physical Exam Constitutional:      General: He is not in acute distress.    Appearance: He is well-developed. He is obese.     Comments: NAD  Eyes:     Conjunctiva/sclera: Conjunctivae normal.     Pupils: Pupils are equal, round, and reactive to light.  Neck:     Thyroid: No thyromegaly.     Vascular: No JVD.  Cardiovascular:     Rate and Rhythm: Normal rate and regular rhythm.     Heart sounds: Normal heart sounds. No murmur heard.    No friction rub. No gallop.  Pulmonary:     Effort: Pulmonary effort is normal. No respiratory distress.     Breath sounds: Normal breath sounds. No wheezing or rales.  Chest:     Chest wall: No tenderness.  Abdominal:     General: Bowel sounds  are normal. There is no distension.     Palpations: Abdomen is soft. There is no mass.     Tenderness: There is no abdominal tenderness. There is no guarding or rebound.  Musculoskeletal:        General: No tenderness. Normal range of motion.     Cervical back: Normal range of motion.  Lymphadenopathy:     Cervical: No cervical adenopathy.  Skin:    General: Skin is warm and dry.  Findings: No rash.  Neurological:     Mental Status: He is alert and oriented to person, place, and time.     Cranial Nerves: No cranial nerve deficit.     Motor: No abnormal muscle tone.     Coordination: Coordination normal.     Gait: Gait normal.     Deep Tendon Reflexes: Reflexes are normal and symmetric.  Psychiatric:        Behavior: Behavior normal.        Thought Content: Thought content normal.        Judgment: Judgment normal.     Lab Results  Component Value Date   WBC 8.9 05/16/2021   HGB 15.4 05/16/2021   HCT 46.3 05/16/2021   PLT 172.0 05/16/2021   GLUCOSE 84 05/16/2021   CHOL 193 05/16/2021   TRIG 190.0 (H) 05/16/2021   HDL 46.00 05/16/2021   LDLCALC 109 (H) 05/16/2021   ALT 28 05/16/2021   AST 19 05/16/2021   NA 138 05/16/2021   K 3.9 05/16/2021   CL 101 05/16/2021   CREATININE 1.13 05/16/2021   BUN 17 05/16/2021   CO2 27 05/16/2021   TSH 1.07 06/21/2021   PSA 0.57 05/16/2021   HGBA1C 5.4 01/13/2020    Exercise Tolerance Test  Result Date: 05/11/2019  There was no ST segment deviation noted during stress.  Normal study     Assessment & Plan:   Problem List Items Addressed This Visit     Obesity    Getting labs [facility] - Semaglutite 10 mg?      Relevant Medications   methylphenidate (RITALIN) 20 MG tablet   Bipolar depression (HCC) - Primary    F/u w/Dr Evelene Croon On Xanax  On Deplin, Viibrid, Trazodone      Sleep disorder    Circadian rhythm reversal - 30 h awake - 15 h sleep cycle - it may vary OSA for CPAP      Intertrigo    Ketoconazole cream Rx  bid Check labs      ADD (attention deficit disorder)    On Ritalin         Meds ordered this encounter  Medications   ketoconazole (NIZORAL) 2 % cream    Sig: Apply 1 Application topically 2 (two) times daily.    Dispense:  60 g    Refill:  1      Follow-up: Return in about 6 months (around 10/19/2023) for a follow-up visit.  Sonda Primes, MD

## 2023-04-20 NOTE — Assessment & Plan Note (Signed)
On Ritalin 

## 2023-04-20 NOTE — Assessment & Plan Note (Signed)
Ketoconazole cream Rx bid Check labs

## 2023-04-20 NOTE — Assessment & Plan Note (Signed)
F/u w/Dr Evelene Croon On Xanax  On Deplin, Viibrid, Trazodone

## 2023-05-01 ENCOUNTER — Other Ambulatory Visit: Payer: Self-pay | Admitting: Internal Medicine

## 2023-05-01 DIAGNOSIS — E291 Testicular hypofunction: Secondary | ICD-10-CM

## 2023-05-12 DIAGNOSIS — F900 Attention-deficit hyperactivity disorder, predominantly inattentive type: Secondary | ICD-10-CM | POA: Diagnosis not present

## 2023-05-12 DIAGNOSIS — F3341 Major depressive disorder, recurrent, in partial remission: Secondary | ICD-10-CM | POA: Diagnosis not present

## 2023-05-12 DIAGNOSIS — G4720 Circadian rhythm sleep disorder, unspecified type: Secondary | ICD-10-CM | POA: Diagnosis not present

## 2023-05-12 DIAGNOSIS — F411 Generalized anxiety disorder: Secondary | ICD-10-CM | POA: Diagnosis not present

## 2023-05-29 ENCOUNTER — Other Ambulatory Visit: Payer: Self-pay | Admitting: Internal Medicine

## 2023-09-26 ENCOUNTER — Other Ambulatory Visit: Payer: Self-pay | Admitting: Internal Medicine

## 2023-09-26 DIAGNOSIS — L649 Androgenic alopecia, unspecified: Secondary | ICD-10-CM

## 2023-11-20 ENCOUNTER — Other Ambulatory Visit: Payer: Self-pay | Admitting: Internal Medicine

## 2023-11-20 DIAGNOSIS — E291 Testicular hypofunction: Secondary | ICD-10-CM

## 2023-11-24 ENCOUNTER — Telehealth: Payer: Self-pay

## 2023-11-24 NOTE — Telephone Encounter (Signed)
 Pharmacy Patient Advocate Encounter   Received notification from CoverMyMeds that prior authorization for Testosterone  20.25 MG/ACT(1.62%) gel  is required/requested.   Insurance verification completed.   The patient is insured through [facility] .   Per test claim: PA required; PA started via CoverMyMeds. KEY [id number] . Waiting for clinical questions to populate.

## 2023-11-25 NOTE — Telephone Encounter (Signed)
 Clinical questions have been answered and PA submitted.TO PLAN. PA currently Pending.

## 2023-11-27 ENCOUNTER — Other Ambulatory Visit (HOSPITAL_COMMUNITY): Payer: Self-pay

## 2023-11-27 NOTE — Telephone Encounter (Signed)
 Pharmacy Patient Advocate Encounter  Received notification from [employer] that Prior Authorization for Testosterone  20.25 MG/ACT(1.62%) gel  has been APPROVED from 11/25/23 to 11/24/24. Ran test claim, Copay is $4 (With both insurances). This test claim was processed through [facility]- copay amounts may vary at other pharmacies due to pharmacy/plan contracts, or as the patient moves through the different stages of their insurance plan.   PA #/Case ID/Reference #: [id number]

## 2023-11-27 NOTE — Telephone Encounter (Signed)
Additional information has been requested from the patient's insurance in order to proceed with the prior authorization request. Requested information has been sent, or form has been filled out and faxed back to [phone]

## 2023-12-11 ENCOUNTER — Ambulatory Visit: Admitting: Internal Medicine

## 2023-12-17 ENCOUNTER — Ambulatory Visit: Admitting: Internal Medicine

## 2023-12-24 ENCOUNTER — Ambulatory Visit: Admitting: Internal Medicine

## 2024-01-13 ENCOUNTER — Other Ambulatory Visit: Payer: Self-pay | Admitting: Internal Medicine

## 2024-01-13 DIAGNOSIS — E291 Testicular hypofunction: Secondary | ICD-10-CM

## 2024-01-14 ENCOUNTER — Encounter: Payer: Self-pay | Admitting: Internal Medicine

## 2024-01-14 ENCOUNTER — Ambulatory Visit: Admitting: Internal Medicine

## 2024-01-14 VITALS — BP 132/78 | HR 112 | Temp 98.7°F | Ht 70.0 in | Wt 314.0 lb

## 2024-01-14 DIAGNOSIS — E291 Testicular hypofunction: Secondary | ICD-10-CM

## 2024-01-14 DIAGNOSIS — E66812 Obesity, class 2: Secondary | ICD-10-CM | POA: Diagnosis not present

## 2024-01-14 DIAGNOSIS — R000 Tachycardia, unspecified: Secondary | ICD-10-CM

## 2024-01-14 DIAGNOSIS — F909 Attention-deficit hyperactivity disorder, unspecified type: Secondary | ICD-10-CM

## 2024-01-14 DIAGNOSIS — R5383 Other fatigue: Secondary | ICD-10-CM

## 2024-01-14 DIAGNOSIS — G479 Sleep disorder, unspecified: Secondary | ICD-10-CM | POA: Diagnosis not present

## 2024-01-14 DIAGNOSIS — Z6837 Body mass index (BMI) 37.0-37.9, adult: Secondary | ICD-10-CM

## 2024-01-14 DIAGNOSIS — L659 Nonscarring hair loss, unspecified: Secondary | ICD-10-CM

## 2024-01-14 MED ORDER — TESTOSTERONE 20.25 MG/ACT (1.62%) TD GEL: TRANSDERMAL | 5 refills | Status: DC

## 2024-01-14 MED ORDER — VITAMIN D (ERGOCALCIFEROL) 1.25 MG (50000 UNIT) PO CAPS: 50000.0000 [IU] | ORAL_CAPSULE | ORAL | 3 refills | Status: AC

## 2024-01-14 NOTE — Assessment & Plan Note (Signed)
 Wt Readings from Last 3 Encounters:  01/14/24 (!) 314 lb (142.4 kg)  04/20/23 260 lb (117.9 kg)  12/26/22 250 lb (113.4 kg)

## 2024-01-14 NOTE — Assessment & Plan Note (Signed)
 Herbert Bryant is using compounded finasteride  in minoxidil solution He had sexual side effects with oral finasteride 

## 2024-01-14 NOTE — Patient Instructions (Signed)

## 2024-01-14 NOTE — Assessment & Plan Note (Signed)
 Adderall was decreased

## 2024-01-14 NOTE — Assessment & Plan Note (Signed)
 Max wake hrs - 126 h awake last month

## 2024-01-14 NOTE — Assessment & Plan Note (Addendum)
 Sinus tachycardia, chronic.  Heart rate at home is 100-112.  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  Heart rate is 68 when he is asleep according to his fitbit.  No chest pain. His father has atrial fibrillation

## 2024-01-14 NOTE — Progress Notes (Signed)
 Subjective:  Patient ID: Herbert Bryant, male    DOB: 1980/12/05  Age: 43 y.o. MRN: [id number]  CC: Medical Management of Chronic Issues (Med follow up)   HPI Herbert Bryant presents for hypogonadism, sleep disorder, fatigue  Outpatient Medications Prior to Visit  Medication Sig Dispense Refill   ADDERALL 15 MG tablet Take 1 tablet by mouth 3 (three) times daily.     ALPRAZolam (XANAX) 0.5 MG tablet Take 0.25-0.5 mg by mouth 3 (three) times daily as needed. Anxiety      cetirizine (ZYRTEC) 10 MG tablet Take 10 mg by mouth daily.     Cholecalciferol (VITAMIN D3) 50 MCG (2000 UT) capsule Take 1 capsule (2,000 Units total) by mouth daily. 100 capsule 3   cloNIDine HCl (KAPVAY) 0.1 MG TB12 ER tablet Take by mouth.     Cyanocobalamin  (VITAMIN B-12) 500 MCG SUBL Place 1 tablet (500 mcg total) under the tongue 1 day or 1 dose. 100 tablet 3   fish oil-omega-3 fatty acids 1000 MG capsule Take 1 g by mouth daily.      ketoconazole  (NIZORAL ) 2 % cream Apply 1 Application topically 2 (two) times daily. 60 g 1   L-Methylfolate-Algae (DEPLIN 7.5 PO) Take 1 capsule by mouth daily.     methylphenidate (RITALIN) 20 MG tablet Take 20 mg by mouth 3 (three) times daily.     MINOXIDIL, TOPICAL, 5 % SOLN Apply topically 2 (two) times a day.     Multiple Minerals-Vitamins (CITRACAL PLUS PO) Take by mouth daily.     omeprazole (PRILOSEC) 20 MG capsule Take 20 mg by mouth daily.     oxymetazoline (NASAL RELIEF) 0.05 % nasal spray Place 1 spray into both nostrils as needed.     Tasimelteon 20 MG CAPS Take by mouth at bedtime.      traZODone (DESYREL) 50 MG tablet Take 50-150 mg by mouth at bedtime as needed for sleep.     tretinoin (RETIN-A) 0.025 % cream 1 application to affected area in the evening to face Externally Once a day for 30 Days     triamcinolone  (NASACORT  ALLERGY 24HR) 55 MCG/ACT AERO nasal inhaler 1 puff in each nostril Nasally Once a day     Vilazodone HCl 20 MG TABS Take 20 mg by mouth  daily.     Testosterone  20.25 MG/ACT (1.62%) GEL PLACE 3 PUMPS ONTO THE SKIN EVERY MORNING. 75 g 0   Brimonidine Tartrate (MIRVASO) 0.33 % GEL APPLY DAILY TOPICALLY Externally Once a day for 30 days (Patient not taking: Reported on 01/14/2024)     buPROPion (WELLBUTRIN XL) 150 MG 24 hr tablet Take 150 mg by mouth every morning. (Patient not taking: Reported on 01/14/2024)     clobetasol ointment (TEMOVATE) 0.05 % Apply 1 application topically 2 (two) times daily. Apply topically twice a day (Patient not taking: Reported on 01/14/2024)     finasteride  (PROPECIA ) 1 MG tablet TAKE 1/4 (ONE-QUARTER) TABLET BY MOUTH EVERY DAY (Patient not taking: Reported on 01/14/2024) 7 tablet 19   [place] 300 MG CAPS as directed Orally (Patient not taking: Reported on 01/14/2024)     hydroquinone 4 % cream as directed Externally bid, 2 months on then 1 month break for 60 days (Patient not taking: Reported on 01/14/2024)     Lifitegrast (XIIDRA) 5 % SOLN Apply 1 drop to eye in the morning and at bedtime. (Patient not taking: Reported on 01/14/2024)     modafinil (PROVIGIL) 100 MG tablet Take 100  mg by mouth every morning. (Patient not taking: Reported on 01/14/2024)     promethazine  (PHENERGAN ) 25 MG tablet Take 1-2 tablets (25-50 mg total) by mouth at bedtime. (Patient not taking: Reported on 01/14/2024) 30 tablet 0   No facility-administered medications prior to visit.    ROS: Review of Systems  Constitutional:  Negative for appetite change, fatigue and unexpected weight change.  HENT:  Negative for congestion, nosebleeds, sneezing, sore throat and trouble swallowing.   Eyes:  Negative for itching and visual disturbance.  Respiratory:  Negative for cough.   Cardiovascular:  Negative for chest pain, palpitations and leg swelling.  Gastrointestinal:  Negative for abdominal distention, blood in stool, diarrhea and nausea.  Genitourinary:  Negative for frequency and hematuria.  Musculoskeletal:  Negative for back pain, gait  problem, joint swelling and neck pain.  Skin:  Negative for rash.  Neurological:  Negative for dizziness, tremors, speech difficulty and weakness.  Psychiatric/Behavioral:  Negative for agitation, dysphoric mood and sleep disturbance. The patient is not nervous/anxious.     Objective:  BP 132/78 (BP Location: Left Arm, Patient Position: Sitting, Cuff Size: Large)   Pulse (!) 112   Temp 98.7 F (37.1 C) (Oral)   Ht 5' 10 (1.778 m)   Wt (!) 314 lb (142.4 kg)   BMI 45.05 kg/m   BP Readings from Last 3 Encounters:  01/14/24 132/78  04/20/23 130/76  12/26/22 132/68    Wt Readings from Last 3 Encounters:  01/14/24 (!) 314 lb (142.4 kg)  04/20/23 260 lb (117.9 kg)  12/26/22 250 lb (113.4 kg)    Physical Exam Constitutional:      General: He is not in acute distress.    Appearance: He is well-developed. He is obese.     Comments: NAD  Eyes:     Conjunctiva/sclera: Conjunctivae normal.     Pupils: Pupils are equal, round, and reactive to light.  Neck:     Thyroid : No thyromegaly.     Vascular: No JVD.  Cardiovascular:     Rate and Rhythm: Normal rate and regular rhythm.     Heart sounds: Normal heart sounds. No murmur heard.    No friction rub. No gallop.  Pulmonary:     Effort: Pulmonary effort is normal. No respiratory distress.     Breath sounds: Normal breath sounds. No wheezing or rales.  Chest:     Chest wall: No tenderness.  Abdominal:     General: Bowel sounds are normal. There is no distension.     Palpations: Abdomen is soft. There is no mass.     Tenderness: There is no abdominal tenderness. There is no guarding or rebound.  Musculoskeletal:        General: No tenderness. Normal range of motion.     Cervical back: Normal range of motion.     Right lower leg: No edema.     Left lower leg: No edema.  Lymphadenopathy:     Cervical: No cervical adenopathy.  Skin:    General: Skin is warm and dry.     Findings: No rash.  Neurological:     Mental Status: He  is alert and oriented to person, place, and time.     Cranial Nerves: No cranial nerve deficit.     Motor: No abnormal muscle tone.     Coordination: Coordination normal.     Gait: Gait normal.     Deep Tendon Reflexes: Reflexes are normal and symmetric.  Psychiatric:  Behavior: Behavior normal.        Thought Content: Thought content normal.        Judgment: Judgment normal.     Lab Results  Component Value Date   WBC 6.6 04/20/2023   HGB 15.1 04/20/2023   HCT 45.6 04/20/2023   PLT 182.0 04/20/2023   GLUCOSE 118 (H) 04/20/2023   CHOL 190 04/20/2023   TRIG 156.0 (H) 04/20/2023   HDL 53.00 04/20/2023   LDLCALC 106 (H) 04/20/2023   ALT 13 04/20/2023   AST 13 04/20/2023   NA 140 04/20/2023   K 4.0 04/20/2023   CL 103 04/20/2023   CREATININE 1.05 04/20/2023   BUN 15 04/20/2023   CO2 27 04/20/2023   TSH 0.77 04/20/2023   PSA 0.51 04/20/2023   HGBA1C 5.4 01/13/2020    Exercise Tolerance Test Result Date: 05/11/2019  There was no ST segment deviation noted during stress.  Normal study     Assessment & Plan:   Problem List Items Addressed This Visit     Obesity - Primary   Wt Readings from Last 3 Encounters:  01/14/24 (!) 314 lb (142.4 kg)  04/20/23 260 lb (117.9 kg)  12/26/22 250 lb (113.4 kg)         Relevant Medications   ADDERALL 15 MG tablet   modafinil (PROVIGIL) 100 MG tablet   Other Relevant Orders   TSH   CBC with Differential/Platelet   PSA   Comprehensive metabolic panel with GFR   Lipid panel   VITAMIN D  25 Hydroxy (Vit-D Deficiency, Fractures)   Testosterone    Sleep disorder   Max wake hrs - 126 h awake last month      Relevant Orders   TSH   CBC with Differential/Platelet   PSA   Comprehensive metabolic panel with GFR   Lipid panel   VITAMIN D  25 Hydroxy (Vit-D Deficiency, Fractures)   Testosterone    Vitamin B12   Hypogonadism male   On testosterone  gel Obtain labs including liver tests, CBC, PSA, testosterone  RTC 6 mo       Relevant Medications   Testosterone  20.25 MG/ACT (1.62%) GEL   Other Relevant Orders   TSH   CBC with Differential/Platelet   PSA   Comprehensive metabolic panel with GFR   Lipid panel   VITAMIN D  25 Hydroxy (Vit-D Deficiency, Fractures)   Testosterone    Vitamin B12   Fatigue   Chronic fatigue No change      ADD (attention deficit disorder)   Adderall was decreased      Tachycardia   Sinus tachycardia, chronic.  Heart rate at home is 100-112.  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  Heart rate is 68 when he is asleep according to his fitbit.  No chest pain. His father has atrial fibrillation      Hair loss   Delshon is using compounded finasteride  in minoxidil solution He had sexual side effects with oral finasteride          Meds ordered this encounter  Medications   Testosterone  20.25 MG/ACT (1.62%) GEL    Sig: PLACE 3 PUMPS ONTO THE SKIN EVERY MORNING.    Dispense:  75 g    Refill:  5    This request is for a new prescription for a controlled substance as required by Federal/State law.   Vitamin D , Ergocalciferol , (DRISDOL) 1.25 MG (50000 UNIT) CAPS capsule    Sig: Take 1 capsule (50,000 Units total) by mouth every 14 (fourteen) days.  Dispense:  6 capsule    Refill:  3     Baden is using compounded finasteride  in minoxidil solution He had sexual side effects with oral finasteride  okay to work therefore I think okay  Thank you Follow-up: Return in about 6 months (around 07/16/2024) for a follow-up visit.  Marolyn Noel, MD

## 2024-01-14 NOTE — Assessment & Plan Note (Addendum)
 On testosterone  gel Obtain labs including liver tests, CBC, PSA, testosterone  RTC 6 mo

## 2024-01-14 NOTE — Assessment & Plan Note (Signed)
Chronic fatigue.  No change

## 2024-04-07 ENCOUNTER — Ambulatory Visit: Payer: Self-pay

## 2024-04-07 NOTE — Telephone Encounter (Signed)
 FYI Only or Action Required?: FYI only for provider.  Patient was last seen in primary care on 01/14/2024 by Plotnikov, Karlynn GAILS, MD.  Called Nurse Triage reporting Hematuria.  Symptoms began today.  Interventions attempted: Nothing.  Symptoms are: completely resolved.  Triage Disposition: No disposition on file.  Patient/caregiver understands and will follow disposition?: Yes    Copied from CRM [id number]. Topic: Clinical - Red Word Triage >> Apr 07, 2024  8:43 AM Robinson DEL wrote: [facility] that prompted transfer to Nurse Triage: Blood in urine last night Additional Information  Commented on: All Negative - See Physician Within 24 Hours    Pt endorsing coughing fit 4-5 days ago and noticed some bruising in RLQ. Bruising is getting better, but unsure if its a cause relative  Answer Assessment - Initial Assessment Questions 1. COLOR of URINE: Describe the color of the urine.  (e.g., tea-colored, pink, red, bloody) Do you have blood clots in your urine? (e.g., none, pea, grape, small coin)     Didn't notice in his urine, but noticed when he dabbed and it was pink tinged  2. ONSET: When did the bleeding start?      Last night  3. EPISODES: How many times has there been blood in the urine? or How many times today?     1 time last night  4. PAIN with URINATION: Is there any pain with passing your urine? If Yes, ask: How bad is the pain?  (Scale 1-10; or mild, moderate, severe)     No  5. FEVER: Do you have a fever? If Yes, ask: What is your temperature, how was it measured, and when did it start?     No  6. ASSOCIATED SYMPTOMS: Are you passing urine more frequently than usual?     No  7. OTHER SYMPTOMS: Do you have any other symptoms? (e.g., back/flank pain, abdomen pain, vomiting)     Btrusing that is clearing up on his RLQ. Says that it developed after coughing fit 4-5 days ago. But bruise is clearing  Protocols used: Urine - Blood In-A-AH

## 2024-04-08 ENCOUNTER — Encounter: Payer: Self-pay | Admitting: Family Medicine

## 2024-04-08 ENCOUNTER — Ambulatory Visit (INDEPENDENT_AMBULATORY_CARE_PROVIDER_SITE_OTHER): Admitting: Family Medicine

## 2024-04-08 VITALS — BP 136/88 | HR 73 | Temp 98.7°F | Ht 70.0 in | Wt 309.4 lb

## 2024-04-08 DIAGNOSIS — R319 Hematuria, unspecified: Secondary | ICD-10-CM

## 2024-04-08 LAB — POCT URINALYSIS DIP (CLINITEK)
Bilirubin, UA: NEGATIVE
Blood, UA: NEGATIVE
Glucose, UA: NEGATIVE mg/dL
Ketones, POC UA: NEGATIVE mg/dL
Leukocytes, UA: NEGATIVE
Nitrite, UA: NEGATIVE
POC PROTEIN,UA: NEGATIVE
Spec Grav, UA: 1.015 (ref 1.010–1.025)
Urobilinogen, UA: 0.2 U/dL
pH, UA: 6 (ref 5.0–8.0)

## 2024-04-08 NOTE — Progress Notes (Signed)
 Acute Office Visit  Subjective:     Patient ID: Herbert Bryant, male    DOB: 1980-11-16, 43 y.o.   MRN: [id number]  Chief Complaint  Patient presents with   Acute Visit    HPI  Discussed the use of AI scribe software for clinical note transcription with the patient, who gave verbal consent to proceed.  History of Present Illness Herbert Bryant is a 43 year old male who presents with pink tinged urine.  Hematuria - Pink-tinged urine observed, visible only on toilet paper - Sensation of being able to 'smell blood' - No associated dysuria, lower abdominal pain, or back pain - No prior episodes of hematuria - History of kidney stone approximately 35 years ago  Abdominal ecchymosis - Bruise on abdomen resulting from a coughing fit six days ago - Bruise is resolving and has never been painful  Peripheral edema - Occasional pedal edema - Edema attributed to sleep schedule and prolonged periods of sitting  Lifestyle and dietary factors - No recent dietary changes - No increased intake of caffeine or acidic foods - Quit smoking approximately ten years ago - Occasionally checks blood pressure at home     ROS Per HPI      Objective:    BP 136/88 (BP Location: Right Arm, Patient Position: Sitting, Cuff Size: Large)   Pulse 73   Temp 98.7 F (37.1 C) (Temporal)   Ht 5' 10 (1.778 m)   Wt (!) 309 lb 6.4 oz (140.3 kg)   SpO2 97%   BMI 44.39 kg/m    Physical Exam Vitals and nursing note reviewed. Chaperone present: declined chaperone.  Constitutional:      General: He is not in acute distress.    Appearance: Normal appearance. He is obese.  HENT:     Head: Normocephalic and atraumatic.  Eyes:     Extraocular Movements: Extraocular movements intact.  Cardiovascular:     Rate and Rhythm: Normal rate and regular rhythm.  Pulmonary:     Effort: Pulmonary effort is normal.  Genitourinary:    Comments: Scrotal skin mildly erythematous, no lesions, no  discharge Musculoskeletal:        General: Normal range of motion.     Cervical back: Normal range of motion.     Right lower leg: No edema.     Left lower leg: No edema.  Lymphadenopathy:     Cervical: No cervical adenopathy.  Skin:    General: Skin is warm and dry.  Neurological:     General: No focal deficit present.     Mental Status: He is alert and oriented to person, place, and time.  Psychiatric:        Mood and Affect: Mood normal.        Behavior: Behavior normal.     Results for orders placed or performed in visit on 04/08/24  POCT URINALYSIS DIP (CLINITEK)  Result Value Ref Range   Color, UA yellow yellow   Clarity, UA clear clear   Glucose, UA negative negative mg/dL   Bilirubin, UA negative negative   Ketones, POC UA negative negative mg/dL   Spec Grav, UA 8.984 8.989 - 1.025   Blood, UA negative negative   pH, UA 6.0 5.0 - 8.0   POC PROTEIN,UA negative negative, trace   Urobilinogen, UA 0.2 0.2 or 1.0 E.U./dL   Nitrite, UA Negative Negative   Leukocytes, UA Negative Negative        Assessment & Plan:   Assessment  and Plan Assessment & Plan Hematuria  Intermittent hematuria with no dysuria or pain. Possible dietary irritation. Differential includes minor urethral irritation or small kidney stone, unlikely due to lack of pain. Urinalysis clean, urine sent for culture. - Send urine for culture. - Advise monitoring for recurrence of hematuria and report if it occurs again. - Discuss potential dietary factors contributing to bladder irritation, such as caffeine and acidic foods. - Reassured regarding absence of infection or significant renal involvement based on current findings.     Orders Placed This Encounter  Procedures   Urine Culture    Standing Status:   Future    Expiration Date:   05/08/2024   POCT URINALYSIS DIP (CLINITEK)     No orders of the defined types were placed in this encounter.   Return if symptoms worsen or fail to  improve.  Corean LITTIE Ku, FNP

## 2024-04-08 NOTE — Patient Instructions (Signed)
 Urine looks ok in the office today.   We will send it for culture for confirmation  We will be in contact with results that require further attention.   Follow-up with me for new or worsening symptoms.

## 2024-04-09 LAB — URINE CULTURE: Result:: NO GROWTH

## 2024-04-11 ENCOUNTER — Ambulatory Visit: Payer: Self-pay | Admitting: Family Medicine

## 2024-06-29 DIAGNOSIS — H10413 Chronic giant papillary conjunctivitis, bilateral: Secondary | ICD-10-CM | POA: Diagnosis not present

## 2024-06-29 DIAGNOSIS — H5213 Myopia, bilateral: Secondary | ICD-10-CM | POA: Diagnosis not present

## 2024-08-10 ENCOUNTER — Other Ambulatory Visit: Payer: Self-pay | Admitting: Internal Medicine

## 2024-08-10 DIAGNOSIS — E291 Testicular hypofunction: Secondary | ICD-10-CM

## 2024-08-19 ENCOUNTER — Ambulatory Visit: Payer: Self-pay

## 2024-08-19 NOTE — Telephone Encounter (Signed)
 FYI Only or Action Required?: FYI only for provider: appointment scheduled on 08/22/2024 at 8am with Corean Geralds PA-C.  Patient was last seen in primary care on 04/08/2024 by Alvia Corean CROME, FNP.  Called Nurse Triage reporting Mouth Lesions.  Symptoms began about a month ago.  Interventions attempted: Rest, hydration, or home remedies.  Symptoms are: unchanged.  Triage Disposition: See PCP When Office is Open (Within 3 Days)  Patient/caregiver understands and will follow disposition?: Yes      Message from [place] sent at 08/19/2024 12:49 PM EST  Reason for Triage: Wound on tongue for month, causing pain   Reason for Disposition  Mouth sore (ulcer) lasts > 2 weeks  Answer Assessment - Initial Assessment Questions Left side of tongue ---states ulcer from biting tongue over a month ago ---being bitten repeatedly over a few days at that time ---he states it has not gotten worse --pt states a small area that just wont completely heal  Patient states slight swelling on that side of the tongue from rubbing on side of a tooth  Patient denies: difficulty breathing, difficulty swallowing, throat swelling, or any fevers  Patient states he cannot come in today for an appointment and he wants to wait until Monday the 9th  Patient is advised to call us  back if anything changes or with any further questions/concerns. Patient is advised that if anything worsens to go to the Emergency Room. Patient verbalized understanding.  Protocols used: Mouth Ulcers-A-AH

## 2024-08-22 ENCOUNTER — Ambulatory Visit: Admitting: Emergency Medicine
